# Patient Record
Sex: Male | Born: 1955
Health system: Southern US, Community
[De-identification: ages and names within clinical notes are randomized; demographics above are authoritative.]

## PROBLEM LIST (undated history)

## (undated) DIAGNOSIS — I10 Essential (primary) hypertension: Secondary | ICD-10-CM

## (undated) DIAGNOSIS — E785 Hyperlipidemia, unspecified: Secondary | ICD-10-CM

## (undated) DIAGNOSIS — Z974 Presence of external hearing-aid: Secondary | ICD-10-CM

## (undated) DIAGNOSIS — G4730 Sleep apnea, unspecified: Secondary | ICD-10-CM

## (undated) DIAGNOSIS — T7840XA Allergy, unspecified, initial encounter: Secondary | ICD-10-CM

## (undated) DIAGNOSIS — M1990 Unspecified osteoarthritis, unspecified site: Secondary | ICD-10-CM

## (undated) DIAGNOSIS — G709 Myoneural disorder, unspecified: Secondary | ICD-10-CM

## (undated) HISTORY — DX: Hyperlipidemia, unspecified: E78.5

## (undated) HISTORY — DX: Myoneural disorder, unspecified: G70.9

## (undated) HISTORY — DX: Allergy, unspecified, initial encounter: T78.40XA

## (undated) HISTORY — DX: Unspecified osteoarthritis, unspecified site: M19.90

## (undated) HISTORY — DX: Essential (primary) hypertension: I10

## (undated) HISTORY — PX: CATARACT EXTRACTION: SUR2

## (undated) HISTORY — PX: TYMPANOSTOMY TUBE PLACEMENT: SHX32

## (undated) HISTORY — PX: COLONOSCOPY WITH PROPOFOL: SHX5780

---

## 1995-03-26 ENCOUNTER — Encounter: Payer: Self-pay | Admitting: Family Medicine

## 1995-03-26 LAB — CONVERTED CEMR LAB
Blood Glucose, Fasting: 92 mg/dL
RBC count: 4.7 10*6/uL
WBC, blood: 5.2 10*3/uL

## 1995-04-18 HISTORY — PX: VASECTOMY: SHX75

## 1999-06-10 ENCOUNTER — Encounter: Payer: Self-pay | Admitting: Family Medicine

## 1999-06-10 LAB — CONVERTED CEMR LAB: Blood Glucose, Fasting: 107 mg/dL

## 2001-09-13 ENCOUNTER — Encounter: Payer: Self-pay | Admitting: Family Medicine

## 2001-09-13 LAB — CONVERTED CEMR LAB: TSH: 1.16 microintl units/mL

## 2003-01-02 ENCOUNTER — Encounter: Payer: Self-pay | Admitting: Family Medicine

## 2003-04-18 HISTORY — PX: LASIK: SHX215

## 2004-06-09 ENCOUNTER — Ambulatory Visit: Payer: Self-pay | Admitting: Family Medicine

## 2004-06-09 LAB — CONVERTED CEMR LAB
PSA: 0.42 ng/mL
TSH: 1.58 microintl units/mL

## 2004-06-15 ENCOUNTER — Ambulatory Visit: Payer: Self-pay | Admitting: Family Medicine

## 2004-08-10 ENCOUNTER — Ambulatory Visit: Payer: Self-pay | Admitting: Family Medicine

## 2004-08-10 LAB — FECAL OCCULT BLOOD, GUAIAC: Fecal Occult Blood: NEGATIVE

## 2005-01-30 ENCOUNTER — Ambulatory Visit: Payer: Self-pay | Admitting: Family Medicine

## 2005-06-28 ENCOUNTER — Ambulatory Visit: Payer: Self-pay | Admitting: Family Medicine

## 2005-06-28 LAB — CONVERTED CEMR LAB
PSA: 0.48 ng/mL
TSH: 1.69 microintl units/mL

## 2005-07-03 ENCOUNTER — Ambulatory Visit: Payer: Self-pay | Admitting: Family Medicine

## 2006-07-10 ENCOUNTER — Ambulatory Visit: Payer: Self-pay | Admitting: Family Medicine

## 2006-07-10 LAB — CONVERTED CEMR LAB
Albumin: 3.9 g/dL (ref 3.5–5.2)
Bilirubin, Direct: 0.1 mg/dL (ref 0.0–0.3)
Creatinine,U: 183.8 mg/dL
GFR calc non Af Amer: 83 mL/min
Glucose, Bld: 108 mg/dL — ABNORMAL HIGH (ref 70–99)
HDL: 39.8 mg/dL (ref >39.0)
LDL Cholesterol: 117 mg/dL — ABNORMAL HIGH (ref 0–99)
Microalb, Ur: 0.8 mg/dL (ref 0.0–1.9)
PSA: 0.48 ng/mL
PSA: 0.48 ng/mL (ref 0.10–4.00)
Potassium: 4.3 meq/L (ref 3.5–5.1)
Sodium: 139 meq/L (ref 135–145)
TSH: 1.59 microintl units/mL (ref 0.35–5.50)
Total Bilirubin: 0.7 mg/dL (ref 0.3–1.2)
Triglycerides: 110 mg/dL (ref 0–149)
VLDL: 22 mg/dL (ref 0–40)

## 2006-07-12 ENCOUNTER — Ambulatory Visit: Payer: Self-pay | Admitting: Family Medicine

## 2007-02-19 ENCOUNTER — Encounter: Payer: Self-pay | Admitting: Family Medicine

## 2007-02-19 DIAGNOSIS — E785 Hyperlipidemia, unspecified: Secondary | ICD-10-CM | POA: Insufficient documentation

## 2007-02-19 DIAGNOSIS — I10 Essential (primary) hypertension: Secondary | ICD-10-CM | POA: Insufficient documentation

## 2007-02-19 DIAGNOSIS — J309 Allergic rhinitis, unspecified: Secondary | ICD-10-CM

## 2007-02-19 DIAGNOSIS — R7309 Other abnormal glucose: Secondary | ICD-10-CM | POA: Insufficient documentation

## 2007-09-05 ENCOUNTER — Ambulatory Visit: Payer: Self-pay | Admitting: Family Medicine

## 2007-09-05 LAB — CONVERTED CEMR LAB
ALT: 23 units/L (ref 0–53)
AST: 20 units/L (ref 0–37)
Alkaline Phosphatase: 87 units/L (ref 39–117)
Bilirubin, Direct: 0.1 mg/dL (ref 0.0–0.3)
CO2: 28 meq/L (ref 19–32)
Chloride: 102 meq/L (ref 96–112)
GFR calc non Af Amer: 74 mL/min
LDL Cholesterol: 132 mg/dL — ABNORMAL HIGH (ref 0–99)
PSA: 0.44 ng/mL (ref 0.10–4.00)
Potassium: 4.5 meq/L (ref 3.5–5.1)
Sodium: 138 meq/L (ref 135–145)
TSH: 1.35 microintl units/mL (ref 0.35–5.50)
Total Bilirubin: 0.6 mg/dL (ref 0.3–1.2)
Total CHOL/HDL Ratio: 5.7
Triglycerides: 86 mg/dL (ref 0–149)

## 2007-09-12 ENCOUNTER — Ambulatory Visit: Payer: Self-pay | Admitting: Family Medicine

## 2008-09-09 ENCOUNTER — Ambulatory Visit: Payer: Self-pay | Admitting: Family Medicine

## 2008-09-10 LAB — CONVERTED CEMR LAB
Albumin: 3.9 g/dL (ref 3.5–5.2)
BUN: 15 mg/dL (ref 6–23)
Basophils Absolute: 0 10*3/uL (ref 0.0–0.1)
CO2: 30 meq/L (ref 19–32)
Calcium: 8.9 mg/dL (ref 8.4–10.5)
Cholesterol: 167 mg/dL (ref 0–200)
Creatinine,U: 170.6 mg/dL
Eosinophils Absolute: 0.1 10*3/uL (ref 0.0–0.7)
GFR calc non Af Amer: 93.27 mL/min (ref >60)
Glucose, Bld: 97 mg/dL (ref 70–99)
HCT: 41.3 % (ref 39.0–52.0)
HDL: 40.3 mg/dL (ref >39.00)
Hemoglobin: 14.5 g/dL (ref 13.0–17.0)
Lymphocytes Relative: 30.8 % (ref 12.0–46.0)
Lymphs Abs: 1.8 10*3/uL (ref 0.7–4.0)
MCHC: 35.2 g/dL (ref 30.0–36.0)
Microalb Creat Ratio: 5.3 mg/g (ref 0.0–30.0)
Microalb, Ur: 0.9 mg/dL (ref 0.0–1.9)
Monocytes Relative: 8.9 % (ref 3.0–12.0)
Neutro Abs: 3.5 10*3/uL (ref 1.4–7.7)
Platelets: 219 10*3/uL (ref 150.0–400.0)
RDW: 12.5 % (ref 11.5–14.6)
Sodium: 142 meq/L (ref 135–145)
TSH: 1.93 microintl units/mL (ref 0.35–5.50)
Total Bilirubin: 0.7 mg/dL (ref 0.3–1.2)
VLDL: 22.8 mg/dL (ref 0.0–40.0)

## 2008-09-16 ENCOUNTER — Ambulatory Visit: Payer: Self-pay | Admitting: Family Medicine

## 2009-10-08 ENCOUNTER — Telehealth: Payer: Self-pay | Admitting: Family Medicine

## 2009-11-05 ENCOUNTER — Telehealth (INDEPENDENT_AMBULATORY_CARE_PROVIDER_SITE_OTHER): Payer: Self-pay | Admitting: *Deleted

## 2009-11-08 ENCOUNTER — Ambulatory Visit: Payer: Self-pay | Admitting: Family Medicine

## 2009-11-09 LAB — CONVERTED CEMR LAB
AST: 22 units/L (ref 0–37)
Albumin: 4 g/dL (ref 3.5–5.2)
BUN: 17 mg/dL (ref 6–23)
Calcium: 9.2 mg/dL (ref 8.4–10.5)
Cholesterol: 178 mg/dL (ref 0–200)
GFR calc non Af Amer: 88.32 mL/min (ref >60)
Glucose, Bld: 102 mg/dL — ABNORMAL HIGH (ref 70–99)
VLDL: 30.8 mg/dL (ref 0.0–40.0)

## 2009-11-11 ENCOUNTER — Encounter: Payer: Self-pay | Admitting: Gastroenterology

## 2009-11-11 ENCOUNTER — Ambulatory Visit: Payer: Self-pay | Admitting: Family Medicine

## 2009-11-11 LAB — CONVERTED CEMR LAB

## 2009-12-06 ENCOUNTER — Encounter (INDEPENDENT_AMBULATORY_CARE_PROVIDER_SITE_OTHER): Payer: Self-pay | Admitting: *Deleted

## 2009-12-09 ENCOUNTER — Ambulatory Visit: Payer: Self-pay | Admitting: Gastroenterology

## 2009-12-23 ENCOUNTER — Ambulatory Visit: Payer: Self-pay | Admitting: Gastroenterology

## 2009-12-27 ENCOUNTER — Encounter: Payer: Self-pay | Admitting: Gastroenterology

## 2010-05-17 NOTE — Letter (Signed)
Summary: Patient Notice- Polyp Results  [facility]  [address] [place], [place] [address]   Phone: [phone]  Fax: [phone]        December 27, 2009 MRN: [id number]    PINKNEY VENARD [address] [place], [place]  [address]    Dear Mr. Northway,  I am pleased to inform you that the colon polyp(s) removed during your recent colonoscopy was (were) found to be benign (no cancer detected) upon pathologic examination.  I recommend you have a repeat colonoscopy examination in 5 years to look for recurrent polyps, as having colon polyps increases your risk for having recurrent polyps or even colon cancer in the future.  Should you develop new or worsening symptoms of abdominal pain, bowel habit changes or bleeding from the rectum or bowels, please schedule an evaluation with either your primary care physician or with me.  Continue treatment plan as outlined the day of your exam.  Please call us if you are having persistent problems or have questions about your condition that have not been fully answered at this time.  Sincerely,  Meryl Dare MD [facility]  This letter has been electronically signed by your physician.  Appended Document: Patient Notice- Polyp Results letter mailed.

## 2010-05-17 NOTE — Assessment & Plan Note (Signed)
Summary: cpx and transfer from schaller/alc   Vital Signs:  Patient profile:   55 year old male Height:      67.50 inches Weight:      262.75 pounds BMI:     40.69 Temp:     98.1 degrees F oral Pulse rate:   84 / minute Pulse rhythm:   regular BP sitting:   142 / 90  (left arm) Cuff size:   large  Vitals Entered By: Janee Morn CMA (2009-11-18 11:25 AM) CC: CPx and transfer from Dr. Hetty Ely, Preventive Care   History of Present Illness: CPE= see plan.  Hypertension:      Using medication without problems or lightheadedness: yes Chest pain with exertion:no Edema:no Short of breath:no Average home BPs:not checked Other issues: none  labs reviewed with patient.  d/w patient re: hyperglycemia.   Allergies: 1)  ! Erythromycin  Past History:  Past Medical History: Last updated: 02/19/2007 Allergic rhinitis:(03-04-1995) Hyperlipidemia(05/1999) Hypertension:(08/2001)  Past Surgical History: Last updated: 09/16/2008 VASECTOMY (DR SCHALLER) 04/1995 CARDIOLITE NML EF 64%:(11/15/2001)  Family History: Last updated: 11-18-2009 Father:DECEASED 38 YOA, CAD, STROKE,PROSTATE CANCER  Mother: A 90 Htn Chol   BROTHER A 68  KNEE SURGERY  ELEVATED PSA SISTER A 64  Smoker COPD on chronic steroids SISTER A 51 CV: + CAD FATHER, FATHERS BROTHER AORTIC ANEURYSM HBP: + FATHER DIABETES: +UNCLE, + PGF GOUT/ARTHRITIS: CANCER: + FATHER PROSTATE  Social History: Last updated: 2009/11/18 Marital Status: Married Research officer, 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 no illicits prev golfer  Risk Factors: Alcohol Use: <1 (09/16/2008) Caffeine Use: 4 (09/16/2008) Exercise: no (09/16/2008)  Risk Factors: Smoking Status: never (09/16/2008) Passive Smoke Exposure: no (09/16/2008)  Family  History: Reviewed history from 09/16/2008 and no changes required. Father:DECEASED 39 YOA, CAD, STROKE,PROSTATE CANCER  Mother: A 90 Htn Chol   BROTHER A 68  KNEE SURGERY  ELEVATED PSA SISTER A 64  Smoker COPD on chronic steroids SISTER A 51 CV: + CAD FATHER, FATHERS BROTHER AORTIC ANEURYSM HBP: + FATHER DIABETES: +UNCLE, + PGF GOUT/ARTHRITIS: CANCER: + FATHER PROSTATE  Social History: Reviewed history from 02/19/2007 and no changes required. 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 no illicits prev golfer  Review of Systems       See HPI.  Otherwise negative.    Physical Exam  General:  GEN: nad, alert and oriented HEENT: mucous membranes moist NECK: supple w/o LA CV: rrr.  no murmur PULM: ctab, no inc wob ABD: soft, +bs EXT: no edema SKIN: no acute rash  Rectal:  No external abnormalities noted. Normal sphincter tone. No rectal masses or tenderness.  Stool heme neg by MD Prostate:  Prostate gland firm and smooth, no enlargement, nodularity, tenderness, mass, asymmetry or induration.   Impression & Recommendations:  Problem # 1:  HEALTH MAINTENANCE EXAM (ICD-V70.0) See below.  flu shot encouraged.   Problem # 2:  HYPERTENSION (ICD-401.9) No change in meds.  D/w patient UE:AVWUJW and exercise.  His updated medication list for this problem includes:    Toprol Xl 50 Mg Tb24 (Metoprolol succinate) .Marland Kitchen... 1 by mouth qday  Problem # 3:  HYPERGLYCEMIA (ICD-790.29)  Follow yearly and patient to work on weight via exercise.   Problem # 4:  ALLERGIC RHINITIS (ICD-477.9) No change in meds.  controlled.  His updated medication list for this problem includes:    Fluticasone Propionate 50 Mcg/act Susp (Fluticasone propionate) ..... One puff to each nostril  daily  Complete Medication List: 1)  Fluticasone Propionate 50 Mcg/act Susp (Fluticasone propionate) .... One puff to each nostril daily 2)  Toprol Xl 50 Mg Tb24 (Metoprolol succinate) .Marland Kitchen.. 1 by mouth qday  Other Orders: Gastroenterology Referral (GI)  Colorectal Screening:  Current Recommendations:    Hemoccult: NEG X 1 today    Colonoscopy recommended: scheduled with G.I.  PSA Screening:    PSA: 0.53  (11/08/2009)    Reviewed PSA screening recommendations: PSA ordered  Immunization & Chemoprophylaxis:    Tetanus vaccine: Td  (09/30/2001)  Patient Instructions: 1)  Take care. Glad to see you today.  I would recheck your sugar and other labs before a physical next year.  See Aram Beecham about your referral before your leave today.   Work on your exercise routine.   Prescriptions: TOPROL XL 50 MG  TB24 (METOPROLOL SUCCINATE) 1 by mouth qday  #90 x 3   Entered and Authorized by:   Crawford Givens MD   Signed by:   Crawford Givens MD on 11/11/2009   Method used:   Electronically to        [facility] [address] (retail)       [address] [place], [place]  [address]       Ph: [phone]       Fax: [phone]   RxID:   [id number] FLUTICASONE PROPIONATE 50 MCG/ACT  SUSP (FLUTICASONE PROPIONATE) ONE PUFF TO EACH NOSTRIL DAILY  #3 x 3   Entered and Authorized by:   Crawford Givens MD   Signed by:   Crawford Givens MD on 11/11/2009   Method used:   Electronically to        [facility] [address] (retail)       [address] [place], [place]  [address]       Ph: [phone]       Fax: [phone]   RxID:   [id number]   Current Allergies (reviewed today): ! ERYTHROMYCIN      Prevention & Chronic Care Immunizations   Influenza vaccine: Not documented   Influenza vaccine due: 12/16/2009    Tetanus booster: 09/30/2001: Td    Pneumococcal vaccine: Not documented   Pneumococcal vaccine due:  02/15/2019  Colorectal Screening   Hemoccult: Negative  (08/10/2004)   Hemoccult action/deferral: NEG X 1 today  (11/11/2009)    Colonoscopy: Not documented   Colonoscopy action/deferral: scheduled with G.I.  (11/11/2009)  Other Screening   PSA: 0.53  (11/08/2009)   PSA action/deferral: PSA ordered  (11/11/2009)   Smoking status: never  (09/16/2008)  Lipids   Total Cholesterol: 178  (11/08/2009)   LDL: 109  (11/08/2009)   LDL Direct: Not documented   HDL: 38.70  (11/08/2009)   Triglycerides: 154.0  (11/08/2009)    SGOT (AST): 22  (11/08/2009)   SGPT (ALT): 24  (11/08/2009)   Alkaline phosphatase: 75  (11/08/2009)   Total bilirubin: 0.7  (11/08/2009)    Lipid flowsheet reviewed?: Yes  Hypertension   Last Blood Pressure: 142 /  90  (11/11/2009)   Serum creatinine: 0.9  (11/08/2009)   Serum potassium 4.2  (11/08/2009)    Hypertension flowsheet reviewed?: Yes  Self-Management Support :    Hypertension self-management support: Not documented    Lipid self-management support: Not documented

## 2010-05-17 NOTE — Letter (Signed)
Summary: [facility] Instructions  [facility]  [address] [place], [place] [address]   Phone: [phone]  Fax: [phone]       LINKEN MCGLOTHEN    1955-06-24    MRN: [id number]        Procedure Day Dorna Bloom:  Lenor Coffin  12/23/09     Arrival Time:  9:30AM     Procedure Time:  10:30AM     Location of Procedure:                    _ X_  [facility] (4th Floor)                       PREPARATION FOR COLONOSCOPY WITH MOVIPREP   Starting 5 days prior to your procedure 12/18/09 do not eat nuts, seeds, popcorn, corn, beans, peas,  salads, or any raw vegetables.  Do not take any fiber supplements (e.g. Metamucil, Citrucel, and Benefiber).  THE DAY BEFORE YOUR PROCEDURE         DATE: 12/22/09  DAY: WEDNESDAY  1.  Drink clear liquids the entire day-NO SOLID FOOD  2.  Do not drink anything colored red or purple.  Avoid juices with pulp.  No orange juice.  3.  Drink at least 64 oz. (8 glasses) of fluid/clear liquids during the day to prevent dehydration and help the prep work efficiently.  CLEAR LIQUIDS INCLUDE: Water Jello Ice Popsicles Tea (sugar ok, no milk/cream) Powdered fruit flavored drinks Coffee (sugar ok, no milk/cream) Gatorade Juice: apple, white grape, white cranberry  Lemonade Clear bullion, consomm, broth Carbonated beverages (any kind) Strained chicken noodle soup Hard Candy                             4.  In the morning, mix first dose of MoviPrep solution:    Empty 1 Pouch A and 1 Pouch B into the disposable container    Add lukewarm drinking water to the top line of the container. Mix to dissolve    Refrigerate (mixed solution should be used within 24 hrs)  5.  Begin drinking the prep at 5:00 p.m. The MoviPrep container is divided by 4 marks.   Every 15 minutes drink the solution down to the next mark (approximately 8 oz) until the full liter is complete.   6.  Follow completed prep with 16 oz of clear liquid of your choice (Nothing  red or purple).  Continue to drink clear liquids until bedtime.  7.  Before going to bed, mix second dose of MoviPrep solution:    Empty 1 Pouch A and 1 Pouch B into the disposable container    Add lukewarm drinking water to the top line of the container. Mix to dissolve    Refrigerate  THE DAY OF YOUR PROCEDURE      DATE: 12/23/09  DAY: THURSDAY  Beginning at 5:30AM (5 hours before procedure):         1. Every 15 minutes, drink the solution down to the next mark (approx 8 oz) until the full liter is complete.  2. Follow completed prep with 16 oz. of clear liquid of your choice.    3. You may drink clear liquids until 8:30AM (2 HOURS BEFORE PROCEDURE).   MEDICATION INSTRUCTIONS  Unless otherwise instructed, you should take regular prescription medications with a small sip of water   as early as possible the morning of  your procedure.           OTHER INSTRUCTIONS  You will need a responsible adult at least 57 years of age to accompany you and drive you home.   This person must remain in the waiting room during your procedure.  Wear loose fitting clothing that is easily removed.  Leave jewelry and other valuables at home.  However, you may wish to bring a book to read or  an iPod/MP3 player to listen to music as you wait for your procedure to start.  Remove all body piercing jewelry and leave at home.  Total time from sign-in until discharge is approximately 2-3 hours.  You should go home directly after your procedure and rest.  You can resume normal activities the  day after your procedure.  The day of your procedure you should not:   Drive   Make legal decisions   Operate machinery   Drink alcohol   Return to work  You will receive specific instructions about eating, activities and medications before you leave.    The above instructions have been reviewed and explained to me by   Clide Cliff, RN_____________________    I fully understand and can  verbalize these instructions _____________________________ Date _________

## 2010-05-17 NOTE — Progress Notes (Signed)
Summary: congrats  Phone Note Call from Patient Call back at Home Phone [phone]   Caller: Patient Call For: Shaune Leeks MD Summary of Call: Patient called to set up appt. with Dr. Para March for transfer and cpx. He wanted me to tell you congrats and happy retirement on his behalf.  Initial call taken by: Melody Comas,  October 08, 2009 9:34 AM  Follow-up for Phone Call        Noted. Follow-up by: Shaune Leeks MD,  October 10, 2009 1:44 PM

## 2010-05-17 NOTE — Procedures (Signed)
Summary: Colonoscopy  Patient: Lot Medford Note: All result statuses are Final unless otherwise noted.  Tests: (1) Colonoscopy (COL)   COL Colonoscopy           DONE     [facility]     [address]     [place], [place]  [address]           COLONOSCOPY PROCEDURE REPORT           PATIENT:  Dustin, [name]  MR#:  [id number]     BIRTHDATE:  Aug 16, 1955, 53 yrs. old  GENDER:  male     ENDOSCOPIST:  Judie Petit T. Russella Dar, MD, [facility]     Referred by:  Crawford Givens, M.D.     PROCEDURE DATE:  12/23/2009     PROCEDURE:  Colonoscopy with snare polypectomy     ASA CLASS:  Class II     INDICATIONS:  1) Routine Risk Screening     MEDICATIONS:   Fentanyl 75 mcg IV, Versed 7 mg IV     DESCRIPTION OF PROCEDURE:   After the risks benefits and     alternatives of the procedure were thoroughly explained, informed     consent was obtained.  Digital rectal exam was performed and     revealed no abnormalities.   The LB PCF-[id number] endoscope     was introduced through the anus and advanced to the cecum, which     was identified by both the appendix and ileocecal valve, without     limitations.  The quality of the prep was excellent, using     MoviPrep.  The instrument was then slowly withdrawn as the colon     was fully examined.     <<PROCEDUREIMAGES>>     FINDINGS:  Two polyps were found in the sigmoid colon. They were 4     - 5 mm in size. Polyps were snared without cautery. Retrieval was     successful. Moderate diverticulosis was found in the sigmoid to     descending colon.  A normal appearing cecum, ileocecal valve, and     appendiceal orifice were identified. The ascending, hepatic     flexure, transverse, splenic flexure, and rectum appeared     unremarkable. Retroflexed views in the rectum revealed no     abnormalities.  The time to cecum =  2.75  minutes. The scope was     then withdrawn (time =  11.25  min) from the patient and the     procedure completed.     COMPLICATIONS:   None           ENDOSCOPIC IMPRESSION:     1) 4 - 5 mm Two polyps in the sigmoid colon     2) Moderate diverticulosis in the sigmoid to descending colon           RECOMMENDATIONS:     1) Await pathology results     2) High fiber diet with liberal fluid intake.     3) If the polyps are adenomatous (pre-cancerous), you will need     a repeat colonoscopy in 5 years. Otherwise  continue to follow     colorectal cancer screening for "routine risk" patients with     colonoscopy in 10 years.           Venita Lick. Russella Dar, MD, [facility]           n.     eSIGNED:   Venita Lick. Russella Dar at  12/23/2009 11:42 AM           Nathanial Rancher, [id number]  Note: An exclamation mark (!) indicates a result that was not dispersed into the flowsheet. Document Creation Date: 12/23/2009 11:43 AM _______________________________________________________________________  (1) Order result status: Final Collection or observation date-time: 12/23/2009 11:37 Requested date-time:  Receipt date-time:  Reported date-time:  Referring Physician:   Ordering Physician: Claudette Head [phone]) Specimen Source:  Source: Launa Grill Order Number: [id number] Lab site:   Appended Document: Colonoscopy     Procedures Next Due Date:    Colonoscopy: 12/2014

## 2010-05-17 NOTE — Letter (Signed)
Summary: Previsit letter  [facility]  [address] [place], [place] [address]   Phone: [phone]  Fax: [phone]       11/11/2009 MRN: [id number]  Dustin [name] [address] [place], [place]  [address]  Dear [name],  Welcome to the Gastroenterology Division at [facility].    You are scheduled to see a nurse for your pre-procedure visit on 12-09-09 at 4pm on the 3rd floor at [facility], [address]  We ask that you try to arrive at our office 15 minutes prior to your appointment time to allow for check-in.  Your nurse visit will consist of discussing your medical and surgical history, your immediate family medical history, and your medications.    Please bring a complete list of all your medications or, if you prefer, bring the medication bottles and we will list them.  We will need to be aware of both prescribed and over the counter drugs.  We will need to know exact dosage information as well.  If you are on blood thinners (Coumadin, Plavix, Aggrenox, Ticlid, etc.) please call our office today/prior to your appointment, as we need to consult with your physician about holding your medication.   Please be prepared to read and sign documents such as consent forms, a financial agreement, and acknowledgement forms.  If necessary, and with your consent, a friend or relative is welcome to sit-in on the nurse visit with you.  Please bring your insurance card so that we may make a copy of it.  If your insurance requires a referral to see a specialist, please bring your referral form from your primary care physician.  No co-pay is required for this nurse visit.     If you cannot keep your appointment, please call [phone] to cancel or reschedule prior to your appointment date.  This allows [place] the opportunity to schedule an appointment for another patient in need of care.    Thank you for choosing [facility] for your medical needs.  We  appreciate the opportunity to care for you.  Please visit [place] at our website  to learn more about our practice.                     Sincerely.                                                                                                                   The Gastroenterology Division

## 2010-05-17 NOTE — Miscellaneous (Signed)
Summary: DIRECT COLON SCREEN-AGE/YF  Clinical Lists Changes  Medications: Added new medication of MOVIPREP 100 GM  SOLR (PEG-KCL-NACL-NASULF-NA ASC-C) As directed - Signed Rx of MOVIPREP 100 GM  SOLR (PEG-KCL-NACL-NASULF-NA ASC-C) As directed;  #1 x 0;  Signed;  Entered by: Clide Cliff RN;  Authorized by: Meryl Dare MD [facility];  Method used: Electronically to [facility] [address] [place], [place], [place]  [address], Ph: [phone], Fax: [phone] Observations: Added new observation of ALLERGY REV: Done (12/09/2009 16:00)    Prescriptions: MOVIPREP 100 GM  SOLR (PEG-KCL-NACL-NASULF-NA ASC-C) As directed  #1 x 0   Entered by:   Clide Cliff RN   Authorized by:   Meryl Dare MD [facility]   Signed by:   Clide Cliff RN on 12/09/2009   Method used:   Electronically to        [facility] [address] (retail)       [address] [place], [place]  [address]       Ph: [phone]       Fax: [phone]   RxID:   [id number]

## 2010-05-17 NOTE — Progress Notes (Signed)
----   Converted from flag ---- ---- 11/05/2009 12:15 PM, Crawford Givens MD wrote: psa v76.44 cmet/lipid 401.1  ---- 11/05/2009 11:42 AM, Mills Koller wrote: Patient is scheduled for a cpx w/ you. I need lab orders w/dx, Please. Thanks, Terri ------------------------------

## 2010-09-02 ENCOUNTER — Encounter: Payer: Self-pay | Admitting: Family Medicine

## 2010-09-05 ENCOUNTER — Encounter: Payer: Self-pay | Admitting: Family Medicine

## 2010-09-05 ENCOUNTER — Ambulatory Visit (INDEPENDENT_AMBULATORY_CARE_PROVIDER_SITE_OTHER): Payer: Managed Care, Other (non HMO) | Admitting: Family Medicine

## 2010-09-05 VITALS — BP 130/86 | HR 84 | Temp 98.0°F | Wt 263.1 lb

## 2010-09-05 DIAGNOSIS — R350 Frequency of micturition: Secondary | ICD-10-CM

## 2010-09-05 DIAGNOSIS — R300 Dysuria: Secondary | ICD-10-CM

## 2010-09-05 LAB — POCT URINALYSIS DIPSTICK
Bilirubin, UA: NEGATIVE
Leukocytes, UA: NEGATIVE
Nitrite, UA: NEGATIVE
Urobilinogen, UA: NEGATIVE
pH, UA: 6

## 2010-09-05 NOTE — Assessment & Plan Note (Addendum)
Hold on meds as exam is benign and check ucx.  D/w pt about caffeine.  Will recheck u/a if ucx negative, esp if sx continue.

## 2010-09-05 NOTE — Progress Notes (Signed)
Frequent urination.  Going on intermittently.  Some frequency and urgency.  Occ mild burning.  Occ nocturia.  It was more noticeable about 2 weeks ago.  He cut down on coffee with some relief of sx in the meantime.    Meds, vitals, and allergies reviewed.   ROS: See HPI.  Otherwise, noncontributory.  nad ncat Mmm rrr ctab abd soft, not ttp, no rebound Prostate gland firm and smooth, no sig enlargement, no nodularity, tenderness, mass, asymmetry or induration.  U/a reviewed.

## 2010-09-05 NOTE — Patient Instructions (Signed)
We'll contact you with your lab report. Take care.  Let us know if you have other concerns.

## 2010-09-13 ENCOUNTER — Ambulatory Visit: Payer: Managed Care, Other (non HMO) | Admitting: Family Medicine

## 2010-09-22 ENCOUNTER — Ambulatory Visit (INDEPENDENT_AMBULATORY_CARE_PROVIDER_SITE_OTHER): Payer: Managed Care, Other (non HMO) | Admitting: Family Medicine

## 2010-09-22 DIAGNOSIS — R300 Dysuria: Secondary | ICD-10-CM

## 2010-09-22 DIAGNOSIS — R358 Other polyuria: Secondary | ICD-10-CM

## 2010-09-22 LAB — POCT URINALYSIS DIPSTICK
Bilirubin, UA: NEGATIVE
Ketones, UA: NEGATIVE
Spec Grav, UA: 1.02
pH, UA: 6

## 2010-09-22 NOTE — Progress Notes (Signed)
  Subjective:    Patient ID: Dustin [name], male    DOB: 1956-01-16, 55 y.o.   MRN: [id number]  HPI    Review of Systems     Objective:   Physical Exam        Assessment & Plan:  Will await send out micro.

## 2010-09-22 NOTE — Progress Notes (Signed)
UA done and urine sent out for micro as directed.

## 2010-09-22 NOTE — Progress Notes (Signed)
Addended by: Baldomero Lamy on: 09/22/2010 10:11 AM   Modules accepted: Orders

## 2010-09-23 LAB — URINALYSIS, MICROSCOPIC ONLY
Bacteria, UA: NONE SEEN
Casts: NONE SEEN

## 2010-10-15 ENCOUNTER — Other Ambulatory Visit: Payer: Self-pay | Admitting: *Deleted

## 2010-10-15 MED ORDER — METOPROLOL SUCCINATE ER 50 MG PO TB24: 50.0000 mg | ORAL_TABLET | Freq: Every day | ORAL | Status: DC

## 2010-11-09 ENCOUNTER — Other Ambulatory Visit: Payer: Self-pay | Admitting: Family Medicine

## 2010-11-09 DIAGNOSIS — Z125 Encounter for screening for malignant neoplasm of prostate: Secondary | ICD-10-CM

## 2010-11-09 DIAGNOSIS — I10 Essential (primary) hypertension: Secondary | ICD-10-CM

## 2010-11-11 ENCOUNTER — Other Ambulatory Visit: Payer: Self-pay

## 2010-11-11 ENCOUNTER — Encounter: Payer: Managed Care, Other (non HMO) | Admitting: Family Medicine

## 2010-11-15 ENCOUNTER — Encounter: Payer: Self-pay | Admitting: Family Medicine

## 2010-11-15 ENCOUNTER — Other Ambulatory Visit (INDEPENDENT_AMBULATORY_CARE_PROVIDER_SITE_OTHER): Payer: Managed Care, Other (non HMO) | Admitting: Family Medicine

## 2010-11-15 DIAGNOSIS — Z125 Encounter for screening for malignant neoplasm of prostate: Secondary | ICD-10-CM

## 2010-11-15 DIAGNOSIS — I10 Essential (primary) hypertension: Secondary | ICD-10-CM

## 2010-11-15 LAB — COMPREHENSIVE METABOLIC PANEL
Albumin: 4 g/dL (ref 3.5–5.2)
Alkaline Phosphatase: 85 U/L (ref 39–117)
CO2: 27 mEq/L (ref 19–32)
Chloride: 101 mEq/L (ref 96–112)
Glucose, Bld: 98 mg/dL (ref 70–99)
Potassium: 4.3 mEq/L (ref 3.5–5.1)
Sodium: 137 mEq/L (ref 135–145)
Total Protein: 6.8 g/dL (ref 6.0–8.3)

## 2010-11-15 LAB — PSA: PSA: 0.51 ng/mL (ref 0.10–4.00)

## 2010-11-15 LAB — LIPID PANEL
Cholesterol: 157 mg/dL (ref 0–200)
LDL Cholesterol: 99 mg/dL (ref 0–99)
Triglycerides: 82 mg/dL (ref 0.0–149.0)

## 2010-11-18 ENCOUNTER — Ambulatory Visit (INDEPENDENT_AMBULATORY_CARE_PROVIDER_SITE_OTHER): Payer: Managed Care, Other (non HMO) | Admitting: Family Medicine

## 2010-11-18 ENCOUNTER — Encounter: Payer: Self-pay | Admitting: Family Medicine

## 2010-11-18 DIAGNOSIS — Z0000 Encounter for general adult medical examination without abnormal findings: Secondary | ICD-10-CM

## 2010-11-18 DIAGNOSIS — Z8042 Family history of malignant neoplasm of prostate: Secondary | ICD-10-CM

## 2010-11-18 DIAGNOSIS — E785 Hyperlipidemia, unspecified: Secondary | ICD-10-CM

## 2010-11-18 DIAGNOSIS — I10 Essential (primary) hypertension: Secondary | ICD-10-CM

## 2010-11-18 MED ORDER — METOPROLOL SUCCINATE ER 50 MG PO TB24: 50.0000 mg | ORAL_TABLET | Freq: Every day | ORAL | Status: DC

## 2010-11-18 MED ORDER — FLUTICASONE PROPIONATE 50 MCG/ACT NA SUSP: NASAL | Status: DC

## 2010-11-18 NOTE — Progress Notes (Signed)
CPE- See plan.  Routine anticipatory guidance given to patient.  See health maintenance.  He's going to work on exercise this fall.   Hypertension:    Using medication without problems or lightheadedness: yes Chest pain with exertion:no Edema:no Short of breath:no  Prev dysuria resolved.  Feeling well.    PMH and SH reviewed  Meds, vitals, and allergies reviewed.   ROS: See HPI.  Otherwise negative.    GEN: nad, alert and oriented HEENT: mucous membranes moist NECK: supple w/o LA CV: rrr. PULM: ctab, no inc wob ABD: soft, +bs EXT: no edema SKIN: no acute rash Prostate gland firm and smooth, no enlargement, nodularity, tenderness, mass, asymmetry or induration.

## 2010-11-18 NOTE — Patient Instructions (Addendum)
Check with your insurance to see if they will cover the shingles shot. I would get a flu shot each fall.   Take care and try to walk more this fall.  Recheck labs at physical next year.  Glad to see you today.

## 2010-11-19 ENCOUNTER — Encounter: Payer: Self-pay | Admitting: Family Medicine

## 2010-11-19 NOTE — Assessment & Plan Note (Signed)
At goal.  

## 2010-11-19 NOTE — Assessment & Plan Note (Signed)
Colonoscopy up to date.  D/w pt about diet/exercise/flu shot.  Tetanus up to date.  Healthy habits encouraged.  Labs d/w pt.

## 2010-11-19 NOTE — Assessment & Plan Note (Signed)
PSA and exam wnl.  Recheck yearly.

## 2010-11-19 NOTE — Assessment & Plan Note (Signed)
Controlled , no change in meds 

## 2011-03-13 ENCOUNTER — Other Ambulatory Visit: Payer: Self-pay | Admitting: Family Medicine

## 2011-03-13 NOTE — Telephone Encounter (Signed)
E-Scribing Error.  Med phoned to pharmacy.

## 2011-03-13 NOTE — Telephone Encounter (Signed)
E-Scribing Error.  Med phoned to pharmacy. 

## 2011-03-29 ENCOUNTER — Other Ambulatory Visit: Payer: Self-pay | Admitting: *Deleted

## 2011-03-29 MED ORDER — METOPROLOL SUCCINATE ER 50 MG PO TB24: 50.0000 mg | ORAL_TABLET | Freq: Every day | ORAL | Status: DC

## 2011-12-05 ENCOUNTER — Other Ambulatory Visit: Payer: Self-pay | Admitting: Family Medicine

## 2011-12-05 DIAGNOSIS — E7800 Pure hypercholesterolemia, unspecified: Secondary | ICD-10-CM

## 2011-12-05 DIAGNOSIS — Z8042 Family history of malignant neoplasm of prostate: Secondary | ICD-10-CM

## 2011-12-06 ENCOUNTER — Telehealth: Payer: Self-pay

## 2011-12-06 NOTE — Telephone Encounter (Signed)
I need to see him and talk about the fatigue before we draw a testosterone level.  We can discuss at OV.

## 2011-12-06 NOTE — Telephone Encounter (Signed)
Pt left v/m; pt scheduled CPX with Dr Para March on 12/14/11; pt feeling tired and would like testosterone level done also pt wants diet when sees Dr Para March on 08/29. Pt wants Dr Para March to discuss best direction for pt to go when seen 08/29. Pt does not need call back unless Dr Para March feels necessary; pt wanted Dr Para March to think about prior to his appt.

## 2011-12-11 ENCOUNTER — Other Ambulatory Visit (INDEPENDENT_AMBULATORY_CARE_PROVIDER_SITE_OTHER): Payer: Managed Care, Other (non HMO)

## 2011-12-11 DIAGNOSIS — Z8042 Family history of malignant neoplasm of prostate: Secondary | ICD-10-CM

## 2011-12-11 DIAGNOSIS — E7800 Pure hypercholesterolemia, unspecified: Secondary | ICD-10-CM

## 2011-12-11 LAB — LIPID PANEL
HDL: 41.4 mg/dL (ref >39.00)
LDL Cholesterol: 79 mg/dL (ref 0–99)
Total CHOL/HDL Ratio: 4
Triglycerides: 142 mg/dL (ref 0.0–149.0)

## 2011-12-11 LAB — COMPREHENSIVE METABOLIC PANEL
ALT: 29 U/L (ref 0–53)
AST: 24 U/L (ref 0–37)
Alkaline Phosphatase: 73 U/L (ref 39–117)
Creatinine, Ser: 1.1 mg/dL (ref 0.4–1.5)
GFR: 73.9 mL/min (ref >60.00)
Total Bilirubin: 0.6 mg/dL (ref 0.3–1.2)

## 2011-12-14 ENCOUNTER — Ambulatory Visit (INDEPENDENT_AMBULATORY_CARE_PROVIDER_SITE_OTHER): Payer: Managed Care, Other (non HMO) | Admitting: Family Medicine

## 2011-12-14 ENCOUNTER — Encounter: Payer: Self-pay | Admitting: Family Medicine

## 2011-12-14 VITALS — BP 136/86 | HR 74 | Temp 98.2°F | Ht 69.0 in | Wt 267.0 lb

## 2011-12-14 DIAGNOSIS — Z23 Encounter for immunization: Secondary | ICD-10-CM

## 2011-12-14 DIAGNOSIS — R7309 Other abnormal glucose: Secondary | ICD-10-CM

## 2011-12-14 DIAGNOSIS — I10 Essential (primary) hypertension: Secondary | ICD-10-CM

## 2011-12-14 DIAGNOSIS — Z0000 Encounter for general adult medical examination without abnormal findings: Secondary | ICD-10-CM

## 2011-12-14 MED ORDER — METOPROLOL SUCCINATE ER 50 MG PO TB24: 50.0000 mg | ORAL_TABLET | Freq: Every day | ORAL | Status: DC

## 2011-12-14 NOTE — Progress Notes (Signed)
CPE- See plan.  Routine anticipatory guidance given to patient.  See health maintenance. Tetanus 2013 Flu shot encouraged Advance directive.  D/w pt.  He'll talk to his family.   Colonoscopy 2011 PSA wnl.    Hypertension:    Using medication without problems or lightheadedness: yes Chest pain with exertion:no Edema:no Short of breath:no  Hyperglycemia.  Diet discussed, described by patient as "terrible".  He's working on getting more exercise.  Handout given.    PMH and SH reviewed  Meds, vitals, and allergies reviewed.   ROS: See HPI.  Otherwise negative.    GEN: nad, alert and oriented HEENT: mucous membranes moist NECK: supple w/o LA CV: rrr. PULM: ctab, no inc wob ABD: soft, +bs EXT: no edema SKIN: no acute rash

## 2011-12-14 NOTE — Patient Instructions (Addendum)
I would get a flu shot each fall.   Take care and good luck with your weight loss.  Avoid carbs/sweets.  Recheck in 1 year.

## 2011-12-15 NOTE — Assessment & Plan Note (Signed)
D/w pt about diet, weight, exercise.  Handout given.  He understood. Labs discussed.

## 2011-12-15 NOTE — Assessment & Plan Note (Signed)
D/w pt about diet, weight, exercise.  Continue current meds.

## 2011-12-15 NOTE — Assessment & Plan Note (Signed)
Routine anticipatory guidance given to patient.  See health maintenance. Tetanus 2013 Flu shot encouraged Advance directive.  D/w pt.  He'll talk to his family.   Colonoscopy 2011 PSA wnl.

## 2012-01-09 ENCOUNTER — Other Ambulatory Visit: Payer: Self-pay | Admitting: Family Medicine

## 2012-01-10 ENCOUNTER — Other Ambulatory Visit: Payer: Self-pay | Admitting: Family Medicine

## 2012-03-12 ENCOUNTER — Other Ambulatory Visit: Payer: Self-pay | Admitting: Family Medicine

## 2012-11-12 ENCOUNTER — Other Ambulatory Visit: Payer: Self-pay | Admitting: Family Medicine

## 2012-12-08 ENCOUNTER — Other Ambulatory Visit: Payer: Self-pay | Admitting: Family Medicine

## 2012-12-08 DIAGNOSIS — Z125 Encounter for screening for malignant neoplasm of prostate: Secondary | ICD-10-CM

## 2012-12-08 DIAGNOSIS — I10 Essential (primary) hypertension: Secondary | ICD-10-CM

## 2012-12-11 ENCOUNTER — Other Ambulatory Visit (INDEPENDENT_AMBULATORY_CARE_PROVIDER_SITE_OTHER): Payer: Managed Care, Other (non HMO)

## 2012-12-11 ENCOUNTER — Other Ambulatory Visit: Payer: Self-pay | Admitting: Family Medicine

## 2012-12-11 DIAGNOSIS — I10 Essential (primary) hypertension: Secondary | ICD-10-CM

## 2012-12-11 DIAGNOSIS — Z125 Encounter for screening for malignant neoplasm of prostate: Secondary | ICD-10-CM

## 2012-12-12 LAB — LIPID PANEL
Cholesterol: 153 mg/dL (ref 0–200)
HDL: 36.6 mg/dL — ABNORMAL LOW (ref >39.00)
VLDL: 25 mg/dL (ref 0.0–40.0)

## 2012-12-12 LAB — COMPREHENSIVE METABOLIC PANEL
CO2: 30 mEq/L (ref 19–32)
Calcium: 9.1 mg/dL (ref 8.4–10.5)
Chloride: 102 mEq/L (ref 96–112)
Creatinine, Ser: 1 mg/dL (ref 0.4–1.5)
GFR: 85.26 mL/min (ref >60.00)
Glucose, Bld: 96 mg/dL (ref 70–99)
Total Bilirubin: 0.5 mg/dL (ref 0.3–1.2)

## 2012-12-12 LAB — PSA: PSA: 0.4 ng/mL (ref 0.10–4.00)

## 2012-12-17 ENCOUNTER — Encounter: Payer: Self-pay | Admitting: Family Medicine

## 2012-12-17 ENCOUNTER — Ambulatory Visit (INDEPENDENT_AMBULATORY_CARE_PROVIDER_SITE_OTHER): Payer: Managed Care, Other (non HMO) | Admitting: Family Medicine

## 2012-12-17 VITALS — BP 138/80 | HR 80 | Temp 98.0°F | Ht 68.75 in | Wt 267.0 lb

## 2012-12-17 DIAGNOSIS — I10 Essential (primary) hypertension: Secondary | ICD-10-CM

## 2012-12-17 DIAGNOSIS — Z8042 Family history of malignant neoplasm of prostate: Secondary | ICD-10-CM

## 2012-12-17 DIAGNOSIS — Z0000 Encounter for general adult medical examination without abnormal findings: Secondary | ICD-10-CM

## 2012-12-17 MED ORDER — METOPROLOL SUCCINATE ER 50 MG PO TB24: 50.0000 mg | ORAL_TABLET | Freq: Every day | ORAL | Status: DC

## 2012-12-17 NOTE — Assessment & Plan Note (Signed)
PSA wnl. Feeling well. No LUTS.  PSA d/w pt.

## 2012-12-17 NOTE — Patient Instructions (Addendum)
I would get a flu shot each fall.   Take care.  Recheck in 1 year.  Glad to see you.

## 2012-12-17 NOTE — Assessment & Plan Note (Signed)
Controlled.  Diet discussed. He had lost some weight but gained it back. He needs more exercise, discussed. Wife is working on her diet concurrently.  Continue current meds.

## 2012-12-17 NOTE — Progress Notes (Signed)
CPE- See plan. Routine anticipatory guidance given to patient. See health maintenance.  Tetanus 2013  Flu shot encouraged, he'll likely do that at work.   Advance directive. D/w pt. He'll talk to his family. Would have his wife designated if he were incapacitated.   Colonoscopy 2011 PSA wnl.   Hypertension:  Using medication without problems or lightheadedness: yes  Chest pain with exertion:no  Edema:no  Short of breath:no   H/o hyperglycemia. Diet discussed.  He had lost some weight but gained it back.  He needs more exercise, discussed.  Wife is working on her diet.  PMH and SH reviewed   Meds, vitals, and allergies reviewed.   ROS: See HPI. Otherwise negative.   GEN: nad, alert and oriented  HEENT: mucous membranes moist  NECK: supple w/o LA  CV: rrr.  PULM: ctab, no inc wob  ABD: soft, +bs  EXT: no edema  SKIN: no acute rash

## 2012-12-17 NOTE — Assessment & Plan Note (Signed)
Routine anticipatory guidance given to patient. See health maintenance.  Tetanus 2013  Flu shot encouraged, he'll likely do that at work.  Advance directive. D/w pt. He'll talk to his family. Would have his wife designated if he were incapacitated.  Colonoscopy 2011  PSA wnl.  H/o hyperglycemia. Diet discussed. He had lost some weight but gained it back. He needs more exercise, discussed. Wife is working on her diet.

## 2013-02-18 ENCOUNTER — Encounter: Payer: Self-pay | Admitting: Family Medicine

## 2013-03-10 ENCOUNTER — Other Ambulatory Visit: Payer: Self-pay | Admitting: Family Medicine

## 2013-04-05 ENCOUNTER — Other Ambulatory Visit: Payer: Self-pay | Admitting: Family Medicine

## 2013-04-08 ENCOUNTER — Other Ambulatory Visit: Payer: Self-pay | Admitting: *Deleted

## 2013-05-05 ENCOUNTER — Other Ambulatory Visit: Payer: Self-pay | Admitting: Family Medicine

## 2013-06-10 ENCOUNTER — Other Ambulatory Visit: Payer: Self-pay | Admitting: Family Medicine

## 2013-08-09 ENCOUNTER — Other Ambulatory Visit: Payer: Self-pay | Admitting: Family Medicine

## 2013-09-10 ENCOUNTER — Other Ambulatory Visit: Payer: Self-pay | Admitting: Family Medicine

## 2013-09-13 ENCOUNTER — Other Ambulatory Visit: Payer: Self-pay | Admitting: Family Medicine

## 2013-10-13 ENCOUNTER — Encounter: Payer: Self-pay | Admitting: Family Medicine

## 2013-10-13 ENCOUNTER — Ambulatory Visit (INDEPENDENT_AMBULATORY_CARE_PROVIDER_SITE_OTHER): Payer: Managed Care, Other (non HMO) | Admitting: Family Medicine

## 2013-10-13 VITALS — BP 142/80 | HR 72 | Temp 97.5°F | Wt 266.0 lb

## 2013-10-13 DIAGNOSIS — I10 Essential (primary) hypertension: Secondary | ICD-10-CM

## 2013-10-13 MED ORDER — LISINOPRIL 10 MG PO TABS: 5.0000 mg | ORAL_TABLET | Freq: Every day | ORAL | Status: DC

## 2013-10-13 NOTE — Patient Instructions (Signed)
Add on 5mg  of lisinopril a day.  After about 1 week, if your BP is still >140/>90, then increase to 10mg  a day.  If lightheaded, then cut back your dose.  Recheck labs in about 2 weeks. Update me as needed.  Take care.

## 2013-10-13 NOTE — Progress Notes (Signed)
Pre visit review using our clinic review tool, if applicable. No additional management support is needed unless otherwise documented below in the visit note.  Hypertension:    Using medication without problems or lightheadedness: yes Chest pain with exertion:no Edema:occ BLE edema, stable per patient Short of breath:no Average home QIO:[id number] at home, on checks.  See scanned forms.   Pulse not elevated usually.  Sig social upheaval noted.    Meds, vitals, and allergies reviewed.   PMH and SH reviewed  ROS: See HPI.  Otherwise negative.    GEN: nad, alert and oriented HEENT: mucous membranes moist NECK: supple w/o LA CV: rrr. PULM: ctab, no inc wob ABD: soft, +bs EXT: trace BLE edema

## 2013-10-13 NOTE — Assessment & Plan Note (Signed)
Add on ACE for now.  D/w pt.  He'll uptitrate his dose if BP not controlled up to 10mg  a day, can update me as needed.  Recheck BMET in about 2 weeks.  Social upheaval and weight likely contribute.  He'll work on weight in the long run but this may help control BP in the meantime.  He agrees.  Condolences offered re: deaths of family members.  He's happy about his daughter starting at [employer] in the fall.

## 2013-10-14 ENCOUNTER — Telehealth: Payer: Self-pay | Admitting: Family Medicine

## 2013-10-14 NOTE — Telephone Encounter (Signed)
Relevant patient education assigned to patient using Emmi. ° °

## 2013-10-21 ENCOUNTER — Encounter: Payer: Self-pay | Admitting: Family Medicine

## 2013-10-30 ENCOUNTER — Ambulatory Visit: Payer: Managed Care, Other (non HMO) | Admitting: Family Medicine

## 2013-10-30 ENCOUNTER — Other Ambulatory Visit (INDEPENDENT_AMBULATORY_CARE_PROVIDER_SITE_OTHER): Payer: Managed Care, Other (non HMO)

## 2013-10-30 DIAGNOSIS — I10 Essential (primary) hypertension: Secondary | ICD-10-CM

## 2013-10-30 LAB — BASIC METABOLIC PANEL
BUN: 19 mg/dL (ref 6–23)
CALCIUM: 9.6 mg/dL (ref 8.4–10.5)
CO2: 27 mEq/L (ref 19–32)
Chloride: 105 mEq/L (ref 96–112)
Creatinine, Ser: 1 mg/dL (ref 0.4–1.5)
GFR: 85 mL/min (ref >60.00)
Glucose, Bld: 109 mg/dL — ABNORMAL HIGH (ref 70–99)
POTASSIUM: 4.1 meq/L (ref 3.5–5.1)
SODIUM: 139 meq/L (ref 135–145)

## 2013-10-31 ENCOUNTER — Encounter: Payer: Self-pay | Admitting: *Deleted

## 2013-12-09 ENCOUNTER — Other Ambulatory Visit: Payer: Self-pay | Admitting: Family Medicine

## 2014-01-25 ENCOUNTER — Other Ambulatory Visit: Payer: Self-pay | Admitting: Family Medicine

## 2014-01-25 DIAGNOSIS — Z125 Encounter for screening for malignant neoplasm of prostate: Secondary | ICD-10-CM

## 2014-01-25 DIAGNOSIS — I10 Essential (primary) hypertension: Secondary | ICD-10-CM

## 2014-01-30 ENCOUNTER — Other Ambulatory Visit (INDEPENDENT_AMBULATORY_CARE_PROVIDER_SITE_OTHER): Payer: Managed Care, Other (non HMO)

## 2014-01-30 DIAGNOSIS — Z0000 Encounter for general adult medical examination without abnormal findings: Secondary | ICD-10-CM

## 2014-01-30 DIAGNOSIS — I10 Essential (primary) hypertension: Secondary | ICD-10-CM

## 2014-01-30 DIAGNOSIS — Z125 Encounter for screening for malignant neoplasm of prostate: Secondary | ICD-10-CM

## 2014-01-30 LAB — LIPID PANEL
CHOLESTEROL: 161 mg/dL (ref 0–200)
HDL: 34.9 mg/dL — ABNORMAL LOW (ref >39.00)
LDL CALC: 109 mg/dL — AB (ref 0–99)
NonHDL: 126.1
Total CHOL/HDL Ratio: 5
Triglycerides: 87 mg/dL (ref 0.0–149.0)
VLDL: 17.4 mg/dL (ref 0.0–40.0)

## 2014-01-30 LAB — COMPREHENSIVE METABOLIC PANEL
ALT: 25 U/L (ref 0–53)
AST: 23 U/L (ref 0–37)
Albumin: 3.5 g/dL (ref 3.5–5.2)
Alkaline Phosphatase: 77 U/L (ref 39–117)
BILIRUBIN TOTAL: 0.8 mg/dL (ref 0.2–1.2)
BUN: 16 mg/dL (ref 6–23)
CO2: 29 mEq/L (ref 19–32)
CREATININE: 1 mg/dL (ref 0.4–1.5)
Calcium: 9.3 mg/dL (ref 8.4–10.5)
Chloride: 103 mEq/L (ref 96–112)
GFR: 82.93 mL/min (ref >60.00)
Glucose, Bld: 108 mg/dL — ABNORMAL HIGH (ref 70–99)
Potassium: 4.2 mEq/L (ref 3.5–5.1)
SODIUM: 138 meq/L (ref 135–145)
Total Protein: 7 g/dL (ref 6.0–8.3)

## 2014-01-30 LAB — PSA: PSA: 0.41 ng/mL (ref 0.10–4.00)

## 2014-02-06 ENCOUNTER — Encounter: Payer: Self-pay | Admitting: Family Medicine

## 2014-02-06 ENCOUNTER — Ambulatory Visit (INDEPENDENT_AMBULATORY_CARE_PROVIDER_SITE_OTHER): Payer: Managed Care, Other (non HMO) | Admitting: Family Medicine

## 2014-02-06 VITALS — BP 144/92 | HR 64 | Temp 98.0°F | Ht 68.75 in | Wt 266.0 lb

## 2014-02-06 DIAGNOSIS — Z0000 Encounter for general adult medical examination without abnormal findings: Secondary | ICD-10-CM

## 2014-02-06 DIAGNOSIS — Z7189 Other specified counseling: Secondary | ICD-10-CM

## 2014-02-06 DIAGNOSIS — I10 Essential (primary) hypertension: Secondary | ICD-10-CM

## 2014-02-06 MED ORDER — METOPROLOL SUCCINATE ER 50 MG PO TB24: 50.0000 mg | ORAL_TABLET | Freq: Every day | ORAL | Status: DC

## 2014-02-06 NOTE — Patient Instructions (Signed)
I would put the shingles vaccine off for now.  Check with your insurance to see if they will cover the shingles shot in the meantime.  Keep working on your diet and try to get some more exercise.  Take care.  Glad to see you.

## 2014-02-06 NOTE — Progress Notes (Signed)
Pre visit review using our clinic review tool, if applicable. No additional management support is needed unless otherwise documented below in the visit note.  CPE- See plan.  Routine anticipatory guidance given to patient.  See health maintenance. Tetanus 2013 Flu 2015 PNA due at 63.  Shingles shot- due at 81.  He can get it later on, new grandbaby recently born and he can delay it for now.   Colonoscopy 2011 PSA wnl, FH noted.   Diet and exercise d/w pt.  "not enough exercise."  Encouraged more.   Living will d/w pt.  Wife designated if patient were incapacitated.    Hypertension:    Using medication without problems or lightheadedness: yes Chest pain with exertion:no Edema:no Short of breath:no Average home BPs: usually 140/90. Off lisinopril.   He is getting laid off next month.  He is through the tough work situation with work and his BP improved.  He is either going to work elsewhere and/or go back to school.    PMH and SH reviewed  Meds, vitals, and allergies reviewed.   ROS: See HPI.  Otherwise negative.    GEN: nad, alert and oriented HEENT: mucous membranes moist NECK: supple w/o LA CV: rrr. PULM: ctab, no inc wob ABD: soft, +bs EXT: no edema SKIN: no acute rash

## 2014-02-08 DIAGNOSIS — Z7189 Other specified counseling: Secondary | ICD-10-CM | POA: Insufficient documentation

## 2014-02-08 NOTE — Assessment & Plan Note (Signed)
Routine anticipatory guidance given to patient.  See health maintenance. Tetanus 2013 Flu 2015 PNA due at 63.  Shingles shot- due at 78.  He can get it later on, new grandbaby recently born and he can delay it for now.   Colonoscopy 2011 PSA wnl, FH noted.   Diet and exercise d/w pt.  "not enough exercise."  Encouraged more.   Living will d/w pt.  Wife designated if patient were incapacitated.

## 2014-02-08 NOTE — Assessment & Plan Note (Signed)
Reasonable control on home checks. Continue meds as is, he'll work on diet/exercise/weight.  Recheck yearly here.  He can notify us if sig elevations in the meantime.

## 2014-10-01 ENCOUNTER — Ambulatory Visit (INDEPENDENT_AMBULATORY_CARE_PROVIDER_SITE_OTHER): Payer: BC Managed Care – PPO | Admitting: Family Medicine

## 2014-10-01 ENCOUNTER — Encounter: Payer: Self-pay | Admitting: Family Medicine

## 2014-10-01 VITALS — BP 154/88 | HR 62 | Temp 97.9°F | Wt 272.2 lb

## 2014-10-01 DIAGNOSIS — H6983 Other specified disorders of Eustachian tube, bilateral: Secondary | ICD-10-CM

## 2014-10-01 DIAGNOSIS — H6980 Other specified disorders of Eustachian tube, unspecified ear: Secondary | ICD-10-CM | POA: Insufficient documentation

## 2014-10-01 MED ORDER — FLUTICASONE PROPIONATE 50 MCG/ACT NA SUSP: 1.0000 | Freq: Every day | NASAL | Status: DC

## 2014-10-01 NOTE — Progress Notes (Signed)
Pre visit review using our clinic review tool, if applicable. No additional management support is needed unless otherwise documented below in the visit note.  B ears feel stopped up.  Hadn't been on flonase recently.  Is taking claritin w/o relief.  No FCNAVD.  He'll get better when at the beach but have sx return when he comes back. He's waiting for his ears to "pop".  Meds, vitals, and allergies reviewed.   ROS: See HPI.  Otherwise, noncontributory.  nad ncat Nasal exam slightly stuffy Sinuses not ttp OP wnl TM wnl B, no erythema.

## 2014-10-01 NOTE — Assessment & Plan Note (Signed)
Likely d/w pt.  Restart flonase and gently valsalva.  Fu prn.  He agrees.

## 2014-10-01 NOTE — Patient Instructions (Signed)
Gently try to pop your ears and use flonase 2 sprays per nostril.  Eustachian tube dysfunction.

## 2014-10-16 ENCOUNTER — Encounter: Payer: Self-pay | Admitting: Gastroenterology

## 2014-10-20 ENCOUNTER — Telehealth: Payer: Self-pay | Admitting: Family Medicine

## 2014-10-20 DIAGNOSIS — H6980 Other specified disorders of Eustachian tube, unspecified ear: Secondary | ICD-10-CM

## 2014-10-20 NOTE — Telephone Encounter (Signed)
I asked Rosaria Ferries to try get it as soon as she could.

## 2014-10-20 NOTE — Telephone Encounter (Signed)
Pt called to let you know that his hearing is not any better on right ear. He wanted to know what the next step for him is. ent referral or a different med.  He has been taking flonase as you directed  If ent referral needed he would like to go [place] he can go any day or time

## 2014-10-20 NOTE — Telephone Encounter (Signed)
Referral put in for ENT. Thanks.

## 2014-10-20 NOTE — Telephone Encounter (Signed)
Dustin [name] says this is driving him crazy and would like an appt as soon as possible.

## 2014-11-20 ENCOUNTER — Encounter: Payer: Self-pay | Admitting: Gastroenterology

## 2015-02-02 ENCOUNTER — Encounter: Payer: Self-pay | Admitting: Family Medicine

## 2015-02-02 NOTE — Telephone Encounter (Signed)
Please order labs needed for CPE as pt has a CPE scheduled for next week and will schedule lab only visit this week

## 2015-02-03 ENCOUNTER — Other Ambulatory Visit: Payer: Self-pay | Admitting: Family Medicine

## 2015-02-03 DIAGNOSIS — I10 Essential (primary) hypertension: Secondary | ICD-10-CM

## 2015-02-03 DIAGNOSIS — Z125 Encounter for screening for malignant neoplasm of prostate: Secondary | ICD-10-CM

## 2015-02-05 ENCOUNTER — Other Ambulatory Visit (INDEPENDENT_AMBULATORY_CARE_PROVIDER_SITE_OTHER): Payer: BC Managed Care – PPO

## 2015-02-05 DIAGNOSIS — Z125 Encounter for screening for malignant neoplasm of prostate: Secondary | ICD-10-CM

## 2015-02-05 DIAGNOSIS — I10 Essential (primary) hypertension: Secondary | ICD-10-CM | POA: Diagnosis not present

## 2015-02-05 LAB — LIPID PANEL
CHOL/HDL RATIO: 4
CHOLESTEROL: 162 mg/dL (ref 0–200)
HDL: 39.8 mg/dL (ref >39.00)
LDL Cholesterol: 98 mg/dL (ref 0–99)
NonHDL: 121.77
TRIGLYCERIDES: 120 mg/dL (ref 0.0–149.0)
VLDL: 24 mg/dL (ref 0.0–40.0)

## 2015-02-05 LAB — COMPREHENSIVE METABOLIC PANEL
ALBUMIN: 4.2 g/dL (ref 3.5–5.2)
ALT: 21 U/L (ref 0–53)
AST: 17 U/L (ref 0–37)
Alkaline Phosphatase: 87 U/L (ref 39–117)
BILIRUBIN TOTAL: 0.6 mg/dL (ref 0.2–1.2)
BUN: 26 mg/dL — ABNORMAL HIGH (ref 6–23)
CALCIUM: 9.4 mg/dL (ref 8.4–10.5)
CHLORIDE: 104 meq/L (ref 96–112)
CO2: 29 meq/L (ref 19–32)
Creatinine, Ser: 1.02 mg/dL (ref 0.40–1.50)
GFR: 78.92 mL/min (ref >60.00)
Glucose, Bld: 105 mg/dL — ABNORMAL HIGH (ref 70–99)
Potassium: 4.3 mEq/L (ref 3.5–5.1)
Sodium: 140 mEq/L (ref 135–145)
Total Protein: 6.8 g/dL (ref 6.0–8.3)

## 2015-02-05 LAB — PSA: PSA: 0.37 ng/mL (ref 0.10–4.00)

## 2015-02-09 ENCOUNTER — Ambulatory Visit (INDEPENDENT_AMBULATORY_CARE_PROVIDER_SITE_OTHER): Payer: BC Managed Care – PPO | Admitting: Family Medicine

## 2015-02-09 ENCOUNTER — Encounter: Payer: Self-pay | Admitting: Family Medicine

## 2015-02-09 VITALS — BP 138/88 | HR 60 | Temp 98.7°F | Ht 68.5 in | Wt 271.2 lb

## 2015-02-09 DIAGNOSIS — Z119 Encounter for screening for infectious and parasitic diseases, unspecified: Secondary | ICD-10-CM

## 2015-02-09 DIAGNOSIS — I10 Essential (primary) hypertension: Secondary | ICD-10-CM

## 2015-02-09 DIAGNOSIS — Z23 Encounter for immunization: Secondary | ICD-10-CM

## 2015-02-09 DIAGNOSIS — Z0000 Encounter for general adult medical examination without abnormal findings: Secondary | ICD-10-CM

## 2015-02-09 DIAGNOSIS — H6981 Other specified disorders of Eustachian tube, right ear: Secondary | ICD-10-CM

## 2015-02-09 MED ORDER — METOPROLOL SUCCINATE ER 50 MG PO TB24: 50.0000 mg | ORAL_TABLET | Freq: Every day | ORAL | Status: DC

## 2015-02-09 NOTE — Patient Instructions (Addendum)
Check with your insurance to see if they will cover the shingles shot. Exercise- walking would be great.   Take care.  Glad to see you.  Recheck labs in about 1 year before a physical.   Call the ENT clinic about follow up.

## 2015-02-09 NOTE — Progress Notes (Signed)
Pre visit review using our clinic review tool, if applicable. No additional management support is needed unless otherwise documented below in the visit note.  CPE- See plan.  Routine anticipatory guidance given to patient.  See health maintenance. Tetanus 2013 Flu 2016 PNA not due yet.  Shingles d/w pt.   Pt opts in for HCV screening.  D/w pt re: routine screening.   HIV screening d/w pt.   He opts in.  See future orders.  PSA wnl.  Dw pt.  FH noted.   Colonoscopy due in 2017.   Living will d/w pt.  Wife designated if patient were incapacitated.  Diet and exercise d/w pt.  Encouraged both.  Doing better with diet than exercise.    He was laid off last year.  He is back in school, on line.  That is affecting his lifestyle.  D/w pt.    Hypertension:  Using medication without problems or lightheadedness: yes Chest pain with exertion:no Edema:no Short of breath:no  He had R ear tube.  His hearing improved immediately.  Now with return of hearing loss on the R side.  He cannot pop his ears with valsalva now.  No ear drainage now, not for the last few weeks.   PMH and SH reviewed  Meds, vitals, and allergies reviewed.   ROS: See HPI.  Otherwise negative.    GEN: nad, alert and oriented HEENT: mucous membranes moist, no TM erythema, PE tube noted in R TM.  Weber louder on the R ear.   NECK: supple w/o LA CV: rrr. PULM: ctab, no inc wob ABD: soft, +bs EXT: no edema

## 2015-02-10 NOTE — Assessment & Plan Note (Signed)
Tetanus 2013  Flu 2016  PNA not due yet.  Shingles d/w pt.  Pt opts in for HCV screening. D/w pt re: routine screening.  HIV screening d/w pt. He opts in. See future orders.  PSA wnl. Dw pt. FH noted.  Colonoscopy due in 2017.  Living will d/w pt. Wife designated if patient were incapacitated.  Diet and exercise d/w pt. Encouraged both. Doing better with diet than exercise.

## 2015-02-10 NOTE — Assessment & Plan Note (Signed)
He has f/u with ENT pending.  Still with hearing intact on Weber testing, which is reassuring.  I'll await ENT input.  I don't know if the tube is obstructed or if he still ETD concurrently.  Okay for outpatient f/u.

## 2015-02-10 NOTE — Assessment & Plan Note (Signed)
Controlled, continue med, needs weight loss with diet and exercise.  He agrees.  Labs d/w pt.

## 2015-05-25 ENCOUNTER — Emergency Department
Admission: EM | Admit: 2015-05-25 | Discharge: 2015-05-25 | Disposition: A | Discharge status: Home / Self Care | Payer: BC Managed Care – PPO | Attending: Emergency Medicine | Admitting: Emergency Medicine

## 2015-05-25 ENCOUNTER — Encounter: Payer: Self-pay | Admitting: *Deleted

## 2015-05-25 ENCOUNTER — Emergency Department: Payer: BC Managed Care – PPO

## 2015-05-25 DIAGNOSIS — Z79899 Other long term (current) drug therapy: Secondary | ICD-10-CM | POA: Insufficient documentation

## 2015-05-25 DIAGNOSIS — H8110 Benign paroxysmal vertigo, unspecified ear: Secondary | ICD-10-CM | POA: Insufficient documentation

## 2015-05-25 DIAGNOSIS — Z7982 Long term (current) use of aspirin: Secondary | ICD-10-CM | POA: Insufficient documentation

## 2015-05-25 DIAGNOSIS — R42 Dizziness and giddiness: Secondary | ICD-10-CM | POA: Diagnosis present

## 2015-05-25 DIAGNOSIS — Z87891 Personal history of nicotine dependence: Secondary | ICD-10-CM | POA: Insufficient documentation

## 2015-05-25 DIAGNOSIS — I10 Essential (primary) hypertension: Secondary | ICD-10-CM | POA: Insufficient documentation

## 2015-05-25 LAB — CBC
HEMATOCRIT: 42.2 % (ref 40.0–52.0)
HEMOGLOBIN: 14.4 g/dL (ref 13.0–18.0)
MCH: 30.4 pg (ref 26.0–34.0)
MCHC: 34 g/dL (ref 32.0–36.0)
MCV: 89.4 fL (ref 80.0–100.0)
Platelets: 230 10*3/uL (ref 150–440)
RBC: 4.72 MIL/uL (ref 4.40–5.90)
RDW: 13.6 % (ref 11.5–14.5)
WBC: 9.7 10*3/uL (ref 3.8–10.6)

## 2015-05-25 LAB — BASIC METABOLIC PANEL
ANION GAP: 7 (ref 5–15)
BUN: 18 mg/dL (ref 6–20)
CHLORIDE: 105 mmol/L (ref 101–111)
CO2: 27 mmol/L (ref 22–32)
Calcium: 9.2 mg/dL (ref 8.9–10.3)
Creatinine, Ser: 0.98 mg/dL (ref 0.61–1.24)
GFR calc Af Amer: >60 mL/min (ref >60)
Glucose, Bld: 178 mg/dL — ABNORMAL HIGH (ref 65–99)
POTASSIUM: 3.7 mmol/L (ref 3.5–5.1)
SODIUM: 139 mmol/L (ref 135–145)

## 2015-05-25 MED ORDER — DIAZEPAM 5 MG/ML IJ SOLN
5.0000 mg | Freq: Once | INTRAMUSCULAR | Status: AC
Start: 2015-05-25 — End: 2015-05-25
  Administered 2015-05-25: 5 mg via INTRAVENOUS

## 2015-05-25 MED ORDER — DIAZEPAM 5 MG PO TABS: 5.0000 mg | ORAL_TABLET | Freq: Three times a day (TID) | ORAL | Status: DC | PRN

## 2015-05-25 MED ORDER — DIAZEPAM 5 MG/ML IJ SOLN
5.0000 mg | Freq: Once | INTRAMUSCULAR | Status: AC
  Administered 2015-05-25: 5 mg via INTRAVENOUS

## 2015-05-25 MED ORDER — DIAZEPAM 5 MG/ML IJ SOLN
10.0000 mg | Freq: Once | INTRAMUSCULAR | Status: DC
Start: 2015-05-25 — End: 2015-05-25
  Filled 2015-05-25: qty 2

## 2015-05-25 NOTE — ED Notes (Signed)
Reports waking up this am with dizziness and nausea

## 2015-05-25 NOTE — ED Notes (Signed)
Pt continues to vomit with any movement; MD made aware

## 2015-05-25 NOTE — Discharge Instructions (Signed)
Benign Positional Vertigo Vertigo is the feeling that you or your surroundings are moving when they are not. Benign positional vertigo is the most common form of vertigo. The cause of this condition is not serious (is benign). This condition is triggered by certain movements and positions (is positional). This condition can be dangerous if it occurs while you are doing something that could endanger you or others, such as driving.  CAUSES In many cases, the cause of this condition is not known. It may be caused by a disturbance in an area of the inner ear that helps your brain to sense movement and balance. This disturbance can be caused by a viral infection (labyrinthitis), head injury, or repetitive motion. RISK FACTORS This condition is more likely to develop in: 1. Women. 2. People who are 50 years of age or older. SYMPTOMS Symptoms of this condition usually happen when you move your head or your eyes in different directions. Symptoms may start suddenly, and they usually last for less than a minute. Symptoms may include:  Loss of balance and falling.  Feeling like you are spinning or moving.  Feeling like your surroundings are spinning or moving.  Nausea and vomiting.  Blurred vision.  Dizziness.  Involuntary eye movement (nystagmus). Symptoms can be mild and cause only slight annoyance, or they can be severe and interfere with daily life. Episodes of benign positional vertigo may return (recur) over time, and they may be triggered by certain movements. Symptoms may improve over time. DIAGNOSIS This condition is usually diagnosed by medical history and a physical exam of the head, neck, and ears. You may be referred to a health care provider who specializes in ear, nose, and throat (ENT) problems (otolaryngologist) or a provider who specializes in disorders of the nervous system (neurologist). You may have additional testing, including:  MRI.  A CT scan.  Eye movement tests. Your  health care provider may ask you to change positions quickly while he or she watches you for symptoms of benign positional vertigo, such as nystagmus. Eye movement may be tested with an electronystagmogram (ENG), caloric stimulation, the Dix-Hallpike test, or the roll test.  An electroencephalogram (EEG). This records electrical activity in your brain.  Hearing tests. TREATMENT Usually, your health care provider will treat this by moving your head in specific positions to adjust your inner ear back to normal. Surgery may be needed in severe cases, but this is rare. In some cases, benign positional vertigo may resolve on its own in 2-4 weeks. HOME CARE INSTRUCTIONS Safety  Move slowly.Avoid sudden body or head movements.  Avoid driving.  Avoid operating heavy machinery.  Avoid doing any tasks that would be dangerous to you or others if a vertigo episode would occur.  If you have trouble walking or keeping your balance, try using a cane for stability. If you feel dizzy or unstable, sit down right away.  Return to your normal activities as told by your health care provider. Ask your health care provider what activities are safe for you. General Instructions  Take over-the-counter and prescription medicines only as told by your health care provider.  Avoid certain positions or movements as told by your health care provider.  Drink enough fluid to keep your urine clear or pale yellow.  Keep all follow-up visits as told by your health care provider. This is important. SEEK MEDICAL CARE IF:  You have a fever.  Your condition gets worse or you develop new symptoms.  Your family or friends   notice any behavioral changes.  Your nausea or vomiting gets worse.  You have numbness or a "pins and needles" sensation. SEEK IMMEDIATE MEDICAL CARE IF:  You have difficulty speaking or moving.  You are always dizzy.  You faint.  You develop severe headaches.  You have weakness in your  legs or arms.  You have changes in your hearing or vision.  You develop a stiff neck.  You develop sensitivity to light.   This information is not intended to replace advice given to you by your health care provider. Make sure you discuss any questions you have with your health care provider.   Document Released: 01/09/2006 Document Revised: 12/23/2014 Document Reviewed: 07/27/2014 Elsevier Interactive Patient Education 2016 [employer] Epley Maneuver Self-Care WHAT IS THE EPLEY MANEUVER? The Epley maneuver is an exercise you can do to relieve symptoms of benign paroxysmal positional vertigo (BPPV). This condition is often just referred to as vertigo. BPPV is caused by the movement of tiny crystals (canaliths) inside your inner ear. The accumulation and movement of canaliths in your inner ear causes a sudden spinning sensation (vertigo) when you move your head to certain positions. Vertigo usually lasts about 30 seconds. BPPV usually occurs in just one ear. If you get vertigo when you lie on your left side, you probably have BPPV in your left ear. Your health care provider can tell you which ear is involved.  BPPV may be caused by a head injury. Many people older than 50 get BPPV for unknown reasons. If you have been diagnosed with BPPV, your health care provider may teach you how to do this maneuver. BPPV is not life threatening (benign) and usually goes away in time.  WHEN SHOULD I PERFORM THE EPLEY MANEUVER? You can do this maneuver at home whenever you have symptoms of vertigo. You may do the Epley maneuver up to 3 times a day until your symptoms of vertigo go away. HOW SHOULD I DO THE EPLEY MANEUVER? 3. Sit on the edge of a bed or table with your back straight. Your legs should be extended or hanging over the edge of the bed or table.  4. Turn your head halfway toward the affected ear.  5. Lie backward quickly with your head turned until you are lying flat on your back. You may want to  position a pillow under your shoulders.  6. Hold this position for 30 seconds. You may experience an attack of vertigo. This is normal. Hold this position until the vertigo stops. 7. Then turn your head to the opposite direction until your unaffected ear is facing the floor.  8. Hold this position for 30 seconds. You may experience an attack of vertigo. This is normal. Hold this position until the vertigo stops. 9. Now turn your whole body to the same side as your head. Hold for another 30 seconds.  10. You can then sit back up. ARE THERE RISKS TO THIS MANEUVER? In some cases, you may have other symptoms (such as changes in your vision, weakness, or numbness). If you have these symptoms, stop doing the maneuver and call your health care provider. Even if doing these maneuvers relieves your vertigo, you may still have dizziness. Dizziness is the sensation of light-headedness but without the sensation of movement. Even though the Epley maneuver may relieve your vertigo, it is possible that your symptoms will return within 5 years. WHAT SHOULD I DO AFTER THIS MANEUVER? After doing the Epley maneuver, you can return to your normal   activities. Ask your doctor if there is anything you should do at home to prevent vertigo. This may include:  Sleeping with two or more pillows to keep your head elevated.  Not sleeping on the side of your affected ear.  Getting up slowly from bed.  Avoiding sudden movements during the day.  Avoiding extreme head movement, like looking up or bending over.  Wearing a cervical collar to prevent sudden head movements. WHAT SHOULD I DO IF MY SYMPTOMS GET WORSE? Call your health care provider if your vertigo gets worse. Call your provider right way if you have other symptoms, including:   Nausea.  Vomiting.  Headache.  Weakness.  Numbness.  Vision changes.   This information is not intended to replace advice given to you by your health care provider. Make  sure you discuss any questions you have with your health care provider.   Document Released: 04/08/2013 Document Reviewed: 04/08/2013 Elsevier Interactive Patient Education 2016 [employer]  

## 2015-05-25 NOTE — ED Notes (Signed)
Pt complains of dizziness with nausea/vomiting starting this morning, pt denies pain or any other symptoms, face symmetrical, grips equal

## 2015-05-25 NOTE — ED Provider Notes (Signed)
[facility] Emergency Department Provider Note  Time seen: 8:22 AM  I have reviewed the triage vital signs and the nursing notes.   HISTORY  Chief Complaint Dizziness    HPI Dustin [name] is a 60 y.o. male with a past medical history of hypertension, hyperlipidemia presents the emergency department with dizziness nausea and vomiting. According to the patient he awoke this morning with significant dizziness as if the room was spinning around him, became very nauseated and has been vomiting. Patient states the dizziness has been constant, feels like the room is spinning around him very nauseating. States mild headache, denies focal weakness or numbness.Describes his dizziness as severe, nausea is severe.     Past Medical History  Diagnosis Date  . Allergy   . Hyperlipidemia   . Hypertension     Patient Active Problem List   Diagnosis Date Noted  . ETD (eustachian tube dysfunction) 10/01/2014  . Advance care planning 02/08/2014  . Family history of prostate cancer 11/18/2010  . Routine general medical examination at a health care facility 11/18/2010  . Essential hypertension 02/19/2007  . ALLERGIC RHINITIS 02/19/2007    Past Surgical History  Procedure Laterality Date  . Vasectomy  04/1995  . Tympanostomy tube placement      Current Outpatient Rx  Name  Route  Sig  Dispense  Refill  . aspirin 81 MG tablet   Oral   Take 81 mg by mouth daily.           . fluticasone (FLONASE) 50 MCG/ACT nasal spray   Each Nare   Place 1-2 sprays into both nostrils daily.           Rx has expired - unused refills remain   . metoprolol succinate (TOPROL-XL) 50 MG 24 hr tablet   Oral   Take 1 tablet (50 mg total) by mouth daily. Take with or immediately following a meal.   90 tablet   3   . Multiple Vitamin (MULTIVITAMIN) tablet   Oral   Take 1 tablet by mouth daily.             Allergies Erythromycin  Family History  Problem Relation Age  of Onset  . Hyperlipidemia Mother   . Hypertension Mother   . Coronary artery disease Father   . Stroke Father   . Cancer Father     prostate  . Heart disease Father   . Prostate cancer Father   . COPD Sister   . Diabetes Paternal Uncle   . Diabetes Paternal Grandfather   . Colon cancer Neg Hx     Social History Social History  Substance Use Topics  . Smoking status: Former Research scientist (life sciences)  . Smokeless tobacco: Never Used  . Alcohol Use: 0.0 oz/week    0 Standard drinks or equivalent per week     Comment: occ glass of wine/week    Review of Systems Constitutional: Negative for fever. Eyes: Dizzy, difficult to keep his eyes open due to dizziness Cardiovascular: Negative for chest pain. Respiratory: Negative for shortness of breath. Gastrointestinal: Negative for abdominal pain. Positive for nausea and vomiting. Negative for diarrhea. Neurological: Negative for headache 10-point ROS otherwise negative.  ____________________________________________   PHYSICAL EXAM:  VITAL SIGNS: ED Triage Vitals  Enc Vitals Group     BP 05/25/15 0757 175/106 mmHg     Pulse Rate 05/25/15 0757 64     Resp --      Temp 05/25/15 0757 97.4 F (36.3 C)  Temp Source 05/25/15 0757 Oral     SpO2 05/25/15 0757 98 %     Weight 05/25/15 0757 265 lb (120.203 kg)     Height 05/25/15 0757 5\' 9"  (1.753 m)     Head Cir --      Peak Flow --      Pain Score --      Pain Loc --      Pain Edu? --      Excl. in [place]? --     Constitutional: Alert and oriented. Well appearing and in no distress. Eyes: Significant nystagmus on exam. Eyes beating to the right. ENT   Head: Normocephalic and atraumatic. Normal tympanic membranes.   Mouth/Throat: Mucous membranes are moist. Cardiovascular: Normal rate, regular rhythm. No murmur Respiratory: Normal respiratory effort without tachypnea nor retractions. Breath sounds are clear  Gastrointestinal: Soft and nontender. No distention.   Musculoskeletal:  Nontender with normal range of motion in all extremities.  Neurologic:  Normal speech and language. No gross focal neurologic deficits Skin:  Skin is warm, dry and intact.    ____________________________________________    EKG  EKG reviewed and interpreted by myself shows normal sinus rhythm at 52 bpm, narrow QRS, normal axis, largely normal intervals, RSR pattern consistent with right bundle branch block. Nonspecific ST changes no ST elevations.  ____________________________________________    RADIOLOGY  CT head negative  ____________________________________________   INITIAL IMPRESSION / ASSESSMENT AND PLAN / ED COURSE  Pertinent labs & imaging results that were available during my care of the patient were reviewed by me and considered in my medical decision making (see chart for details).  Patient presents the emergency department dizziness nausea and vomiting. Exam most consistent with BPPV however the patient has no history of BPPV in the past. Patient has beating nystagmus on exam. Patient nauseated and vomits several times throughout examination. We will treat with IV Valium, attempt Epley maneuver, and monitor closely in the emergency department.  Labs are within normal limits. CT head is negative. Attempted Epley maneuver, with satisfactory results. Patient states the dizziness has decreased but is still present. Patient at least able to sit up without feeling nauseated, patient has also received IV Valium. We will continue to monitor in the emergency department, again highly suspect BPPV as the underlying cause of the patient's nausea and dizziness.  Patient continues to feel well. We'll discharge on Valium when necessary. Patient sees Dr. Tami Ribas for sinus issues, he will follow-up with Dr. Tami Ribas as soon as possible. I discussed return precautions to which the patient is agreeable.  ____________________________________________   FINAL CLINICAL IMPRESSION(S) / ED  DIAGNOSES  BPPV   Harvest Dark, MD 05/25/15 1037

## 2015-05-28 ENCOUNTER — Ambulatory Visit (INDEPENDENT_AMBULATORY_CARE_PROVIDER_SITE_OTHER): Payer: BC Managed Care – PPO | Admitting: Family Medicine

## 2015-05-28 ENCOUNTER — Encounter: Payer: Self-pay | Admitting: Family Medicine

## 2015-05-28 VITALS — BP 134/84 | HR 65 | Temp 97.9°F | Wt 275.8 lb

## 2015-05-28 DIAGNOSIS — H8110 Benign paroxysmal vertigo, unspecified ear: Secondary | ICD-10-CM | POA: Diagnosis not present

## 2015-05-28 MED ORDER — AMOXICILLIN-POT CLAVULANATE 875-125 MG PO TABS: 1.0000 | ORAL_TABLET | Freq: Two times a day (BID) | ORAL | Status: DC

## 2015-05-28 NOTE — Progress Notes (Signed)
Pre visit review using our clinic review tool, if applicable. No additional management support is needed unless otherwise documented below in the visit note.  ER f/u.  Woke up dizzy.  The room was spinning, always L to R on the front.  Had trouble before getting out of bed.  Had to go to [facility] after vomiting.  Given valium at ER.  He had epley maneuver done at ER.  D/w pt about path/phys.  No sx now.  He took valium after the ER once but not since.  No FCNAVD.  He feels basically back to baseline except for sx suggestive of sinus infection.  Voice is altered.  No fevers.  HA and stuffy.  Occ R ear pain.  Those sx started about 5 days ago.    Meds, vitals, and allergies reviewed.   ROS: See HPI.  Otherwise, noncontributory.  GEN: nad, alert and oriented HEENT: mucous membranes moist, tm w/o erythema, nasal exam w/o erythema, clear discharge noted,  OP with cobblestoning NECK: supple w/o LA CV: rrr.   PULM: ctab, no inc wob EXT: no edema SKIN: no acute rash Sinuses not ttp.  CN 2-12 wnl B, S/S/DTR wnl x4

## 2015-05-28 NOTE — Patient Instructions (Signed)
Use the bedside exercise as needed for BPV.  If you have more facial pain or a fever then start the antibiotics.   Take care.  Glad to see you.

## 2015-05-30 DIAGNOSIS — H8110 Benign paroxysmal vertigo, unspecified ear: Secondary | ICD-10-CM | POA: Insufficient documentation

## 2015-05-30 NOTE — Assessment & Plan Note (Signed)
Path/phys d/w pt.  He is clearly better. D/w pt about bedside exercise and f/u prn. Okay for outpatient f/u The URI sx are likely incidental.  D/w pt, will hold abx for now and start if sx progress.  He agrees.  Nontoxic.

## 2015-06-03 DIAGNOSIS — R42 Dizziness and giddiness: Secondary | ICD-10-CM

## 2015-06-03 HISTORY — DX: Dizziness and giddiness: R42

## 2015-10-22 ENCOUNTER — Other Ambulatory Visit: Payer: Self-pay | Admitting: *Deleted

## 2015-10-22 ENCOUNTER — Other Ambulatory Visit: Payer: Self-pay | Admitting: Family Medicine

## 2015-10-22 MED ORDER — FLUTICASONE PROPIONATE 50 MCG/ACT NA SUSP: 1.0000 | Freq: Every day | NASAL | Status: DC

## 2015-10-22 NOTE — Telephone Encounter (Signed)
Rx done. 

## 2015-10-22 NOTE — Telephone Encounter (Deleted)
PT IS WONDERING IF YOU WILL WRITE RX FOR FLONASE THANKS!  [facility]

## 2015-12-26 ENCOUNTER — Encounter: Payer: Self-pay | Admitting: Family Medicine

## 2016-01-23 ENCOUNTER — Other Ambulatory Visit: Payer: Self-pay | Admitting: Family Medicine

## 2016-01-23 DIAGNOSIS — Z119 Encounter for screening for infectious and parasitic diseases, unspecified: Secondary | ICD-10-CM

## 2016-01-23 DIAGNOSIS — I10 Essential (primary) hypertension: Secondary | ICD-10-CM

## 2016-01-23 DIAGNOSIS — Z125 Encounter for screening for malignant neoplasm of prostate: Secondary | ICD-10-CM

## 2016-01-29 ENCOUNTER — Encounter: Payer: Self-pay | Admitting: Family Medicine

## 2016-01-31 ENCOUNTER — Encounter: Payer: Self-pay | Admitting: Family Medicine

## 2016-01-31 ENCOUNTER — Other Ambulatory Visit (INDEPENDENT_AMBULATORY_CARE_PROVIDER_SITE_OTHER): Payer: BC Managed Care – PPO

## 2016-01-31 DIAGNOSIS — Z119 Encounter for screening for infectious and parasitic diseases, unspecified: Secondary | ICD-10-CM

## 2016-01-31 DIAGNOSIS — Z125 Encounter for screening for malignant neoplasm of prostate: Secondary | ICD-10-CM

## 2016-01-31 DIAGNOSIS — I10 Essential (primary) hypertension: Secondary | ICD-10-CM | POA: Diagnosis not present

## 2016-01-31 LAB — LIPID PANEL
Cholesterol: 153 mg/dL (ref 0–200)
HDL: 44.5 mg/dL
LDL Cholesterol: 92 mg/dL (ref 0–99)
NonHDL: 108.16
Total CHOL/HDL Ratio: 3
Triglycerides: 81 mg/dL (ref 0.0–149.0)
VLDL: 16.2 mg/dL (ref 0.0–40.0)

## 2016-01-31 LAB — COMPREHENSIVE METABOLIC PANEL
ALBUMIN: 4.2 g/dL (ref 3.5–5.2)
ALK PHOS: 79 U/L (ref 39–117)
ALT: 14 U/L (ref 0–53)
AST: 14 U/L (ref 0–37)
BUN: 16 mg/dL (ref 6–23)
CHLORIDE: 104 meq/L (ref 96–112)
CO2: 30 mEq/L (ref 19–32)
CREATININE: 0.93 mg/dL (ref 0.40–1.50)
Calcium: 9.1 mg/dL (ref 8.4–10.5)
GFR: 87.52 mL/min (ref >60.00)
Glucose, Bld: 111 mg/dL — ABNORMAL HIGH (ref 70–99)
Potassium: 4.3 mEq/L (ref 3.5–5.1)
SODIUM: 140 meq/L (ref 135–145)
TOTAL PROTEIN: 6.7 g/dL (ref 6.0–8.3)
Total Bilirubin: 0.4 mg/dL (ref 0.2–1.2)

## 2016-01-31 LAB — PSA: PSA: 0.42 ng/mL (ref 0.10–4.00)

## 2016-02-01 LAB — HIV ANTIBODY (ROUTINE TESTING W REFLEX): HIV 1&2 Ab, 4th Generation: NONREACTIVE

## 2016-02-01 LAB — HEPATITIS C ANTIBODY: HCV Ab: NEGATIVE

## 2016-02-02 ENCOUNTER — Ambulatory Visit (INDEPENDENT_AMBULATORY_CARE_PROVIDER_SITE_OTHER): Payer: BC Managed Care – PPO | Admitting: Family Medicine

## 2016-02-02 ENCOUNTER — Encounter: Payer: Self-pay | Admitting: Family Medicine

## 2016-02-02 VITALS — BP 158/90 | HR 64 | Temp 97.5°F | Ht 68.0 in | Wt 260.8 lb

## 2016-02-02 DIAGNOSIS — Z0000 Encounter for general adult medical examination without abnormal findings: Secondary | ICD-10-CM

## 2016-02-02 DIAGNOSIS — R5383 Other fatigue: Secondary | ICD-10-CM

## 2016-02-02 DIAGNOSIS — Z23 Encounter for immunization: Secondary | ICD-10-CM | POA: Diagnosis not present

## 2016-02-02 DIAGNOSIS — I10 Essential (primary) hypertension: Secondary | ICD-10-CM

## 2016-02-02 MED ORDER — METOPROLOL SUCCINATE ER 50 MG PO TB24: ORAL_TABLET | ORAL | 3 refills | Status: DC

## 2016-02-02 MED ORDER — SCOPOLAMINE 1 MG/3DAYS TD PT72: 1.0000 | MEDICATED_PATCH | TRANSDERMAL | 0 refills | Status: DC

## 2016-02-02 NOTE — Patient Instructions (Addendum)
Check with your insurance to see if they will cover the shingles shot. Call the GI clinic about follow up for a repeat colonoscopy.   Go to the lab on the way out.  We'll contact you with your lab report. Check your BP a few times out of clinic and update me if consistently >140/>90. Take care.  Glad to see you.

## 2016-02-02 NOTE — Progress Notes (Signed)
Pre visit review using our clinic review tool, if applicable. No additional management support is needed unless otherwise documented below in the visit note. 

## 2016-02-02 NOTE — Progress Notes (Signed)
CPE- See plan.  Routine anticipatory guidance given to patient.  See health maintenance.  Tetanus 2013 Flu 2017 PNA not due yet.  Shingles d/w pt.   HIV and HCV neg.   PSA wnl.  Dw pt.  FH noted.   Colonoscopy due in 2017.  D/w pt.   Living will d/w pt.  Wife designated if patient were incapacitated.  Diet and exercise d/w pt.  Encouraged both.  Doing better with diet than exercise. His commute isn't helping his exercise routine.   Hypertension:    Using medication without problems or lightheadedness: yes Chest pain with exertion:no Edema:no Short of breath:no  If he doesn't take his niacin or B12 then he'll be exhausted for that day.  Unclear if this is causative.  D/w pt.  FH B12 def noted- mother.    He is going on a cruise.  D/w pt about scopolamine patch use prn.    PMH and SH reviewed  Meds, vitals, and allergies reviewed.   ROS: Per HPI.  Unless specifically indicated otherwise in HPI, the patient denies:  General: fever. Eyes: acute vision changes ENT: sore throat Cardiovascular: chest pain Respiratory: SOB GI: vomiting GU: dysuria Musculoskeletal: acute back pain Derm: acute rash Neuro: acute motor dysfunction Psych: worsening mood Endocrine: polydipsia Heme: bleeding Allergy: hayfever  GEN: nad, alert and oriented HEENT: mucous membranes moist NECK: supple w/o LA CV: rrr. PULM: ctab, no inc wob ABD: soft, +bs EXT: no edema SKIN: no acute rash

## 2016-02-03 DIAGNOSIS — R5383 Other fatigue: Secondary | ICD-10-CM | POA: Insufficient documentation

## 2016-02-03 LAB — CBC WITH DIFFERENTIAL/PLATELET
BASOS PCT: 0.4 % (ref 0.0–3.0)
Basophils Absolute: 0 10*3/uL (ref 0.0–0.1)
EOS ABS: 0.1 10*3/uL (ref 0.0–0.7)
Eosinophils Relative: 0.9 % (ref 0.0–5.0)
HEMATOCRIT: 44.8 % (ref 39.0–52.0)
Hemoglobin: 15.1 g/dL (ref 13.0–17.0)
LYMPHS PCT: 30.7 % (ref 12.0–46.0)
Lymphs Abs: 2.4 10*3/uL (ref 0.7–4.0)
MCHC: 33.7 g/dL (ref 30.0–36.0)
MCV: 91.4 fl (ref 78.0–100.0)
MONO ABS: 0.7 10*3/uL (ref 0.1–1.0)
Monocytes Relative: 8.5 % (ref 3.0–12.0)
NEUTROS ABS: 4.7 10*3/uL (ref 1.4–7.7)
Neutrophils Relative %: 59.5 % (ref 43.0–77.0)
Platelets: 246 10*3/uL (ref 150.0–400.0)
RBC: 4.9 Mil/uL (ref 4.22–5.81)
RDW: 13.2 % (ref 11.5–15.5)
WBC: 7.9 10*3/uL (ref 4.0–10.5)

## 2016-02-03 LAB — TSH: TSH: 1.48 u[IU]/mL (ref 0.35–4.50)

## 2016-02-03 LAB — VITAMIN B12: VITAMIN B 12: 805 pg/mL (ref 211–911)

## 2016-02-03 NOTE — Assessment & Plan Note (Addendum)
Tetanus 2013 Flu 2017 PNA not due yet.  Shingles d/w pt.   HIV and HCV neg.   PSA wnl.  Dw pt.  FH noted.   Colonoscopy due in 2017.  D/w pt.   Living will d/w pt.  Wife designated if patient were incapacitated.  Diet and exercise d/w pt.  Encouraged both.  Doing better with diet than exercise. His commute isn't helping his exercise routine. Prescription for scopolamine patch done at [facility], routine cautions given to patient.

## 2016-02-03 NOTE — Assessment & Plan Note (Signed)
Recheck his blood pressure out of clinic and update me if persistently elevated. Discussed with patient. Continue diet. Needs more exercise. Labs discussed with patient. At this point, no change in medicines. He agrees.

## 2016-02-03 NOTE — Assessment & Plan Note (Signed)
Check routine labs, B12 TSH and CBC. See notes on labs. He doesn't have any other clear source for the fatigue noted on exam.

## 2016-05-05 ENCOUNTER — Telehealth: Payer: Self-pay | Admitting: Family Medicine

## 2016-05-05 NOTE — Telephone Encounter (Signed)
[place]  Patient Name: Dustin [name]  DOB: 08-01-1955    Initial Comment Caller thinks he may have the flu, and has been coughing, has aches and congestion.    Nurse Assessment  Nurse: Venetia Maxon, RN, Manuela Schwartz Date/Time (Eastern Time): 05/05/2016 5:26:26 PM  Confirm and document reason for call. If symptomatic, describe symptoms. ---Caller thinks he may have the flu, and has been coughing, has aches and congestion. Symptoms began today: temp is 98.6 orally, stuffy nose. uses neti pot minor body aches . dry cough. body aches.  Does the patient have any new or worsening symptoms? ---Yes  Will a triage be completed? ---Yes  Related visit to physician within the last 2 weeks? ---No  Does the PT have any chronic conditions? (i.e. diabetes, asthma, etc.) ---Yes  List chronic conditions. ---HTN  Is this a behavioral health or substance abuse call? ---No     Guidelines    Guideline Title Affirmed Question Affirmed Notes  Influenza - Seasonal [1] Patient is NOT HIGH RISK AND [2] strongly requests antiviral medicine AND [3] flu symptoms present < 48 hours    Final Disposition User   Call PCP within 24 Hours Whitaker, RN, [facility]    Referrals  [facility]  [facility]   Disagree/Comply: Comply

## 2016-05-08 NOTE — Telephone Encounter (Signed)
I spoke with pt; pt said he alternated tylenol and ibuprofen and is feeling OK today; pt will cb if needed. FYI to Dr Damita Dunnings.

## 2016-07-14 IMAGING — CT CT HEAD W/O CM
1 of 2 series · 13 of 30 positions shown, 17 images · non-contrast
Comparison: None.

CLINICAL DATA: Dizziness and nausea since this morning. No known
injury.

EXAM:
CT HEAD WITHOUT CONTRAST
TECHNIQUE: Contiguous axial images were obtained from the base of the skull
through the vertex without intravenous contrast.

[Series 2: head wo · axial · 0.40mm/px · z∈[-122,+13]mm · 13 of 36 slices shown, 17 images]
[im 3/36  brain]
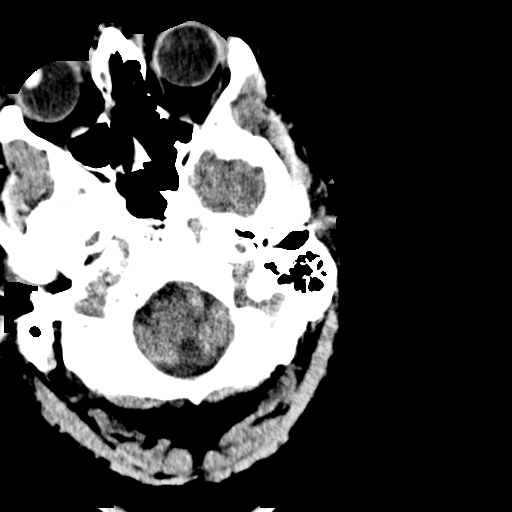
[im 3/36  bone]
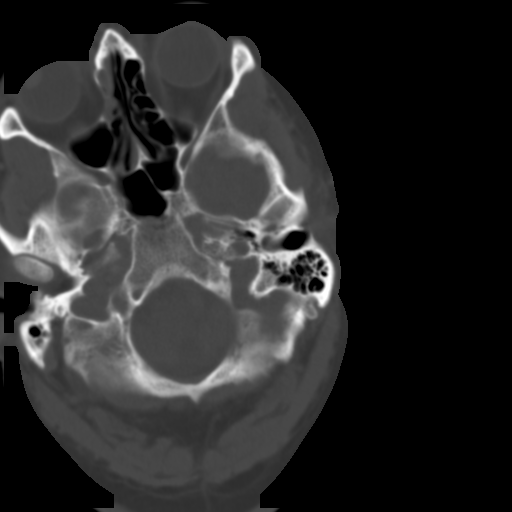
[im 6/36  brain]
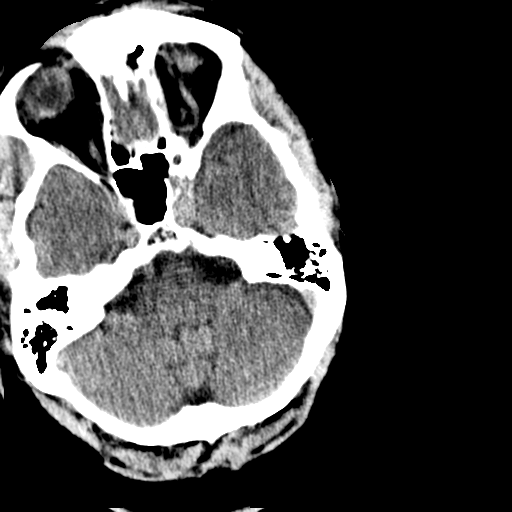
[im 8/36  brain]
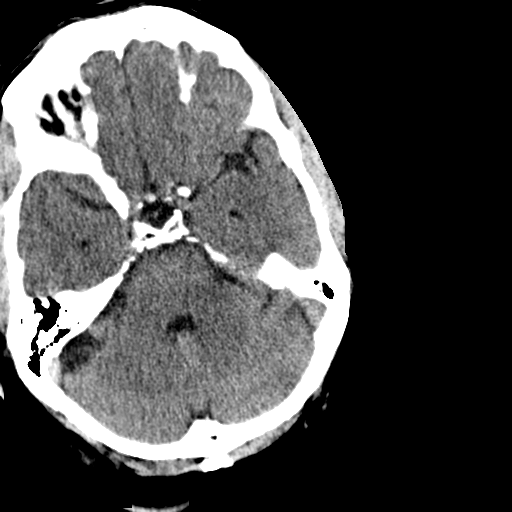
[im 11/36  brain]
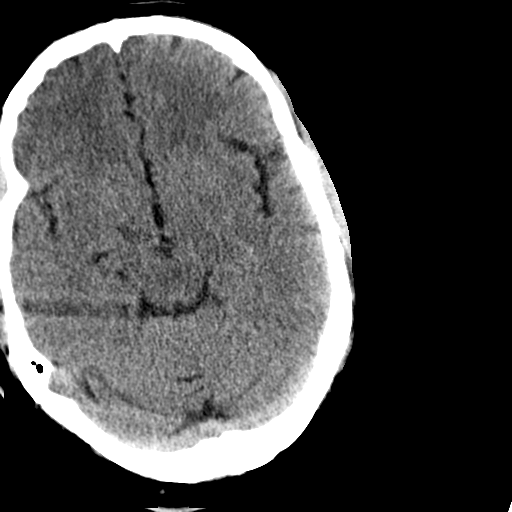
[im 13/36  brain]
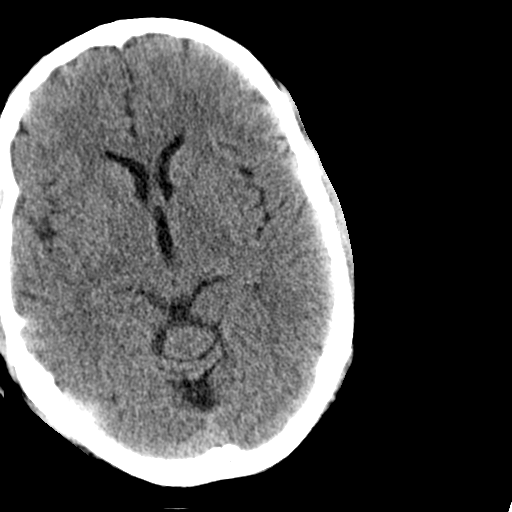
[im 13/36  bone]
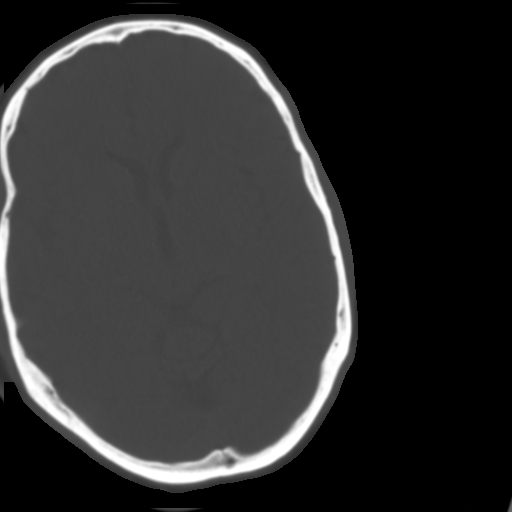
[im 16/36  brain]
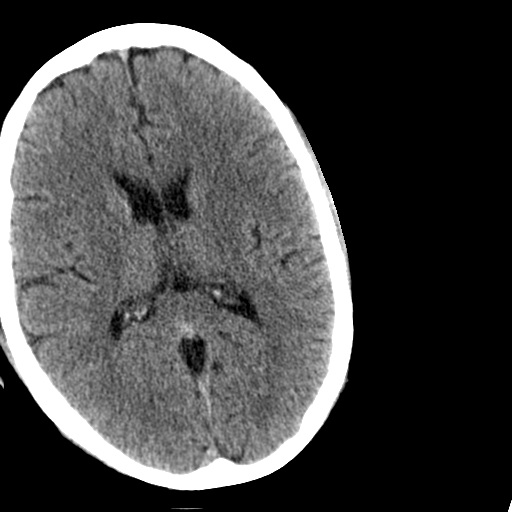
[im 18/36  brain]
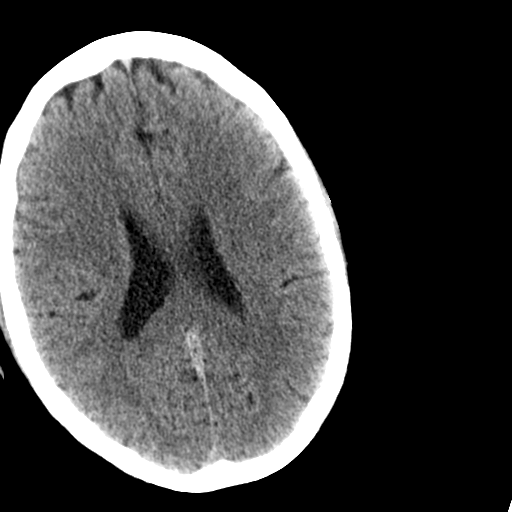
[im 21/36  brain]
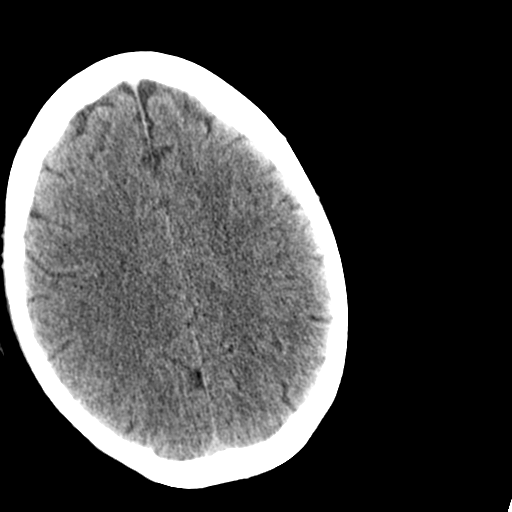
[im 23/36  brain]
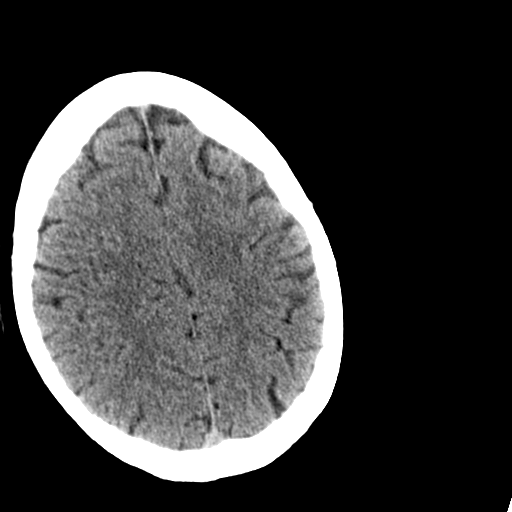
[im 23/36  bone]
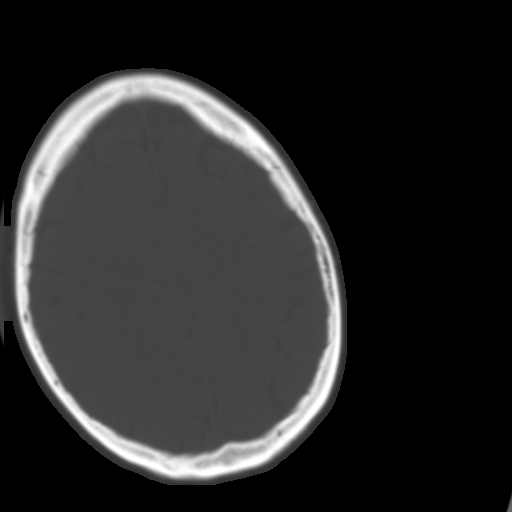
[im 26/36  brain]
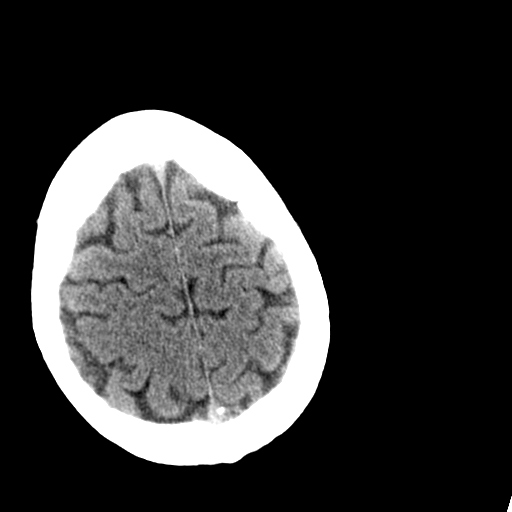
[im 28/36  brain]
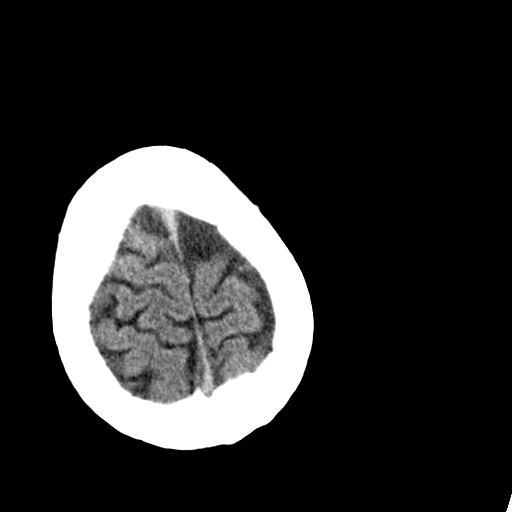
[im 31/36  brain]
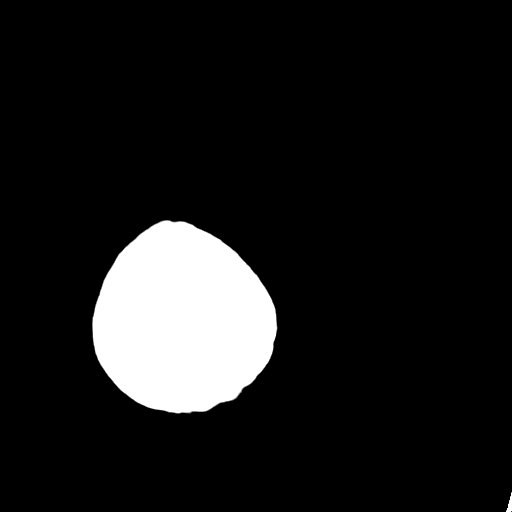
[im 33/36  brain]
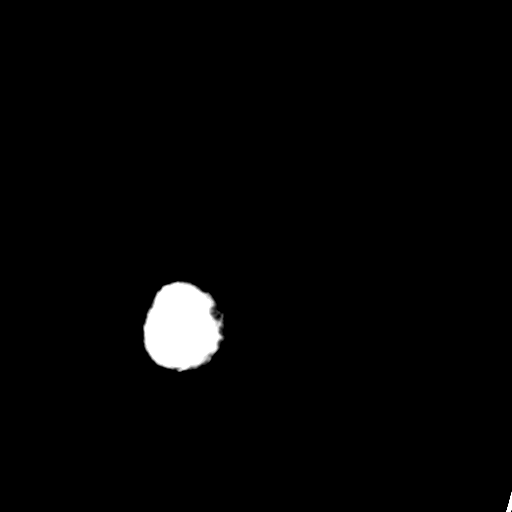
[im 33/36  bone]
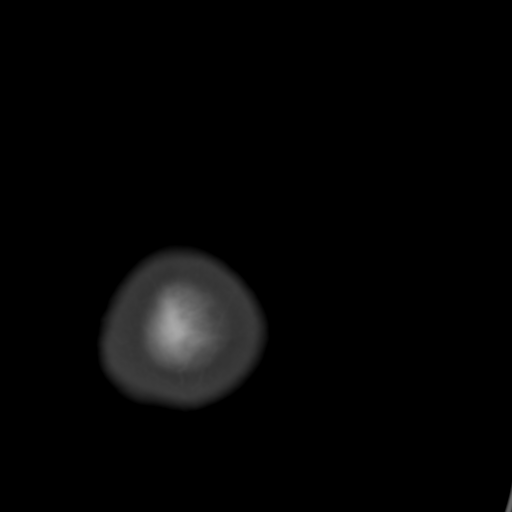

[13 of 30 positions shown; findings below may reference images not displayed]

FINDINGS: The ventricles are normal in size and configuration. There are no
parenchymal masses or mass effect, no evidence of an infarct, no
extra-axial masses or abnormal fluid collections and no intracranial
hemorrhage.

Mucosal thickening lines the anterior right ethmoid air cells.
Remaining visualized sinuses are clear. Frontal sinuses are
developmentally aplastic. Clear mastoid air cells.
IMPRESSION: 1. No acute intracranial abnormality.
2. Right ethmoid air cell mucosal thickening.

## 2016-07-29 ENCOUNTER — Other Ambulatory Visit: Payer: Self-pay | Admitting: Family Medicine

## 2016-11-08 ENCOUNTER — Encounter: Payer: Self-pay | Admitting: Family Medicine

## 2016-12-19 ENCOUNTER — Encounter: Payer: Self-pay | Admitting: Family Medicine

## 2016-12-21 ENCOUNTER — Other Ambulatory Visit: Payer: Self-pay | Admitting: Family Medicine

## 2016-12-21 MED ORDER — METOPROLOL SUCCINATE ER 50 MG PO TB24: ORAL_TABLET | ORAL | 0 refills | Status: DC

## 2017-01-05 ENCOUNTER — Encounter: Payer: Self-pay | Admitting: Family Medicine

## 2017-01-05 ENCOUNTER — Ambulatory Visit (INDEPENDENT_AMBULATORY_CARE_PROVIDER_SITE_OTHER): Payer: BC Managed Care – PPO | Admitting: Family Medicine

## 2017-01-05 VITALS — BP 160/91 | HR 64 | Temp 97.6°F | Wt 269.5 lb

## 2017-01-05 DIAGNOSIS — Z23 Encounter for immunization: Secondary | ICD-10-CM

## 2017-01-05 DIAGNOSIS — I10 Essential (primary) hypertension: Secondary | ICD-10-CM | POA: Diagnosis not present

## 2017-01-05 LAB — BASIC METABOLIC PANEL
BUN: 18 mg/dL (ref 6–23)
CHLORIDE: 104 meq/L (ref 96–112)
CO2: 30 meq/L (ref 19–32)
CREATININE: 0.96 mg/dL (ref 0.40–1.50)
Calcium: 10 mg/dL (ref 8.4–10.5)
GFR: 84.11 mL/min (ref >60.00)
GLUCOSE: 98 mg/dL (ref 70–99)
Potassium: 4.9 mEq/L (ref 3.5–5.1)
Sodium: 141 mEq/L (ref 135–145)

## 2017-01-05 MED ORDER — HYDROCHLOROTHIAZIDE 25 MG PO TABS: 12.5000 mg | ORAL_TABLET | Freq: Every day | ORAL | 3 refills | Status: DC

## 2017-01-05 MED ORDER — METOPROLOL SUCCINATE ER 50 MG PO TB24: ORAL_TABLET | ORAL | Status: DC

## 2017-01-05 NOTE — Patient Instructions (Signed)
Go to the lab on the way out.  We'll contact you with your lab report. Metoprolol 50mg  a day.  Add on HCTZ 12.5mg  a day (1/2 tab) for about 1 week.  If still >140/>90, then increase to 25mg  a day.   Update me as needed.  Take care.  Glad to see you.

## 2017-01-05 NOTE — Progress Notes (Signed)
His BP was a little higher on his cuff today compared to ours.  Has been up to SBP 180 at home.   His job changed.  He looked for a different job.  He is getting ready to change into a new role.  He is going to work in [place].  His is going to be an improvement.    No CP.  Not SOB.  Some BLE edema noted in the last few months, more at the end of the day.  Not an acute change.    He is on 75mg  of metoprolol but had less energy on that.    Diet is fair but exercise has been limited.  He has plans to work on exercise.  D/w pt.    Meds, vitals, and allergies reviewed.   ROS: Per HPI unless specifically indicated in ROS section   GEN: nad, alert and oriented NECK: supple w/o LA CV: rrr.   PULM: ctab, no inc wob ABD: soft, +bs EXT: no edema

## 2017-01-05 NOTE — Assessment & Plan Note (Signed)
New plan: Check labs.  Metoprolol 50mg  a day.  Add on HCTZ 12.5mg  a day (1/2 tab) for about 1 week.  If still >140/>90, then increase to 25mg  a day.   Update me as needed.  Work on diet and exercise.   Flu shot today.   All d/w pt.  He agrees.

## 2017-01-05 NOTE — Addendum Note (Signed)
Addended by: Josetta Huddle on: 01/05/2017 08:45 AM   Modules accepted: Orders

## 2017-01-12 ENCOUNTER — Encounter: Payer: Self-pay | Admitting: Family Medicine

## 2017-02-03 ENCOUNTER — Other Ambulatory Visit: Payer: Self-pay | Admitting: Family Medicine

## 2017-02-07 ENCOUNTER — Encounter: Payer: Self-pay | Admitting: Family Medicine

## 2017-02-07 ENCOUNTER — Ambulatory Visit (INDEPENDENT_AMBULATORY_CARE_PROVIDER_SITE_OTHER): Payer: BC Managed Care – PPO | Admitting: Family Medicine

## 2017-02-07 VITALS — BP 118/80 | HR 60 | Temp 98.6°F | Ht 68.0 in | Wt 269.0 lb

## 2017-02-07 DIAGNOSIS — Z0000 Encounter for general adult medical examination without abnormal findings: Secondary | ICD-10-CM | POA: Diagnosis not present

## 2017-02-07 DIAGNOSIS — I10 Essential (primary) hypertension: Secondary | ICD-10-CM | POA: Diagnosis not present

## 2017-02-07 DIAGNOSIS — Z125 Encounter for screening for malignant neoplasm of prostate: Secondary | ICD-10-CM

## 2017-02-07 DIAGNOSIS — Z7189 Other specified counseling: Secondary | ICD-10-CM

## 2017-02-07 MED ORDER — FLUTICASONE PROPIONATE 50 MCG/ACT NA SUSP: NASAL | 12 refills | Status: DC

## 2017-02-07 MED ORDER — METOPROLOL SUCCINATE ER 25 MG PO TB24: 25.0000 mg | ORAL_TABLET | Freq: Every day | ORAL | 3 refills | Status: DC

## 2017-02-07 MED ORDER — HYDROCHLOROTHIAZIDE 12.5 MG PO TABS: 12.5000 mg | ORAL_TABLET | Freq: Every day | ORAL | 3 refills | Status: DC

## 2017-02-07 NOTE — Progress Notes (Signed)
CPE- See plan.  Routine anticipatory guidance given to patient.  See health maintenance.  The possibility exists that previously documented standard health maintenance information may have been brought forward from a previous encounter into this note.  If needed, that same information has been updated to reflect the current situation based on today's encounter.    Tetanus 2013 Flu 2018 PNA not due yet.  Shingles d/w pt.  HIV and HCV neg.   PSA pending. Dw pt. FH noted.  Colonoscopy due. D/w pt.   Living will d/w pt. Wife designated if patient were incapacitated.  Diet and exercise d/w pt. Encouraged both, discussed.    Hypertension:    Using medication without problems or lightheadedness:  He was lethargic with higher dose of BB and he cut back- see below. He feels better as is.   Chest pain with exertion: no Edema:no Short of breath:no He is taking 12.5mg  of HCTZ and 25mg  metoprolol.   Labs pending.    He is in his new job and that is going well.  He is happy with the change.    PMH and SH reviewed  Meds, vitals, and allergies reviewed.   ROS: Per HPI.  Unless specifically indicated otherwise in HPI, the patient denies:  General: fever. Eyes: acute vision changes ENT: sore throat Cardiovascular: chest pain Respiratory: SOB GI: vomiting GU: dysuria Musculoskeletal: acute back pain Derm: acute rash Neuro: acute motor dysfunction Psych: worsening mood Endocrine: polydipsia Heme: bleeding Allergy: hayfever  GEN: nad, alert and oriented HEENT: mucous membranes moist NECK: supple w/o LA CV: rrr. PULM: ctab, no inc wob ABD: soft, +bs EXT: no edema SKIN: no acute rash except form minimal dry skin on the B lower legs  See AVS.

## 2017-02-07 NOTE — Patient Instructions (Addendum)
Check with your insurance to see if they will cover the shingrix shot. Call about a colonoscopy.  Let us know if you need help getting it set up.  Go to the lab on the way out.  We'll contact you with your lab report. Take care.  Glad to see you.

## 2017-02-08 LAB — HEPATIC FUNCTION PANEL
ALBUMIN: 4.3 g/dL (ref 3.5–5.2)
ALT: 20 U/L (ref 0–53)
AST: 18 U/L (ref 0–37)
Alkaline Phosphatase: 78 U/L (ref 39–117)
BILIRUBIN DIRECT: 0.1 mg/dL (ref 0.0–0.3)
TOTAL PROTEIN: 7 g/dL (ref 6.0–8.3)
Total Bilirubin: 0.4 mg/dL (ref 0.2–1.2)

## 2017-02-08 LAB — LIPID PANEL
CHOL/HDL RATIO: 3
Cholesterol: 170 mg/dL (ref 0–200)
HDL: 49 mg/dL (ref >39.00)
LDL CALC: 103 mg/dL — AB (ref 0–99)
NonHDL: 121.24
TRIGLYCERIDES: 93 mg/dL (ref 0.0–149.0)
VLDL: 18.6 mg/dL (ref 0.0–40.0)

## 2017-02-08 LAB — PSA: PSA: 0.39 ng/mL (ref 0.10–4.00)

## 2017-02-08 NOTE — Assessment & Plan Note (Signed)
Tetanus 2013 Flu 2018 PNA not due yet.  Shingles d/w pt.  HIV and HCV neg.   PSA pending. Dw pt. FH noted.  Colonoscopy due. D/w pt.   Living will d/w pt. Wife designated if patient were incapacitated.  Diet and exercise d/w pt. Encouraged both, discussed.

## 2017-02-08 NOTE — Assessment & Plan Note (Signed)
Improved with hydrochlorothiazide and metoprolol. Continue as is. Prescriptions updated. See notes on labs. Continue work on diet and exercise.

## 2017-02-08 NOTE — Assessment & Plan Note (Signed)
Living will d/w pt.  Wife designated if patient were incapacitated.   ?

## 2017-03-26 ENCOUNTER — Telehealth: Payer: BC Managed Care – PPO | Admitting: Family

## 2017-03-26 DIAGNOSIS — B9689 Other specified bacterial agents as the cause of diseases classified elsewhere: Secondary | ICD-10-CM

## 2017-03-26 DIAGNOSIS — J028 Acute pharyngitis due to other specified organisms: Secondary | ICD-10-CM

## 2017-03-26 MED ORDER — DOXYCYCLINE HYCLATE 100 MG PO TABS: 100.0000 mg | ORAL_TABLET | Freq: Two times a day (BID) | ORAL | 0 refills | Status: DC

## 2017-03-26 MED ORDER — BENZONATATE 100 MG PO CAPS: 100.0000 mg | ORAL_CAPSULE | Freq: Three times a day (TID) | ORAL | 0 refills | Status: DC | PRN

## 2017-03-26 MED ORDER — PREDNISONE 5 MG PO TABS: 5.0000 mg | ORAL_TABLET | ORAL | 0 refills | Status: DC

## 2017-03-26 NOTE — Progress Notes (Signed)
Thank you for the details you included in the comment boxes. Those details are very helpful in determining the best course of treatment for you and help [place] to provide the best care. The template will say cough, but please know I'm treating cough AND sinus infection. This is a better template we use when the lungs are involved.  We are sorry that you are not feeling well.  Here is how we plan to help!  Based on your presentation I believe you most likely have A cough due to bacteria.  When patients have a fever and a productive cough with a change in color or increased sputum production, we are concerned about bacterial bronchitis.  If left untreated it can progress to pneumonia.  If your symptoms do not improve with your treatment plan it is important that you contact your provider.   I have prescribed Doxycycline 100 mg twice a day for 7 days     In addition you may use A non-prescription cough medication called Mucinex DM: take 2 tablets every 12 hours. and A prescription cough medication called Tessalon Perles 100mg . You may take 1-2 capsules every 8 hours as needed for your cough.  Sterapred 5 mg dosepak  From your responses in the eVisit questionnaire you describe inflammation in the upper respiratory tract which is causing a significant cough.  This is commonly called Bronchitis and has four common causes:    Allergies  Viral Infections  Acid Reflux  Bacterial Infection Allergies, viruses and acid reflux are treated by controlling symptoms or eliminating the cause. An example might be a cough caused by taking certain blood pressure medications. You stop the cough by changing the medication. Another example might be a cough caused by acid reflux. Controlling the reflux helps control the cough.  USE OF BRONCHODILATOR ("RESCUE") INHALERS: There is a risk from using your bronchodilator too frequently.  The risk is that over-reliance on a medication which only relaxes the muscles surrounding  the breathing tubes can reduce the effectiveness of medications prescribed to reduce swelling and congestion of the tubes themselves.  Although you feel brief relief from the bronchodilator inhaler, your asthma may actually be worsening with the tubes becoming more swollen and filled with mucus.  This can delay other crucial treatments, such as oral steroid medications. If you need to use a bronchodilator inhaler daily, several times per day, you should discuss this with your provider.  There are probably better treatments that could be used to keep your asthma under control.     HOME CARE . Only take medications as instructed by your medical team. . Complete the entire course of an antibiotic. . Drink plenty of fluids and get plenty of rest. . Avoid close contacts especially the very young and the elderly . Cover your mouth if you cough or cough into your sleeve. . Always remember to wash your hands . A steam or ultrasonic humidifier can help congestion.   GET HELP RIGHT AWAY IF: . You develop worsening fever. . You become short of breath . You cough up blood. . Your symptoms persist after you have completed your treatment plan MAKE SURE YOU   Understand these instructions.  Will watch your condition.  Will get help right away if you are not doing well or get worse.  Your e-visit answers were reviewed by a board certified advanced clinical practitioner to complete your personal care plan.  Depending on the condition, your plan could have included both over the counter  or prescription medications. If there is a problem please reply  once you have received a response from your provider. Your safety is important to [place].  If you have drug allergies check your prescription carefully.    You can use MyChart to ask questions about today's visit, request a non-urgent call back, or ask for a work or school excuse for 24 hours related to this e-Visit. If it has been greater than 24 hours you will  need to follow up with your provider, or enter a new e-Visit to address those concerns. You will get an e-mail in the next two days asking about your experience.  I hope that your e-visit has been valuable and will speed your recovery. Thank you for using e-visits.

## 2017-04-27 ENCOUNTER — Encounter: Payer: Self-pay | Admitting: Family Medicine

## 2017-04-27 ENCOUNTER — Ambulatory Visit: Payer: BC Managed Care – PPO | Admitting: Family Medicine

## 2017-04-27 VITALS — BP 140/82 | HR 59 | Temp 98.3°F | Wt 277.2 lb

## 2017-04-27 DIAGNOSIS — J22 Unspecified acute lower respiratory infection: Secondary | ICD-10-CM

## 2017-04-27 MED ORDER — AMOXICILLIN 875 MG PO TABS: 875.0000 mg | ORAL_TABLET | Freq: Two times a day (BID) | ORAL | 0 refills | Status: DC

## 2017-04-27 MED ORDER — HYDROCODONE-HOMATROPINE 5-1.5 MG/5ML PO SYRP: 5.0000 mL | ORAL_SOLUTION | Freq: Three times a day (TID) | ORAL | 0 refills | Status: DC | PRN

## 2017-04-27 NOTE — Patient Instructions (Signed)
For nasal congestion you can use saline nasal spray (generic is fine for all). For daytime cough you can try Delsym. Drink enough fluids to make your urine light yellow. For fever/chill/muscle aches you can take over the counter acetaminophen or ibuprofen.  Please come back in if you are not better in 5-7 days or if you develop wheezing, shortness of breath or persistent vomiting.

## 2017-04-27 NOTE — Progress Notes (Signed)
Subjective:    Patient ID: Dustin [name], male    DOB: 1955/04/26, 62 y.o.   MRN: [id number]  HPI This is a 62 yo male who presents today with nasal congestion, cough, headache x 7 days. Cough is hacking, dry. Stuffy nose, fatigued, no ear pain, temporal headache.  No SOB, no wheeze. No fever. Symptoms getting worse over the week. No generalized aches. Has taken some ibuprofen/acetaminophen with relief of headache.   Past Medical History:  Diagnosis Date  . Allergy   . Hyperlipidemia   . Hypertension    Past Surgical History:  Procedure Laterality Date  . TYMPANOSTOMY TUBE PLACEMENT    . VASECTOMY  04/1995   Family History  Problem Relation Age of Onset  . Hyperlipidemia Mother   . Hypertension Mother   . Coronary artery disease Father   . Stroke Father   . Cancer Father        prostate  . Heart disease Father   . Prostate cancer Father   . COPD Sister   . Diabetes Paternal Uncle   . Diabetes Paternal Grandfather   . Colon cancer Neg Hx    Social History   Tobacco Use  . Smoking status: Former Research scientist (life sciences)  . Smokeless tobacco: Never Used  Substance Use Topics  . Alcohol use: Yes    Alcohol/week: 0.0 oz    Comment: occ glass of wine/week  . Drug use: No      Review of Systems Per HPI    Objective:   Physical Exam  Constitutional: He is oriented to person, place, and time. He appears well-developed.  HENT:  Head: Normocephalic and atraumatic.  Right Ear: Tympanic membrane, external ear and ear canal normal.  Left Ear: Tympanic membrane, external ear and ear canal normal.  Nose: Mucosal edema and rhinorrhea present.  Mouth/Throat: Uvula is midline, oropharynx is clear and moist and mucous membranes are normal.  Eyes: Conjunctivae are normal.  Neck: Normal range of motion. Neck supple.  Pulmonary/Chest: Effort normal and breath sounds normal. No respiratory distress. He has no wheezes. He has no rales.  Frequent, dry cough.   Musculoskeletal: He exhibits no  edema.  Lymphadenopathy:    He has no cervical adenopathy.  Neurological: He is alert and oriented to person, place, and time.  Skin: Skin is warm and dry.  Psychiatric: He has a normal mood and affect. His behavior is normal. Judgment and thought content normal.  Vitals reviewed.     BP 140/82   Pulse (!) 59   Temp 98.3 F (36.8 C) (Oral)   Wt 277 lb 4 oz (125.8 kg)   SpO2 94%   BMI 42.16 kg/m  Wt Readings from Last 3 Encounters:  04/27/17 277 lb 4 oz (125.8 kg)  02/07/17 269 lb (122 kg)  01/05/17 269 lb 8 oz (122.2 kg)       Assessment & Plan:  1. Lower respiratory infection - Provided written and verbal information regarding diagnosis and treatment. - RTC precautions reviewed -  Patient Instructions  For nasal congestion you can use saline nasal spray (generic is fine for all). For daytime cough you can try Delsym. Drink enough fluids to make your urine light yellow. For fever/chill/muscle aches you can take over the counter acetaminophen or ibuprofen.  Please come back in if you are not better in 5-7 days or if you develop wheezing, shortness of breath or persistent vomiting.    - HYDROcodone-homatropine (HYCODAN) 5-1.5 MG/5ML syrup; Take 5 mLs by  mouth every 8 (eight) hours as needed for cough.  Dispense: 120 mL; Refill: 0 - amoxicillin (AMOXIL) 875 MG tablet; Take 1 tablet (875 mg total) by mouth 2 (two) times daily.  Dispense: 14 tablet; Refill: 0   Clarene Reamer, FNP-BC  [facility]  04/27/2017 9:07 PM

## 2017-04-28 ENCOUNTER — Telehealth: Payer: Self-pay | Admitting: Family Medicine

## 2017-04-28 MED ORDER — BENZONATATE 200 MG PO CAPS: 200.0000 mg | ORAL_CAPSULE | Freq: Two times a day (BID) | ORAL | 0 refills | Status: DC | PRN

## 2017-04-28 NOTE — Telephone Encounter (Signed)
Received call from RN line. Patient seen yesterday for URI and started on hycodan. Stated that he had a lot of bad dreams on this medication and would like tessalon instead. This was sent into patient's pharmacy.  Algis Greenhouse. Jerline Pain, MD 04/28/2017 11:44 AM

## 2017-04-29 NOTE — Telephone Encounter (Signed)
Allergy list updated.

## 2017-04-30 NOTE — Telephone Encounter (Signed)
PLEASE NOTE: All timestamps contained within this report are represented as [place] Standard Time. CONFIDENTIALTY NOTICE: This fax transmission is intended only for the addressee. It contains information that is legally privileged, confidential or otherwise protected from use or disclosure. If you are not the intended recipient, you are strictly prohibited from reviewing, disclosing, copying using or disseminating any of this information or taking any action in reliance on or regarding this information. If you have received this fax in error, please notify us immediately by telephone so that we can arrange for its return to [place]. Phone: [phone], Toll-Free: [phone], Fax: [phone] Page: 1 of 2 Call Id: [id number] [place] Patient Name: Dustin [name] Gender: Male DOB: [birth year] Age: 62 Y 2 M 12 D Return Phone Number: [phone] (Primary) Address: City/State/Zip: [place] [address] Client [facility] Night - Client Client Site [place] - Night Contact Type Call Who Is Calling Patient / Member / Family / Caregiver Call Type Triage / Clinical Relationship To Patient Self Return Phone Number [phone] (Primary) Chief Complaint Medication reaction Reason for Call Medication Question / Request Initial Comment Pt seen yesterday. Got cough med and antibiotic. Pt having side affects from meds, needs substitute Translation No Nurse Assessment Nurse: Laurena Bering, RN, Helene Kelp Date/Time Eilene Ghazi Time): 04/28/2017 11:34:01 AM Confirm and document reason for call. If symptomatic, describe symptoms. ---Caller states that he was seen 04/27/17. Was given Hycodan cough syrup and having side effects like nightmares, irritability and itching. Requesting Tessalon Perles Does the patient have any new or worsening symptoms? ---Yes Will a triage be completed?  ---Yes Related visit to physician within the last 2 weeks? ---Yes Does the PT have any chronic conditions? (i.e. diabetes, asthma, etc.) ---Yes List chronic conditions. ---hypertension Is this a behavioral health or substance abuse call? ---No Guidelines Guideline Title Affirmed Question Affirmed Notes Nurse Date/Time Eilene Ghazi Time) Medication Question Call Caller has URGENT medication question about med that PCP prescribed and triager unable to answer question Milton Ferguson 04/28/2017 11:38:29 AM Disp. Time Eilene Ghazi Time) Disposition Final User 04/28/2017 11:43:53 AM Called On-Call Provider Laurena Bering, RN, Helene Kelp 04/28/2017 11:41:17 AM Call PCP Now Yes Laurena Bering, RN, [place] Artist Disagree/Comply Comply PLEASE NOTE: All timestamps contained within this report are represented as [place] Standard Time. CONFIDENTIALTY NOTICE: This fax transmission is intended only for the addressee. It contains information that is legally privileged, confidential or otherwise protected from use or disclosure. If you are not the intended recipient, you are strictly prohibited from reviewing, disclosing, copying using or disseminating any of this information or taking any action in reliance on or regarding this information. If you have received this fax in error, please notify us immediately by telephone so that we can arrange for its return to [place]. Phone: [phone], Toll-Free: [phone], Fax: [phone] Page: 2 of 2 Call Id: [id number] [place] Understands Yes PreDisposition Call Doctor Care Advice Given Per Guideline CALL PCP NOW: You need to discuss this with your doctor. I'll page him now. If you haven't heard from the on-call doctor within 30 minutes, call again. CARE ADVICE given per Medication Question Call (Adult) guideline. Comments User: Dorothyann Peng, RN Date/Time Eilene Ghazi Time): 04/28/2017 11:45:08 AM Informed caller that MD is calling medication Referrals REFERRED TO PCP  OFFICE Paging DoctorName Phone DateTime Result/Outcome Message Type Notes Dimas Chyle- MD [phone] 04/28/2017 11:43:53 AM Called On Call Provider - Reached Doctor Paged Dimas Chyle- MD 04/28/2017  11:44:19 AM Spoke with On Call - General Message Result Gave report to MD MD will send order for [facility]

## 2017-05-01 ENCOUNTER — Ambulatory Visit: Payer: Self-pay

## 2017-05-01 MED ORDER — PREDNISONE 20 MG PO TABS: 20.0000 mg | ORAL_TABLET | Freq: Every day | ORAL | 0 refills | Status: DC

## 2017-05-01 NOTE — Telephone Encounter (Signed)
Pt. Called to report he "feels a little better, but this cough is killing me." Reports hydrocodone cough syrup made him "itchy", Delsym makes him feel sick. He has Tessalon perles he can take. Asking for something for the cough and does he need to be on a steroid.

## 2017-05-01 NOTE — Telephone Encounter (Signed)
Tessalon is reasonable to use.  If any wheezing, then reasonable to start a steroid.  rx sent.  Take with food.  If not better or if worse then needs recheck.  Thanks.

## 2017-05-01 NOTE — Telephone Encounter (Signed)
Left detailed message on voicemail.  

## 2017-05-08 ENCOUNTER — Other Ambulatory Visit: Payer: Self-pay | Admitting: Family Medicine

## 2017-05-08 NOTE — Telephone Encounter (Signed)
Electronic refill request. Benzonatate Last office visit:   04/27/17 Last Filled:    20 capsule 0 04/28/2017  Please advise.

## 2017-05-09 NOTE — Telephone Encounter (Signed)
Sent. Thanks.   

## 2017-05-23 ENCOUNTER — Ambulatory Visit: Payer: Self-pay

## 2017-05-23 NOTE — Telephone Encounter (Signed)
Pt called x2. Call not returned.

## 2017-05-23 NOTE — Telephone Encounter (Signed)
Left message for pt. To call back to discuss symptoms. Instructed to use good handwashing and gently wipe eyes with warm, moist washcloth. Clean hard surfaces as needed.

## 2017-05-24 ENCOUNTER — Encounter: Payer: Self-pay | Admitting: Family Medicine

## 2017-05-24 ENCOUNTER — Ambulatory Visit: Payer: BC Managed Care – PPO | Admitting: Family Medicine

## 2017-05-24 DIAGNOSIS — J069 Acute upper respiratory infection, unspecified: Secondary | ICD-10-CM | POA: Diagnosis not present

## 2017-05-24 MED ORDER — BENZONATATE 200 MG PO CAPS: 200.0000 mg | ORAL_CAPSULE | Freq: Three times a day (TID) | ORAL | 1 refills | Status: DC | PRN

## 2017-05-24 MED ORDER — POLYMYXIN B-TRIMETHOPRIM 10000-0.1 UNIT/ML-% OP SOLN: 1.0000 [drp] | Freq: Four times a day (QID) | OPHTHALMIC | 0 refills | Status: DC

## 2017-05-24 NOTE — Progress Notes (Signed)
Sx started a few days ago.  Cough and mild congestion, cough is better except for a few few fits.  No fevers.  Some minimal eye discharge.  Some nasal discharge.  Still using neti pot.  No vomiting.  Some diarrhea yesterday, self limited.  He was able to work from home today.  He feels better today, esp compared to yesterday.    Wife with conjunctivitis recently.    BP is reasonable.    Meds, vitals, and allergies reviewed.   ROS: Per HPI unless specifically indicated in ROS section   GEN: nad, alert and oriented HEENT: mucous membranes moist, tm w/o erythema, nasal exam w/o erythema, clear discharge noted,  OP with cobblestoning NECK: supple w/o LA CV: rrr.   PULM: ctab, no inc wob EXT: no edema

## 2017-05-24 NOTE — Patient Instructions (Addendum)
Use the neti pot.  If you have significant eye discharge then start the drops.  Update me as needed.  Use tessalon for the cough as needed.  Take care.  Glad to see you.

## 2017-05-27 DIAGNOSIS — J069 Acute upper respiratory infection, unspecified: Secondary | ICD-10-CM | POA: Insufficient documentation

## 2017-05-27 NOTE — Assessment & Plan Note (Addendum)
He feels some better already today. Use a neti pot as needed.   If significant eye discharge then start the Polytrim drops. Update me as needed.  Use tessalon for the cough as needed.  He agrees. Okay to hold off on antibiotics at this point.  Rationale discussed with patient.

## 2017-06-16 ENCOUNTER — Telehealth: Payer: BC Managed Care – PPO | Admitting: Nurse Practitioner

## 2017-06-16 DIAGNOSIS — R05 Cough: Secondary | ICD-10-CM

## 2017-06-16 DIAGNOSIS — R059 Cough, unspecified: Secondary | ICD-10-CM

## 2017-06-16 MED ORDER — LEVOFLOXACIN 500 MG PO TABS: 500.0000 mg | ORAL_TABLET | Freq: Every day | ORAL | 0 refills | Status: DC

## 2017-06-16 MED ORDER — PREDNISONE 10 MG (21) PO TBPK: ORAL_TABLET | ORAL | 0 refills | Status: DC

## 2017-06-16 NOTE — Progress Notes (Signed)
We are sorry that you are not feeling well.  Here is how we plan to help!  Based on your presentation I believe you most likely have A cough due to bacteria.  When patients have a fever and a productive cough with a change in color or increased sputum production, we are concerned about bacterial bronchitis.  If left untreated it can progress to pneumonia.  If your symptoms do not improve with your treatment plan it is important that you contact your provider.   I have prescribed Levofloxacin 500 mg daily for 7 days    In addition you may use A non-prescription cough medication called Mucinex DM: take 2 tablets every 12 hours.  Sterapred 10 mg dosepak  From your responses in the eVisit questionnaire you describe inflammation in the upper respiratory tract which is causing a significant cough.  This is commonly called Bronchitis and has four common causes:    Allergies  Viral Infections  Acid Reflux  Bacterial Infection Allergies, viruses and acid reflux are treated by controlling symptoms or eliminating the cause. An example might be a cough caused by taking certain blood pressure medications. You stop the cough by changing the medication. Another example might be a cough caused by acid reflux. Controlling the reflux helps control the cough.  USE OF BRONCHODILATOR ("RESCUE") INHALERS: There is a risk from using your bronchodilator too frequently.  The risk is that over-reliance on a medication which only relaxes the muscles surrounding the breathing tubes can reduce the effectiveness of medications prescribed to reduce swelling and congestion of the tubes themselves.  Although you feel brief relief from the bronchodilator inhaler, your asthma may actually be worsening with the tubes becoming more swollen and filled with mucus.  This can delay other crucial treatments, such as oral steroid medications. If you need to use a bronchodilator inhaler daily, several times per day, you should discuss this  with your provider.  There are probably better treatments that could be used to keep your asthma under control.     HOME CARE . Only take medications as instructed by your medical team. . Complete the entire course of an antibiotic. . Drink plenty of fluids and get plenty of rest. . Avoid close contacts especially the very young and the elderly . Cover your mouth if you cough or cough into your sleeve. . Always remember to wash your hands . A steam or ultrasonic humidifier can help congestion.   GET HELP RIGHT AWAY IF: . You develop worsening fever. . You become short of breath . You cough up blood. . Your symptoms persist after you have completed your treatment plan MAKE SURE YOU   Understand these instructions.  Will watch your condition.  Will get help right away if you are not doing well or get worse.  Your e-visit answers were reviewed by a board certified advanced clinical practitioner to complete your personal care plan.  Depending on the condition, your plan could have included both over the counter or prescription medications. If there is a problem please reply  once you have received a response from your provider. Your safety is important to [place].  If you have drug allergies check your prescription carefully.    You can use MyChart to ask questions about today's visit, request a non-urgent call back, or ask for a work or school excuse for 24 hours related to this e-Visit. If it has been greater than 24 hours you will need to follow up with your  provider, or enter a new e-Visit to address those concerns. You will get an e-mail in the next two days asking about your experience.  I hope that your e-visit has been valuable and will speed your recovery. Thank you for using e-visits.

## 2017-06-19 ENCOUNTER — Encounter: Payer: Self-pay | Admitting: Family Medicine

## 2017-07-30 ENCOUNTER — Encounter: Payer: Self-pay | Admitting: Pulmonary Disease

## 2017-07-30 ENCOUNTER — Ambulatory Visit: Payer: BC Managed Care – PPO | Admitting: Pulmonary Disease

## 2017-07-30 VITALS — BP 140/80 | HR 73 | Ht 69.0 in | Wt 271.0 lb

## 2017-07-30 DIAGNOSIS — R059 Cough, unspecified: Secondary | ICD-10-CM

## 2017-07-30 DIAGNOSIS — R05 Cough: Secondary | ICD-10-CM | POA: Diagnosis not present

## 2017-07-30 LAB — NITRIC OXIDE: NITRIC OXIDE: 21

## 2017-07-30 NOTE — Patient Instructions (Signed)
Glad that your breathing is improving I recommend that you get a sleep study done for evaluation of sleep apnea This can be done at [facility] Please call us when you are ready to get this done and will place order and a follow-up appointment.

## 2017-07-30 NOTE — Progress Notes (Signed)
Dustin [name]    [id number]    1955/05/16  Primary Care Physician:Duncan, Elveria Rising, MD  Referring Physician: Tonia Ghent, MD [address] [place], [place] [address]  Chief complaint: Consult for cough   HPI: 62 year old with past medical history of hypertension, allergies developed a respiratory tract infection in December 2018 and developed a lingering nonproductive cough that lasted until January.  Denies any dyspnea, mucus production, wheezing, fevers, chills His symptoms are spontaneously improved as the weather got warmer.  Currently he has very mild symptoms of occasional cough. Reports excessive snoring.  Pets: Has a dog.  Outside cats.  No birds, farm animals Occupation: Works as a Education administrator Exposures: No known exposures, no dampness, mold, hot tubs Smoking history: 2 pack year smoking history.  Quit in [place] Travel History: Lived in [place], [place], [place], [place]  Outpatient Encounter Medications as of 07/30/2017  Medication Sig  . aspirin 81 MG tablet Take 81 mg by mouth daily.    . benzonatate (TESSALON) 200 MG capsule Take 1 capsule (200 mg total) by mouth 3 (three) times daily as needed.  . Calcium-Magnesium-Zinc 167-83-8 MG TABS Take by mouth daily.  . Cyanocobalamin (VITAMIN B 12 PO) Take by mouth daily.  . fluticasone (FLONASE) 50 MCG/ACT nasal spray 1-2 SPRAYS INTO BOTH NOSTRILS DAILY  . hydrochlorothiazide (HYDRODIURIL) 12.5 MG tablet Take 1 tablet (12.5 mg total) by mouth daily.  . metoprolol succinate (TOPROL-XL) 25 MG 24 hr tablet Take 1 tablet (25 mg total) by mouth daily.  . Multiple Vitamin (MULTIVITAMIN) tablet Take 1 tablet by mouth daily.    . niacin 500 MG tablet Take 500 mg by mouth at bedtime.  Marland Kitchen trimethoprim-polymyxin b (POLYTRIM) ophthalmic solution Place 1 drop into both eyes every 6 (six) hours.  . [DISCONTINUED] levofloxacin (LEVAQUIN) 500 MG tablet Take 1 tablet (500 mg total) by mouth daily.  .  [DISCONTINUED] predniSONE (STERAPRED UNI-PAK 21 TAB) 10 MG (21) TBPK tablet As directed x 6 days   No facility-administered encounter medications on file as of 07/30/2017.     Allergies as of 07/30/2017 - Review Complete 07/30/2017  Allergen Reaction Noted  . Erythromycin  02/19/2007  . Hydrocodone-homatropine Other (See Comments) 04/29/2017  . Metoprolol Other (See Comments) 01/05/2017    Past Medical History:  Diagnosis Date  . Allergy   . Hyperlipidemia   . Hypertension     Past Surgical History:  Procedure Laterality Date  . TYMPANOSTOMY TUBE PLACEMENT    . VASECTOMY  04/1995    Family History  Problem Relation Age of Onset  . Hyperlipidemia Mother   . Hypertension Mother   . Coronary artery disease Father   . Stroke Father   . Cancer Father        prostate  . Heart disease Father   . Prostate cancer Father   . COPD Sister   . Diabetes Paternal Uncle   . Diabetes Paternal Grandfather   . Colon cancer Neg Hx     Social History   Socioeconomic History  . Marital status: Married    Spouse name: Not on file  . Number of children: 4  . Years of education: Not on file  . Highest education level: Not on file  Occupational History  . Occupation: Secretary/administrator with companies  Social Needs  . Financial resource strain: Not on file  . Food insecurity:    Worry: Not on file    Inability: Not  on file  . Transportation needs:    Medical: Not on file    Non-medical: Not on file  Tobacco Use  . Smoking status: Former Smoker    Packs/day: 0.50    Years: 4.00    Pack years: 2.00    Types: Cigarettes    Last attempt to quit: 1975    Years since quitting: 44.3  . Smokeless tobacco: Never Used  Substance and Sexual Activity  . Alcohol use: Yes    Alcohol/week: 0.0 oz    Comment: occ glass of wine/week  . Drug use: No  . Sexual activity: Not on file  Lifestyle  . Physical activity:    Days per week: Not on file    Minutes per session: Not on file  .  Stress: Not on file  Relationships  . Social connections:    Talks on phone: Not on file    Gets together: Not on file    Attends religious service: Not on file    Active member of club or organization: Not on file    Attends meetings of clubs or organizations: Not on file    Relationship status: Not on file  . Intimate partner violence:    Fear of current or ex partner: Not on file    Emotionally abused: Not on file    Physically abused: Not on file    Forced sexual activity: Not on file  Other Topics Concern  . Not on file  Social History Narrative   Lives with wife, married since [place], prev widowed   From [place], [place]   4 kids combined with his 2nd wife   Working for [employer] as of 2018    Review of systems: Review of Systems  Constitutional: Negative for fever and chills.  HENT: Negative.   Eyes: Negative for blurred vision.  Respiratory: as per HPI  Cardiovascular: Negative for chest pain and palpitations.  Gastrointestinal: Negative for vomiting, diarrhea, blood per rectum. Genitourinary: Negative for dysuria, urgency, frequency and hematuria.  Musculoskeletal: Negative for myalgias, back pain and joint pain.  Skin: Negative for itching and rash.  Neurological: Negative for dizziness, tremors, focal weakness, seizures and loss of consciousness.  Endo/Heme/Allergies: Negative for environmental allergies.  Psychiatric/Behavioral: Negative for depression, suicidal ideas and hallucinations.  All other systems reviewed and are negative.  Physical Exam: Blood pressure 140/80, pulse 73, height 5\' 9"  (1.753 m), weight 271 lb (122.9 kg), SpO2 93 %. Gen:      No acute distress HEENT:  EOMI, sclera anicteric Neck:     No masses; no thyromegaly Lungs:    Clear to auscultation bilaterally; normal respiratory effort CV:         Regular rate and rhythm; no murmurs Abd:      + bowel sounds; soft, non-tender; no palpable masses, no distension Ext:    No edema; adequate peripheral  perfusion Skin:      Warm and dry; no rash Neuro: alert and oriented x 3 Psych: normal mood and affect  Data Reviewed: FENO 07/30/88-21  Assessment:  Chronic cough Likely secondary to allergies, postnasal drip. Symptoms are improving.  No intervention needed at present  Recommended that he get evaluated for sleep apnea given his body habitus and symptoms of snoring  He will call us when he is ready to get the test done at [facility]   Plan/Recommendations: - Sleep study.  To be scheduled when patient is ready for the test.  Marshell Garfinkel MD [facility]  07/30/2017, 2:54 PM  CC: Tonia Ghent, MD

## 2017-10-06 ENCOUNTER — Other Ambulatory Visit: Payer: Self-pay | Admitting: Family Medicine

## 2017-10-08 NOTE — Telephone Encounter (Signed)
Electronic refill request Last office visit 05/24/17/acute Last refill 02/04/17 #90/3 See allergy/contraindication

## 2017-12-29 ENCOUNTER — Other Ambulatory Visit: Payer: Self-pay | Admitting: Family Medicine

## 2017-12-29 ENCOUNTER — Other Ambulatory Visit: Payer: Self-pay | Admitting: Internal Medicine

## 2017-12-31 NOTE — Telephone Encounter (Signed)
Last filled 10/08/17 by Garnette Gunner, last OV with you was acute 05/2017, please advise if okay to refill

## 2018-01-01 NOTE — Telephone Encounter (Signed)
Please schedule appointment as instructed. 

## 2018-01-01 NOTE — Telephone Encounter (Signed)
PEC scheduled patient-Dustin [name] Dustin [name], RMA

## 2018-01-01 NOTE — Telephone Encounter (Signed)
Left message for patient to call back to schedule-Anastasiya V Hopkins, RMA

## 2018-01-01 NOTE — Telephone Encounter (Signed)
Sent.  Due for CPE this fall.  Thanks.

## 2018-02-11 ENCOUNTER — Other Ambulatory Visit: Payer: BC Managed Care – PPO

## 2018-02-11 ENCOUNTER — Other Ambulatory Visit: Payer: Self-pay | Admitting: Family Medicine

## 2018-02-11 DIAGNOSIS — Z125 Encounter for screening for malignant neoplasm of prostate: Secondary | ICD-10-CM

## 2018-02-11 DIAGNOSIS — I10 Essential (primary) hypertension: Secondary | ICD-10-CM

## 2018-02-14 ENCOUNTER — Other Ambulatory Visit (INDEPENDENT_AMBULATORY_CARE_PROVIDER_SITE_OTHER): Payer: BC Managed Care – PPO

## 2018-02-14 DIAGNOSIS — I10 Essential (primary) hypertension: Secondary | ICD-10-CM

## 2018-02-14 DIAGNOSIS — Z125 Encounter for screening for malignant neoplasm of prostate: Secondary | ICD-10-CM

## 2018-02-14 LAB — COMPREHENSIVE METABOLIC PANEL
ALBUMIN: 4.2 g/dL (ref 3.5–5.2)
ALT: 18 U/L (ref 0–53)
AST: 13 U/L (ref 0–37)
Alkaline Phosphatase: 66 U/L (ref 39–117)
BUN: 18 mg/dL (ref 6–23)
CHLORIDE: 101 meq/L (ref 96–112)
CO2: 32 meq/L (ref 19–32)
Calcium: 9.6 mg/dL (ref 8.4–10.5)
Creatinine, Ser: 0.95 mg/dL (ref 0.40–1.50)
GFR: 84.83 mL/min (ref >60.00)
GLUCOSE: 105 mg/dL — AB (ref 70–99)
Potassium: 4.3 mEq/L (ref 3.5–5.1)
Sodium: 138 mEq/L (ref 135–145)
TOTAL PROTEIN: 6.7 g/dL (ref 6.0–8.3)
Total Bilirubin: 0.5 mg/dL (ref 0.2–1.2)

## 2018-02-14 LAB — LIPID PANEL
CHOLESTEROL: 157 mg/dL (ref 0–200)
HDL: 42.8 mg/dL (ref >39.00)
LDL Cholesterol: 92 mg/dL (ref 0–99)
NONHDL: 114.39
TRIGLYCERIDES: 111 mg/dL (ref 0.0–149.0)
Total CHOL/HDL Ratio: 4
VLDL: 22.2 mg/dL (ref 0.0–40.0)

## 2018-02-14 LAB — PSA: PSA: 0.39 ng/mL (ref 0.10–4.00)

## 2018-02-18 ENCOUNTER — Encounter: Payer: Self-pay | Admitting: Family Medicine

## 2018-02-18 ENCOUNTER — Telehealth: Payer: Self-pay

## 2018-02-18 ENCOUNTER — Ambulatory Visit (INDEPENDENT_AMBULATORY_CARE_PROVIDER_SITE_OTHER): Payer: BC Managed Care – PPO | Admitting: Family Medicine

## 2018-02-18 VITALS — BP 142/84 | HR 65 | Temp 98.4°F | Ht 69.0 in | Wt 277.5 lb

## 2018-02-18 DIAGNOSIS — Z23 Encounter for immunization: Secondary | ICD-10-CM | POA: Diagnosis not present

## 2018-02-18 DIAGNOSIS — G4733 Obstructive sleep apnea (adult) (pediatric): Secondary | ICD-10-CM

## 2018-02-18 DIAGNOSIS — Z789 Other specified health status: Secondary | ICD-10-CM

## 2018-02-18 DIAGNOSIS — Z0000 Encounter for general adult medical examination without abnormal findings: Secondary | ICD-10-CM

## 2018-02-18 DIAGNOSIS — Z7189 Other specified counseling: Secondary | ICD-10-CM

## 2018-02-18 DIAGNOSIS — I10 Essential (primary) hypertension: Secondary | ICD-10-CM

## 2018-02-18 DIAGNOSIS — R0683 Snoring: Secondary | ICD-10-CM

## 2018-02-18 MED ORDER — HYDROCHLOROTHIAZIDE 12.5 MG PO TABS: 12.5000 mg | ORAL_TABLET | Freq: Every day | ORAL | 3 refills | Status: DC

## 2018-02-18 MED ORDER — METOPROLOL SUCCINATE ER 25 MG PO TB24: 25.0000 mg | ORAL_TABLET | Freq: Every day | ORAL | 3 refills | Status: DC

## 2018-02-18 NOTE — Telephone Encounter (Signed)
-----   Message from Marshell Garfinkel, MD sent at 02/18/2018  4:10 PM EST ----- Sure.  Margie- can you order split night sleep study. I believe he wants to get it done at [facility]. Thanks  ----- Message ----- From: Tonia Ghent, MD Sent: 02/18/2018   3:51 PM EST To: Marshell Garfinkel, MD  Saw patient today.  He wants to go through with OSA testing.  Can your clinic contact him?   Thanks.   Brigitte Pulse

## 2018-02-18 NOTE — Patient Instructions (Signed)
Pulmonary should contact you about sleep apnea testing.  Try to get back in the gym.  Call about a colonoscopy.  Update me as needed.  Take care.  Glad to see you.

## 2018-02-18 NOTE — Telephone Encounter (Signed)
Split night has been ordered.  Left detailed message to make pt aware.  Nothing further is needed.

## 2018-02-18 NOTE — Progress Notes (Signed)
CPE- See plan.  Routine anticipatory guidance given to patient.  See health maintenance.  The possibility exists that previously documented standard health maintenance information may have been brought forward from a previous encounter into this note.  If needed, that same information has been updated to reflect the current situation based on today's encounter.    Tetanus 2013 Flu 2019 PNA not due yet.  Shingles d/w pt. Out of stock here.  Had 1st dose at pharmacy.   HIV and HCV neg prev.   PSA wnl. Dw pt. FH noted.  Colonoscopy due.  He'll call about that in early 2020, d/w pt.  Living will d/w pt. Wife designated if patient were incapacitated.  Diet and exercise d/w pt. Encouraged both, discussed.    He has nocturia that is exacerbated by caffeine, d/w pt about taper of caffeine.    Hypertension:    Using medication without problems or lightheadedness: yes Chest pain with exertion: no Edema:no Short of breath: not with stairs.  See below.   We talked about MMR status and options.  We can get a titer with future labs.    He had trouble walking up a hill recently after a basketball game.  He has fatigue.  No CP.  Snoring noted.  No known apneas.  D/w pt about deconditioning and OSA eval.    PMH and SH reviewed  Meds, vitals, and allergies reviewed.   ROS: Per HPI.  Unless specifically indicated otherwise in HPI, the patient denies:  General: fever. Eyes: acute vision changes ENT: sore throat Cardiovascular: chest pain Respiratory: SOB GI: vomiting GU: dysuria Musculoskeletal: acute back pain Derm: acute rash Neuro: acute motor dysfunction Psych: worsening mood Endocrine: polydipsia Heme: bleeding Allergy: hayfever  GEN: nad, alert and oriented HEENT: mucous membranes moist NECK: supple w/o LA CV: rrr. PULM: ctab, no inc wob ABD: soft, +bs EXT: no edema SKIN: no acute rash

## 2018-02-20 DIAGNOSIS — R0683 Snoring: Secondary | ICD-10-CM | POA: Insufficient documentation

## 2018-02-20 NOTE — Assessment & Plan Note (Signed)
No change in meds at this point.  Discussed diet and exercise and sleep apnea testing.  He will follow-up with pulmonary about sleep apnea testing.

## 2018-02-20 NOTE — Assessment & Plan Note (Signed)
Wife designated if patient were incapacitated.  

## 2018-02-20 NOTE — Assessment & Plan Note (Signed)
Tetanus 2013 Flu 2019 PNA not due yet.  Shingles d/w pt. Out of stock here.  Had 1st dose at pharmacy.   HIV and HCV neg prev.   PSA wnl. Dw pt. FH noted.  Colonoscopy due.  He'll call about that in early 2020, d/w pt.  Living will d/w pt. Wife designated if patient were incapacitated.  Diet and exercise d/w pt. Encouraged both, discussed.

## 2018-02-20 NOTE — Assessment & Plan Note (Signed)
He may have significant sleep apnea that could be affecting his sleep, fatigue, blood pressure, etc.  I checked with pulmonary and patient should be contacted about sleep study testing.  See after visit summary.  I appreciate the help of all involved.

## 2018-03-08 ENCOUNTER — Encounter: Payer: Self-pay | Admitting: Family Medicine

## 2018-03-08 ENCOUNTER — Ambulatory Visit: Payer: BC Managed Care – PPO | Admitting: Family Medicine

## 2018-03-08 DIAGNOSIS — J069 Acute upper respiratory infection, unspecified: Secondary | ICD-10-CM

## 2018-03-08 MED ORDER — AMOXICILLIN-POT CLAVULANATE 875-125 MG PO TABS: 1.0000 | ORAL_TABLET | Freq: Two times a day (BID) | ORAL | 0 refills | Status: DC

## 2018-03-08 NOTE — Progress Notes (Signed)
duration of symptoms: about 6 days Rhinorrhea: yes, discolored, using a neti pot in the meantime.   congestion: yes ear pain:no sore throat:yes Cough: yes, but usually dry Myalgias:no No fevers known.   No vomiting, no diarrhea.   General malaise.   Compared to yesterday, he feels a little better.    He is leaving town in about 1 week.   Per HPI unless specifically indicated in ROS section   Meds, vitals, and allergies reviewed.   GEN: nad, alert and oriented HEENT: mucous membranes moist, TM w/o erythema, nasal epithelium injected, OP with cobblestoning NECK: supple w/o LA CV: rrr. PULM: ctab, no inc wob ABD: soft, +bs EXT: no edema Sinuses not tender to palpation.

## 2018-03-08 NOTE — Patient Instructions (Signed)
Rest and fluids.   Use flonase in the meantime.  Hold the augmentin for now.  Start if worse.  Take care.  Glad to see you.

## 2018-03-10 NOTE — Assessment & Plan Note (Signed)
Rest and fluids.   Use flonase in the meantime.  Hold the augmentin for now.  Start if worse.  He agrees.  It could be that he has a viral issue that is going to self resolve in the near future.  Update me as needed.  Nontoxic.

## 2018-04-04 ENCOUNTER — Ambulatory Visit (HOSPITAL_BASED_OUTPATIENT_CLINIC_OR_DEPARTMENT_OTHER): Payer: BC Managed Care – PPO | Attending: Pulmonary Disease | Admitting: Pulmonary Disease

## 2018-04-04 DIAGNOSIS — Z79899 Other long term (current) drug therapy: Secondary | ICD-10-CM | POA: Diagnosis not present

## 2018-04-04 DIAGNOSIS — G4733 Obstructive sleep apnea (adult) (pediatric): Secondary | ICD-10-CM

## 2018-04-05 DIAGNOSIS — G4733 Obstructive sleep apnea (adult) (pediatric): Secondary | ICD-10-CM

## 2018-04-05 NOTE — Procedures (Signed)
    Patient Name: Dustin [name], Dustin [name] Date: 04/04/2018 Gender: Male D.O.B: 12-30-1955 Age (years): 21 Referring Provider: Marshell Garfinkel Height (inches): 68 Interpreting Physician: Chesley Mires MD, ABSM Weight (lbs): 270 RPSGT: Earney [place] BMI: 41 MRN: [id number] Neck Size: 19.00 <br> <br>  CLINICAL INFORMATION  Sleep Study Type: NPSG  Indication for sleep study: OSA, Snoring  Epworth Sleepiness Score: 3  SLEEP STUDY TECHNIQUE  As per the AASM Manual for the Scoring of Sleep and Associated Events v2.3 (April 2016) with a hypopnea requiring 4% desaturations. The channels recorded and monitored were frontal, central and occipital EEG, electrooculogram (EOG), submentalis EMG (chin), nasal and oral airflow, thoracic and abdominal wall motion, anterior tibialis EMG, snore microphone, electrocardiogram, and pulse oximetry. MEDICATIONS  Medications self-administered by patient taken the night of the study : SK-HYDROCHLOROTHIAZIDE, METOPROLOL SUCCINATE SLEEP ARCHITECTURE  The study was initiated at 9:42:20 PM and ended at 4:18:52 AM. Sleep onset time was 15.4 minutes and the sleep efficiency was 30.1%%. The total sleep time was 119.5 minutes. Stage REM latency was N/A minutes. The patient spent 2.9%% of the night in stage N1 sleep, 97.1%% in stage N2 sleep, 0.0%% in stage N3 and 0% in REM. Alpha intrusion was absent. Supine sleep was 32.46%. He had difficulty maintaining sleep related to respiratory events. RESPIRATORY PARAMETERS  The overall apnea/hypopnea index (AHI) was 6.5 per hour. There were 8 total apneas, including 8 obstructive, 0 central and 0 mixed apneas. There were 5 hypopneas and 12 RERAs. The AHI during Stage REM sleep was N/A per hour. AHI while supine was 3.1 per hour. The mean oxygen saturation was 92.2%. The minimum SpO2 during sleep was 80.0%. loud snoring was noted during this study. CARDIAC DATA  The 2 lead EKG demonstrated sinus rhythm. The mean  heart rate was 62.6 beats per minute. Other EKG findings include: PVCs. LEG MOVEMENT DATA  The total PLMS were 0 with a resulting PLMS index of 0.0. Associated arousal with leg movement index was 0.0 . IMPRESSIONS  - Mild obstructive sleep apnea with an AHI of 6.5 and SpO2 low of 80%. DIAGNOSIS  - Obstructive Sleep Apnea (327.23 [G47.33 ICD-10]) RECOMMENDATIONS  - Additional therapies include weight loss, CPAP, oral appliance, or surgical assessment.  [Electronically signed] 04/05/2018 05:55 PM  Chesley Mires MD, [place], American Board of Sleep Medicine  NPI: [id number]

## 2018-04-06 ENCOUNTER — Other Ambulatory Visit: Payer: Self-pay | Admitting: Family Medicine

## 2018-04-07 ENCOUNTER — Encounter: Payer: Self-pay | Admitting: Family Medicine

## 2018-04-21 ENCOUNTER — Encounter: Payer: Self-pay | Admitting: Family Medicine

## 2018-04-22 ENCOUNTER — Telehealth: Payer: Self-pay

## 2018-04-22 NOTE — Telephone Encounter (Signed)
-----   Message from Marshell Garfinkel, MD sent at 04/22/2018  1:38 PM EST ----- Please let patient know that the sleep study shows mild sleep apnea and low O2 levels at night.  We can attempt a trial of CPAP to see if it helps him sleep better. Order autoset CPAP 5-15 cm and follow up with download. ----- Message ----- From: Chesley Mires, MD Sent: 04/05/2018   5:57 PM EST To: Tonia Ghent, MD, Marshell Garfinkel, MD

## 2018-04-22 NOTE — Telephone Encounter (Signed)
Pt is aware of below results/recommendations.  Pt stated that he would like to think about cpap therapy. Pt has been scheduled for OV on 05/10/18 to discuss sleep study results further. Nothing further is needed.

## 2018-05-10 ENCOUNTER — Encounter: Payer: Self-pay | Admitting: Pulmonary Disease

## 2018-05-10 ENCOUNTER — Ambulatory Visit: Payer: BC Managed Care – PPO | Admitting: Pulmonary Disease

## 2018-05-10 VITALS — BP 138/80 | HR 65 | Ht 68.0 in | Wt 276.2 lb

## 2018-05-10 DIAGNOSIS — R05 Cough: Secondary | ICD-10-CM

## 2018-05-10 DIAGNOSIS — G4733 Obstructive sleep apnea (adult) (pediatric): Secondary | ICD-10-CM | POA: Diagnosis not present

## 2018-05-10 DIAGNOSIS — R059 Cough, unspecified: Secondary | ICD-10-CM

## 2018-05-10 NOTE — Patient Instructions (Signed)
I have reviewed your sleep study which shows mild sleep apnea with low oxygen levels at night We will start you on CPAP therapy AutoSet 5-15 Please work on weight loss with diet and exercise Follow-up in 3 months.

## 2018-05-10 NOTE — Progress Notes (Signed)
Dustin [name]    [id number]    [birth year]  Primary Care Physician:Duncan, Elveria Rising, MD  Referring Physician: Tonia Ghent, MD [address] [place], [place] [address]  Chief complaint: Follow up for cough, OSA  HPI: 63 year old with past medical history of hypertension, allergies developed a respiratory tract infection in December 2018 and developed a lingering nonproductive cough that lasted until January.  Denies any dyspnea, mucus production, wheezing, fevers, chills His symptoms are spontaneously improved as the weather got warmer.  Currently he has very mild symptoms of occasional cough. Reports excessive snoring.  Pets: Has a dog.  Outside cats.  No birds, farm animals Occupation: Works as a Education administrator Exposures: No known exposures, no dampness, mold, hot tubs Smoking history: 2 pack year smoking history.  Quit in 1985 Travel History: Lived in [place], [place], [place], [place]  Interim history: Underwent a sleep study in December.  Reports that he got 2 hours of sleep and had a poor experience in the sleep lab. He is here for review of the sleep study results.  States that his cough is at baseline.  Coughs for a bit in the morning and is clear for the rest of the day Continues to snore with daytime sleepiness.  Outpatient Encounter Medications as of 05/10/2018  Medication Sig  . aspirin 81 MG tablet Take 81 mg by mouth daily.    . Calcium-Magnesium-Zinc 167-83-8 MG TABS Take by mouth daily.  . Cyanocobalamin (VITAMIN B 12 PO) Take by mouth daily.  . fluticasone (FLONASE) 50 MCG/ACT nasal spray USE 1-2 SPRAYS IN BOTH NOSTRILS EVERY DAY  . hydrochlorothiazide (HYDRODIURIL) 12.5 MG tablet Take 1 tablet (12.5 mg total) by mouth daily.  . metoprolol succinate (TOPROL-XL) 25 MG 24 hr tablet Take 1 tablet (25 mg total) by mouth daily.  . Multiple Vitamin (MULTIVITAMIN) tablet Take 1 tablet by mouth daily.    . niacin 500 MG tablet Take 500 mg by  mouth at bedtime.  . [DISCONTINUED] amoxicillin-clavulanate (AUGMENTIN) 875-125 MG tablet Take 1 tablet by mouth 2 (two) times daily.   No facility-administered encounter medications on file as of 05/10/2018.    Physical Exam: Blood pressure 138/80, pulse 65, height 5\' 8"  (1.727 m), weight 276 lb 3.2 oz (125.3 kg), SpO2 95 %. Gen:      No acute distress HEENT:  EOMI, sclera anicteric Neck:     No masses; no thyromegaly Lungs:    Clear to auscultation bilaterally; normal respiratory effort CV:         Regular rate and rhythm; no murmurs Abd:      + bowel sounds; soft, non-tender; no palpable masses, no distension Ext:    No edema; adequate peripheral perfusion Skin:      Warm and dry; no rash Neuro: alert and oriented x 3 Psych: normal mood and affect  Data Reviewed: FENO 07/30/88-21  Sleep Study 04/04/18 Mild obstructive sleep apnea with an AHI of 6.5 and SpO2 low of 80%.  Assessment:  Chronic cough Likely secondary to allergies, postnasal drip. Continue Flonase and saline nasal spray use  Sleep apnea Sleep study reviewed with mild apnea and desats We will start CPAP therapy AutoSet Follow-up in 3 months to review compliance. He will need an overnight oximetry once he is set up with CPAP to make sure the desats are improved.   Plan/Recommendations: - Autoset CPAP 5-15 cm  Marshell Garfinkel MD [facility] 05/10/2018,  9:31 AM  CC: Tonia Ghent, MD

## 2018-05-13 ENCOUNTER — Ambulatory Visit: Payer: BC Managed Care – PPO | Admitting: Family Medicine

## 2018-05-13 ENCOUNTER — Encounter: Payer: Self-pay | Admitting: Family Medicine

## 2018-05-13 DIAGNOSIS — J069 Acute upper respiratory infection, unspecified: Secondary | ICD-10-CM

## 2018-05-13 MED ORDER — AMOXICILLIN-POT CLAVULANATE 875-125 MG PO TABS: 1.0000 | ORAL_TABLET | Freq: Two times a day (BID) | ORAL | 0 refills | Status: DC

## 2018-05-13 NOTE — Progress Notes (Signed)
Sx started about 2-3 weeks ago. Still stuffy.  Green rhinorrhea.  Using nasal saline rinse with some relief.  No fevers.  No vomiting.  No ear pain.  Some HA/facial pain.  A little cough, not much.  No sputum.    Meds, vitals, and allergies reviewed.   ROS: Per HPI unless specifically indicated in ROS section   GEN: nad, alert and oriented HEENT: mucous membranes moist, tm w/o erythema, nasal exam w/o erythema, clear discharge noted,  OP with cobblestoning NECK: supple w/o LA CV: rrr.   PULM: ctab, no inc wob EXT: no edema SKIN: Well-perfused

## 2018-05-13 NOTE — Patient Instructions (Signed)
Start augmentin, use nasal rinse, continue flonase and update me as needed.  Take care.  Glad to see you.

## 2018-05-15 NOTE — Assessment & Plan Note (Signed)
Likely sinusitis Start augmentin, use nasal rinse, continue flonase and update me as needed.  Plan discussed with patient.  He agrees.  Nontoxic and okay for outpatient follow-up.

## 2018-05-21 NOTE — Telephone Encounter (Signed)
I called pt to discuss email. There was no answer so I left a message to call back. I have printed sleep study again and will mail to pt.

## 2018-05-21 NOTE — Telephone Encounter (Signed)
Patient returned phone call.  Phone number is [phone].

## 2018-05-21 NOTE — Telephone Encounter (Signed)
Patient returned call, CB is [phone].  States will be in meetings until 4 pm today and asks we call back after 4 pm.

## 2018-05-22 ENCOUNTER — Telehealth: Payer: Self-pay | Admitting: Pulmonary Disease

## 2018-05-22 NOTE — Telephone Encounter (Signed)
Spoke with pt over the phone and advised him that I sent Rx and sleep study results to his address. He asked about the switch to [facility] in [place]. I advised him that [facility] will deliver and come to his house if he is unable to attend the class in [place]. He agreed to stay with [facility]. I contacted Barnetta Chapel, our rep, and she stated she would call pt and set up a time to come to his house for CPAP set up. Nothing further is needed.

## 2018-05-22 NOTE — Telephone Encounter (Signed)
Forwarding to Dr. Herma Ard as Juluis Rainier.

## 2018-05-22 NOTE — Telephone Encounter (Signed)
I spoke to pt on another encounter through mychart. Please see message from 05/22/2018.

## 2018-05-22 NOTE — Telephone Encounter (Signed)
Noted  

## 2018-06-06 ENCOUNTER — Telehealth: Payer: BC Managed Care – PPO | Admitting: Physician Assistant

## 2018-06-06 ENCOUNTER — Encounter: Payer: Self-pay | Admitting: Family Medicine

## 2018-06-06 DIAGNOSIS — R05 Cough: Secondary | ICD-10-CM

## 2018-06-06 DIAGNOSIS — R058 Other specified cough: Secondary | ICD-10-CM

## 2018-06-06 DIAGNOSIS — J329 Chronic sinusitis, unspecified: Secondary | ICD-10-CM

## 2018-06-06 NOTE — Progress Notes (Signed)
Based on what you shared with me it looks like you have a serious condition that should be evaluated in a face to face office visit. Giving you were recently seen and treated for this with recurring symptoms, you will need to be seen in person for physical examination and assessment to ensure proper treatment is given.   NOTE: If you entered your credit card information for this eVisit, you will not be charged. You may see a "hold" on your card for the $30 but that hold will drop off and you will not have a charge processed.  If you are having a true medical emergency please call 911.  If you need an urgent face to face visit, [facility] has four urgent care centers for your convenience.  If you need care fast and have a high deductible or no insurance consider:   [email or web address] to reserve your spot online an avoid wait times  [facility] [address] [place], [place] [address] 8 am to 8 pm Monday-Friday 10 am to 4 pm Saturday-Sunday *Across the street from [employer]  [place], [address] 8 am to 5 pm Monday-Friday * In the [facility] on the [facility]   The following sites will take your  insurance:  . [facility] [place] a Provider at this Location  [address] [place], [place] [address] . 10 am to 8 pm Monday-Friday . 12 pm to 8 pm Saturday-Sunday   . [facility] [place] a Provider at this Location  [facility], [place], [place] [address] . 8 am to 8 pm Monday-Friday . 9 am to 6 pm Saturday . 11 am to 6 pm Sunday   . [facility] at [place] Get Driving Directions  [address], [place] [address] . 8 am to 8 pm Monday-Friday . 8 am to 4 pm Saturday-Sunday   Your e-visit  answers were reviewed by a board certified advanced clinical practitioner to complete your personal care plan.  Thank you for using e-Visits.

## 2018-06-07 ENCOUNTER — Encounter: Payer: Self-pay | Admitting: Family Medicine

## 2018-06-07 ENCOUNTER — Ambulatory Visit: Payer: BC Managed Care – PPO | Admitting: Family Medicine

## 2018-06-07 VITALS — BP 140/80 | HR 71 | Temp 98.2°F | Ht 68.0 in | Wt 273.6 lb

## 2018-06-07 DIAGNOSIS — J329 Chronic sinusitis, unspecified: Secondary | ICD-10-CM | POA: Diagnosis not present

## 2018-06-07 DIAGNOSIS — J069 Acute upper respiratory infection, unspecified: Secondary | ICD-10-CM | POA: Diagnosis not present

## 2018-06-07 MED ORDER — HYDROCODONE-HOMATROPINE 5-1.5 MG/5ML PO SYRP: 5.0000 mL | ORAL_SOLUTION | Freq: Three times a day (TID) | ORAL | 0 refills | Status: DC | PRN

## 2018-06-07 MED ORDER — AMOXICILLIN-POT CLAVULANATE 875-125 MG PO TABS: 1.0000 | ORAL_TABLET | Freq: Two times a day (BID) | ORAL | 0 refills | Status: DC

## 2018-06-07 MED ORDER — BENZONATATE 200 MG PO CAPS: 200.0000 mg | ORAL_CAPSULE | Freq: Three times a day (TID) | ORAL | 1 refills | Status: DC | PRN

## 2018-06-07 NOTE — Progress Notes (Signed)
He was clearly better and back to normal after prev augmentin course in 04/2017. He joined the gym, started using CPAP.   Then he got sick again.    duration of symptoms:~1 week, had been out of work this week.   Rhinorrhea: improved with guaifenesin  Congestion: yes, ears are stuffy.   ear pain: no sore throat: yes, from post nasal gtt.  Cough: yes, more productive recently.    Myalgias: no No fevers.    Checking flu test at this point wouldn't change mgmt now, pt agreed.    He tried using guaifenesin and hydrocodone this week.  It helped a little and he didn't recall clear ADE other than some irritability on guaifenesin.  Allergy list updated.    He has had recurrent issues in the last few winters.  D/w pt about ENT eval.    Per HPI unless specifically indicated in ROS section   Meds, vitals, and allergies reviewed.   GEN: nad, alert and oriented HEENT: mucous membranes moist, TM w/o erythema, nasal epithelium injected with purulent discharge R>L, OP with cobblestoning, R>L conjunctival injection  NECK: supple w/o LA CV: rrr. PULM: ctab, no inc wob ABD: soft, +bs EXT: no edema

## 2018-06-07 NOTE — Assessment & Plan Note (Signed)
Recurrent sx, now 1 week with current episode.  Restart augmentin, use tessaon and hydodan as needed- opiate caution d/w pt, rest and fluids.  Update me as needed.  Goal to see ENT in about 2 weeks for eval when better as he has had recurrent sx.  He agrees.  App help of all involved.

## 2018-06-07 NOTE — Patient Instructions (Signed)
We'll call about the ENT referral.   Restart augmentin, use the cough medicines as needed- sedation caution, rest and fluids.   Update me as needed.  Take care.  Glad to see you.

## 2018-07-11 ENCOUNTER — Telehealth: Payer: Self-pay | Admitting: Pulmonary Disease

## 2018-07-11 ENCOUNTER — Encounter: Payer: Self-pay | Admitting: Family Medicine

## 2018-07-11 NOTE — Telephone Encounter (Signed)
error 

## 2018-08-09 ENCOUNTER — Ambulatory Visit: Payer: BC Managed Care – PPO | Admitting: Pulmonary Disease

## 2018-10-31 ENCOUNTER — Encounter: Payer: Self-pay | Admitting: Family Medicine

## 2018-11-01 ENCOUNTER — Other Ambulatory Visit: Payer: Self-pay

## 2018-11-01 ENCOUNTER — Ambulatory Visit: Payer: BC Managed Care – PPO | Admitting: Family Medicine

## 2018-11-01 ENCOUNTER — Encounter: Payer: Self-pay | Admitting: Family Medicine

## 2018-11-01 VITALS — BP 136/84 | HR 78 | Temp 98.5°F | Ht 68.0 in | Wt 277.4 lb

## 2018-11-01 DIAGNOSIS — Z23 Encounter for immunization: Secondary | ICD-10-CM | POA: Diagnosis not present

## 2018-11-01 DIAGNOSIS — L03119 Cellulitis of unspecified part of limb: Secondary | ICD-10-CM

## 2018-11-01 MED ORDER — CEPHALEXIN 500 MG PO CAPS: 500.0000 mg | ORAL_CAPSULE | Freq: Three times a day (TID) | ORAL | 0 refills | Status: DC

## 2018-11-01 NOTE — Patient Instructions (Signed)
If worse in the meantime then update [place].   Tetanus shot today.  Start keflex.  Take care.  Glad to see you.

## 2018-11-01 NOTE — Telephone Encounter (Signed)
Appointment scheduled.

## 2018-11-01 NOTE — Progress Notes (Signed)
[place] and scraped his leg.  Bled some at the time but it didn't seem like a big issue.  No suture, etc.  This was 2 weeks ago.  Now recently with local soreness to touch.  Locally reddish.  Not draining.  No fevers.  He feels well o/w.   No fevers.  No systemic symptoms.  Meds, vitals, and allergies reviewed.   ROS: Per HPI unless specifically indicated in ROS section   nad Well-appearing Shin with narrow and scabbed 2.5cm lac with 3x5 cm blanching pink changes surrounding the laceration.  No red streaks emanating from the lesion.  No drainage.  No ulceration. No concern for FB, no foreign body seen.

## 2018-11-03 DIAGNOSIS — L0390 Cellulitis, unspecified: Secondary | ICD-10-CM | POA: Insufficient documentation

## 2018-11-03 NOTE — Assessment & Plan Note (Signed)
Appears to be a limited and superficial cellulitis. If worse in the meantime then update [place].   Tetanus shot today.  Start keflex in the meantime.  Routine care otherwise.  He agrees.  Okay for outpatient follow-up.

## 2018-12-19 ENCOUNTER — Telehealth: Payer: Self-pay | Admitting: Family Medicine

## 2018-12-19 ENCOUNTER — Ambulatory Visit (INDEPENDENT_AMBULATORY_CARE_PROVIDER_SITE_OTHER): Payer: BC Managed Care – PPO | Admitting: Family Medicine

## 2018-12-19 ENCOUNTER — Other Ambulatory Visit: Payer: Self-pay

## 2018-12-19 DIAGNOSIS — J069 Acute upper respiratory infection, unspecified: Secondary | ICD-10-CM

## 2018-12-19 MED ORDER — AMOXICILLIN-POT CLAVULANATE 875-125 MG PO TABS: 1.0000 | ORAL_TABLET | Freq: Two times a day (BID) | ORAL | 0 refills | Status: DC

## 2018-12-19 NOTE — Telephone Encounter (Signed)
Can we do a phone visit at [place] today?

## 2018-12-19 NOTE — Telephone Encounter (Addendum)
Pt c/o pain in ear x last few days Little congestion in head but no discolored mucous when blowing nose Little headache, Cough in AM - dry, deep cough Denies SOB, chest tightness Using Sinus nasal rinses, no OTC meds  Pt states that he gets a sinus infection like this every year  Pt leaving for out of town tomorrow and he wants something called in before he leaves  Pharmacy - [facility]  Allergies  Allergen Reactions  . Antihistamines, Diphenhydramine-Type Other (See Comments)    Insomnia with benadryl, but able to tolerate claritin  . Erythromycin     REACTION: GI UPSET  . Guaifenesin & Derivatives Other (See Comments)    Irritable but able to tolerate med if needed.   . Metoprolol Other (See Comments)    Max tolerated dose 50mg  day, not an allergy   Please advise - recommendations vs virtual visit today

## 2018-12-19 NOTE — Telephone Encounter (Signed)
Pt is scheduled today at 3pm.

## 2018-12-19 NOTE — Progress Notes (Signed)
Interactive audio and video telecommunications were attempted between this provider and patient, however failed, due to patient having technical difficulties OR patient did not have access to video capability.  We continued and completed visit with audio only.   Virtual Visit via Telephone Note  I connected with patient on 12/19/18  at 3:17 PM  by telephone and verified that I am speaking with the correct person using two identifiers.  Location of patient: home.    Location of MD: [facility] Name of referring provider (if blank then none associated): Names per persons and role in encounter:  MD: Earlyne Iba, Patient: name listed above.    I discussed the limitations, risks, security and privacy concerns of performing an evaluation and management service by telephone and the availability of in person appointments. I also discussed with the patient that there may be a patient responsible charge related to this service. The patient expressed understanding and agreed to proceed.  CC: uri sx.    History of Present Illness:  Sx started a few weeks ago.  He has h/o yearly sinus infections.  Ears felt clogged and had head pressure.  Going on for about 2 weeks.   HA today, ear pain today.  No discolored nasal discharged.    He is travelling tomorrow, driving to [facility]  He has been working from home.  No known covid exposure.  He has been masking.  No fevers.  No loss of taste or smell.  Not SOB.  Not having chest sx except for limited AM cough.  No wheeze.  He was going to be staying at a hotel, not with family. Pandemic considerations d/w pt.    Observations/Objective: No apparent distress Speech normal  Assessment and Plan: Okay for outpatient follow-up.  History of sinusitis.  We discussed options.  Presumed sinusitis Defer covid testing, no known exposure.  Routine pandemic considerations discussed. Start augmentin.  Update me as needed.  He agrees.    Follow Up Instructions: See  above.   I discussed the assessment and treatment plan with the patient. The patient was provided an opportunity to ask questions and all were answered. The patient agreed with the plan and demonstrated an understanding of the instructions.   The patient was advised to call back or seek an in-person evaluation if the symptoms worsen or if the condition fails to improve as anticipated.  I provided 15 minutes of non-face-to-face time during this encounter.   Elsie Stain, MD

## 2018-12-20 NOTE — Telephone Encounter (Signed)
Noted  

## 2018-12-23 NOTE — Assessment & Plan Note (Signed)
Okay for outpatient follow-up.  History of sinusitis.  We discussed options.  Presumed sinusitis. Defer covid testing, no known exposure.  Routine pandemic considerations discussed. Start augmentin.  Update me as needed.  He agrees.

## 2019-01-03 ENCOUNTER — Other Ambulatory Visit: Payer: Self-pay | Admitting: Family Medicine

## 2019-01-03 DIAGNOSIS — J329 Chronic sinusitis, unspecified: Secondary | ICD-10-CM

## 2019-01-03 DIAGNOSIS — H6690 Otitis media, unspecified, unspecified ear: Secondary | ICD-10-CM

## 2019-01-03 MED ORDER — CEFDINIR 300 MG PO CAPS: 300.0000 mg | ORAL_CAPSULE | Freq: Two times a day (BID) | ORAL | 0 refills | Status: AC

## 2019-01-03 NOTE — Progress Notes (Unsigned)
Received a call from [facility]. Patient has been off his Levaquin for 5 days and his ear pain is returning. No fevers, chills or systemic complaints.  Have prescribed a 7 day course of Cefdinir and referred him to ENT for further evaluation of sinusitis and otitis media

## 2019-01-06 ENCOUNTER — Telehealth: Payer: Self-pay

## 2019-01-06 NOTE — Telephone Encounter (Signed)
Please refer to Dr. Azalee Course note documented for on call response on Friday 9/18.  She called in abx for ear pain and referred him to ENT to follow up with ongoing symptoms.   FYI to PCP.

## 2019-01-06 NOTE — Telephone Encounter (Signed)
[place] Night - Client TELEPHONE ADVICE RECORD [facility] Patient Name: Dustin [name] Gender: Male DOB: 08-29-1955 Age: 63 Y 10 M 18 D Return Phone Number: [phone] (Primary) Address: City/State/Zip: [place] [address] Client [facility] Night - Client Client Site [place] Physician Renford Dills - MD Contact Type Call Who Is Calling Patient / Member / Family / Caregiver Call Type Triage / Clinical Relationship To Patient Self Return Phone Number [phone] (Primary) Chief Complaint Earache Reason for Call Symptomatic / Request for [place] states he has pressure and pain in his ears. He was given an antibiotics 2 weeks ago. He is about the same or a little worse. Needs a new antibiotics. Translation No Nurse Assessment Nurse: Fabio Neighbors, RN, Kitty Date/Time (Eastern Time): 01/03/2019 6:14:01 PM Confirm and document reason for call. If symptomatic, describe symptoms. ---Caller states that he is having pressure/pain behind his ears. He has been treated for ear infections. Pain level 1/10. Hearing is diminished. NKDA, [facility] [phone]. Has the patient had close contact with a person known or suspected to have the novel coronavirus illness OR traveled / lives in area with major community spread (including international travel) in the last 14 days from the onset of symptoms? * If Asymptomatic, screen for exposure and travel within the last 14 days. ---No Does the patient have any new or worsening symptoms? ---Yes Will a triage be completed? ---Yes Related visit to physician within the last 2 weeks? ---Yes Does the PT have any chronic conditions? (i.e. diabetes, asthma, this includes High risk factors for pregnancy, etc.) ---Yes List chronic conditions. ---HTN Is this a behavioral health or substance abuse call? ---No Guidelines Guideline  Title Affirmed Question Affirmed Notes Nurse Date/Time (Eastern Time) Earache Earache (Exceptions: brief ear pain of < 60 minutes duration, earache occurring during air travel [place], [place], Perrin Smack 01/03/2019 6:17:01 PM PLEASE NOTE: All timestamps contained within this report are represented as [place] Standard Time. CONFIDENTIALTY NOTICE: This fax transmission is intended only for the addressee. It contains information that is legally privileged, confidential or otherwise protected from use or disclosure. If you are not the intended recipient, you are strictly prohibited from reviewing, disclosing, copying using or disseminating any of this information or taking any action in reliance on or regarding this information. If you have received this fax in error, please notify us immediately by telephone so that we can arrange for its return to [place]. Phone: [phone], Toll-Free: [phone], Fax: [phone] Page: 2 of 2 Call Id: [id number] [place]. Time Eilene Ghazi Time) Disposition Final User 01/03/2019 6:29:43 PM Paged On Call back to [facility], RN, Perrin Smack 01/03/2019 6:30:38 PM Paged On Call back to [facility] [place], [place] 01/03/2019 6:29:59 PM See PCP within 24 Hours Yes Fabio Neighbors, RN, Perrin Smack Caller Disagree/Comply Comply Caller Understands Yes PreDisposition Call Doctor Care Advice Given Per Guideline SEE PCP WITHIN 24 HOURS: Comments User: Romie Jumper, RN Date/Time Eilene Ghazi Time): 01/03/2019 6:38:01 PM Caller updated on physician orders. Verbalized understanding. Referrals REFERRED TO PCP OFFICE Paging DoctorName Phone DateTime Result/Outcome Message Type Notes Penni Homans - MD [id number] 01/03/2019 6:29:43 PM Paged On Call Back to Call Center Doctor Paged Penni Homans - MD 01/03/2019 6:37:00 PM Spoke with On Call - [place] with MD. Physician is order cefdinir and order an ENT consult

## 2019-01-07 NOTE — Telephone Encounter (Signed)
Noted. Referral is in epic.  Thanks.

## 2019-02-11 ENCOUNTER — Other Ambulatory Visit: Payer: Self-pay | Admitting: Family Medicine

## 2019-02-11 DIAGNOSIS — I10 Essential (primary) hypertension: Secondary | ICD-10-CM

## 2019-02-11 DIAGNOSIS — Z125 Encounter for screening for malignant neoplasm of prostate: Secondary | ICD-10-CM

## 2019-02-17 ENCOUNTER — Encounter: Payer: Self-pay | Admitting: Family Medicine

## 2019-02-19 ENCOUNTER — Other Ambulatory Visit: Payer: Self-pay

## 2019-02-19 ENCOUNTER — Encounter: Payer: Self-pay | Admitting: *Deleted

## 2019-02-20 NOTE — Discharge Instructions (Signed)
[facility] °[facility] °ENDOSCOPIC SINUS SURGERY °[facility] ° °What is Functional Endoscopic Sinus Surgery? ° The Surgery involves making the natural openings of the sinuses larger by removing the bony partitions that separate the sinuses from the nasal cavity.  The natural sinus lining is preserved as much as possible to allow the sinuses to resume normal function after the surgery.  In some patients nasal polyps (excessively swollen lining of the sinuses) may be removed to relieve obstruction of the sinus openings.  The surgery is performed through the nose using lighted scopes, which eliminates the need for incisions on the face.  A septoplasty is a different procedure which is sometimes performed with sinus surgery.  It involves straightening the boy partition that separates the two sides of your nose.  A crooked or deviated septum may need repair if is obstructing the sinuses or nasal airflow.  Turbinate reduction is also often performed during sinus surgery.  The turbinates are bony proturberances from the side walls of the nose which swell and can obstruct the nose in patients with sinus and allergy problems.  Their size can be surgically reduced to help relieve nasal obstruction. ° °What Can Sinus Surgery Do For Me? ° Sinus surgery can reduce the frequency of sinus infections requiring antibiotic treatment.  This can provide improvement in nasal congestion, post-nasal drainage, facial pressure and nasal obstruction.  Surgery will NOT prevent you from ever having an infection again, so it usually only for patients who get infections 4 or more times yearly requiring antibiotics, or for infections that do not clear with antibiotics.  It will not cure nasal allergies, so patients with allergies may still require medication to treat their allergies after surgery. Surgery may improve headaches related to sinusitis, however, some people will continue to  require medication to control sinus headaches related to allergies.  Surgery will do nothing for other forms of headache (migraine, tension or cluster). ° °What Are the Risks of Endoscopic Sinus Surgery? ° Current techniques allow surgery to be performed safely with little risk, however, there are rare complications that patients should be aware of.  Because the sinuses are located around the eyes, there is risk of eye injury, including blindness, though again, this would be quite rare. This is usually a result of bleeding behind the eye during surgery, which puts the vision oat risk, though there are treatments to protect the vision and prevent permanent disrupted by surgery causing a leak of the spinal fluid that surrounds the brain.  More serious complications would include bleeding inside the brain cavity or damage to the brain.  Again, all of these complications are uncommon, and spinal fluid leaks can be safely managed surgically if they occur.  The most common complication of sinus surgery is bleeding from the nose, which may require packing or cauterization of the nose.  Continued sinus have polyps may experience recurrence of the polyps requiring revision surgery.  Alterations of sense of smell or injury to the tear ducts are also rare complications.  ° °What is the Surgery Like, and what is the Recovery? ° The Surgery usually takes a couple of hours to perform, and is usually performed under a general anesthetic (completely asleep).  Patients are usually discharged home after a couple of hours.  Sometimes during surgery it is necessary to pack the nose to control bleeding, and the packing is left in place for 24 - 48 hours, and removed by your surgeon.    If a septoplasty was performed during the procedure, there is often a splint placed which must be removed after 5-7 days.   °Discomfort: Pain is usually mild to moderate, and can be controlled by prescription pain medication or acetaminophen (Tylenol).   Aspirin, Ibuprofen (Advil, Motrin), or Naprosyn (Aleve) should be avoided, as they can cause increased bleeding.  Most patients feel sinus pressure like they have a bad head cold for several days.  Sleeping with your head elevated can help reduce swelling and facial pressure, as can ice packs over the face.  A humidifier may be helpful to keep the mucous and blood from drying in the nose.  ° °Diet: There are no specific diet restrictions, however, you should generally start with clear liquids and a light diet of bland foods because the anesthetic can cause some nausea.  Advance your diet depending on how your stomach feels.  Taking your pain medication with food will often help reduce stomach upset which pain medications can cause. ° °Nasal Saline Irrigation: It is important to remove blood clots and dried mucous from the nose as it is healing.  This is done by having you irrigate the nose at least 3 - 4 times daily with a salt water solution.  We recommend using NeilMed Sinus Rinse (available at the drug store).  Fill the squeeze bottle with the solution, bend over a sink, and insert the tip of the squeeze bottle into the nose ½ of an inch.  Point the tip of the squeeze bottle towards the inside corner of the eye on the same side your irrigating.  Squeeze the bottle and gently irrigate the nose.  If you bend forward as you do this, most of the fluid will flow back out of the nose, instead of down your throat.   The solution should be warm, near body temperature, when you irrigate.   Each time you irrigate, you should use a full squeeze bottle.  ° °Note that if you are instructed to use Nasal Steroid Sprays at any time after your surgery, irrigate with saline BEFORE using the steroid spray, so you do not wash it all out of the nose. °Another product, Nasal Saline Gel (such as AYR Nasal Saline Gel) can be applied in each nostril 3 - 4 times daily to moisture the nose and reduce scabbing or crusting. ° °Bleeding:   Bloody drainage from the nose can be expected for several days, and patients are instructed to irrigate their nose frequently with salt water to help remove mucous and blood clots.  The drainage may be dark red or brown, though some fresh blood may be seen intermittently, especially after irrigation.  Do not blow you nose, as bleeding may occur. If you must sneeze, keep your mouth open to allow air to escape through your mouth. ° °If heavy bleeding occurs: Irrigate the nose with saline to rinse out clots, then spray the nose 3 - 4 times with Afrin Nasal Decongestant Spray.  The spray will constrict the blood vessels to slow bleeding.  Pinch the lower half of your nose shut to apply pressure, and lay down with your head elevated.  Ice packs over the nose may help as well. If bleeding persists despite these measures, you should notify your doctor.  Do not use the Afrin routinely to control nasal congestion after surgery, as it can result in worsening congestion and may affect healing.  ° ° ° °Activity: Return to work varies among patients. Most patients will be   out of work at least 5 - 7 days to recover.  Patient may return to work after they are off of narcotic pain medication, and feeling well enough to perform the functions of their job.  Patients must avoid heavy lifting (over 10 pounds) or strenuous physical for 2 weeks after surgery, so your employer may need to assign you to light duty, or keep you out of work longer if light duty is not possible.  NOTE: you should not drive, operate dangerous machinery, do any mentally demanding tasks or make any important legal or financial decisions while on narcotic pain medication and recovering from the general anesthetic.  °  °Call Your Doctor Immediately if You Have Any of the Following: °1. Bleeding that you cannot control with the above measures °2. Loss of vision, double vision, bulging of the eye or [place] eyes. °3. Fever over 101 degrees °4. Neck stiffness with  severe headache, fever, nausea and change in mental state. °You are always encourage to call anytime with concerns, however, please call with requests for pain medication refills during office hours. ° °Office Endoscopy: During follow-up visits your doctor will remove any packing or splints that may have been placed and evaluate and clean your sinuses endoscopically.  Topical anesthetic will be used to make this as comfortable as possible, though you may want to take your pain medication prior to the visit.  How often this will need to be done varies from patient to patient.  After complete recovery from the surgery, you may need follow-up endoscopy from time to time, particularly if there is concern of recurrent infection or nasal polyps. ° ° °General Anesthesia, Adult, Care After °This sheet gives you information about how to care for yourself after your procedure. Your health care provider may also give you more specific instructions. If you have problems or questions, contact your health care provider. °What can I expect after the procedure? °After the procedure, the following side effects are common: °· Pain or discomfort at the IV site. °· Nausea. °· Vomiting. °· Sore throat. °· Trouble concentrating. °· Feeling cold or chills. °· Weak or tired. °· Sleepiness and fatigue. °· Soreness and body aches. These side effects can affect parts of the body that were not involved in surgery. °Follow these instructions at home: ° °For at least 24 hours after the procedure: °· Have a responsible adult stay with you. It is important to have someone help care for you until you are awake and alert. °· Rest as needed. °· Do not: °? Participate in activities in which you could fall or become injured. °? Drive. °? Use heavy machinery. °? Drink alcohol. °? Take sleeping pills or medicines that cause drowsiness. °? Make important decisions or sign legal documents. °? Take care of children on your own. °Eating and  drinking °· Follow any instructions from your health care provider about eating or drinking restrictions. °· When you feel hungry, start by eating small amounts of foods that are soft and easy to digest (bland), such as toast. Gradually return to your regular diet. °· Drink enough fluid to keep your urine pale yellow. °· If you vomit, rehydrate by drinking water, juice, or clear broth. °General instructions °· If you have sleep apnea, surgery and certain medicines can increase your risk for breathing problems. Follow instructions from your health care provider about wearing your sleep device: °? Anytime you are sleeping, including during daytime naps. °? While taking prescription pain medicines, sleeping medicines, or medicines   that make you drowsy. °· Return to your normal activities as told by your health care provider. Ask your health care provider what activities are safe for you. °· Take over-the-counter and prescription medicines only as told by your health care provider. °· If you smoke, do not smoke without supervision. °· Keep all follow-up visits as told by your health care provider. This is important. °Contact a health care provider if: °· You have nausea or vomiting that does not get better with medicine. °· You cannot eat or drink without vomiting. °· You have pain that does not get better with medicine. °· You are unable to pass urine. °· You develop a skin rash. °· You have a fever. °· You have redness around your IV site that gets worse. °Get help right away if: °· You have difficulty breathing. °· You have chest pain. °· You have blood in your urine or stool, or you vomit blood. °Summary °· After the procedure, it is common to have a sore throat or nausea. It is also common to feel tired. °· Have a responsible adult stay with you for the first 24 hours after general anesthesia. It is important to have someone help care for you until you are awake and alert. °· When you feel hungry, start by eating  small amounts of foods that are soft and easy to digest (bland), such as toast. Gradually return to your regular diet. °· Drink enough fluid to keep your urine pale yellow. °· Return to your normal activities as told by your health care provider. Ask your health care provider what activities are safe for you. °This information is not intended to replace advice given to you by your health care provider. Make sure you discuss any questions you have with your health care provider. °Document Released: 07/10/2000 Document Revised: 04/06/2017 Document Reviewed: 11/17/2016 °Elsevier Patient Education © 2020 [employer] ° °

## 2019-02-21 ENCOUNTER — Other Ambulatory Visit: Payer: BC Managed Care – PPO

## 2019-02-24 ENCOUNTER — Other Ambulatory Visit
Admission: RE | Admit: 2019-02-24 | Discharge: 2019-02-24 | Disposition: A | Discharge status: Home / Self Care | Payer: Managed Care, Other (non HMO) | Source: Ambulatory Visit | Attending: Otolaryngology | Admitting: Otolaryngology

## 2019-02-24 ENCOUNTER — Other Ambulatory Visit: Payer: Self-pay

## 2019-02-24 ENCOUNTER — Encounter: Payer: BC Managed Care – PPO | Admitting: Family Medicine

## 2019-02-24 DIAGNOSIS — J329 Chronic sinusitis, unspecified: Secondary | ICD-10-CM | POA: Diagnosis not present

## 2019-02-24 DIAGNOSIS — J342 Deviated nasal septum: Secondary | ICD-10-CM | POA: Insufficient documentation

## 2019-02-24 DIAGNOSIS — Z01812 Encounter for preprocedural laboratory examination: Secondary | ICD-10-CM | POA: Insufficient documentation

## 2019-02-24 DIAGNOSIS — Z20828 Contact with and (suspected) exposure to other viral communicable diseases: Secondary | ICD-10-CM | POA: Diagnosis not present

## 2019-02-24 LAB — SARS CORONAVIRUS 2 (TAT 6-24 HRS): SARS Coronavirus 2: NEGATIVE

## 2019-02-27 ENCOUNTER — Encounter
Admission: RE | Disposition: A | Discharge status: Home / Self Care | Payer: Self-pay | Source: Home / Self Care | Attending: Otolaryngology

## 2019-02-27 ENCOUNTER — Ambulatory Visit
Admission: RE | Admit: 2019-02-27 | Discharge: 2019-02-27 | Disposition: A | Discharge status: Home / Self Care | Payer: Managed Care, Other (non HMO) | Attending: Otolaryngology | Admitting: Otolaryngology

## 2019-02-27 ENCOUNTER — Ambulatory Visit: Payer: Managed Care, Other (non HMO) | Admitting: Anesthesiology

## 2019-02-27 ENCOUNTER — Other Ambulatory Visit: Payer: Self-pay

## 2019-02-27 DIAGNOSIS — Z881 Allergy status to other antibiotic agents status: Secondary | ICD-10-CM | POA: Diagnosis not present

## 2019-02-27 DIAGNOSIS — Z79899 Other long term (current) drug therapy: Secondary | ICD-10-CM | POA: Diagnosis not present

## 2019-02-27 DIAGNOSIS — Z87891 Personal history of nicotine dependence: Secondary | ICD-10-CM | POA: Diagnosis not present

## 2019-02-27 DIAGNOSIS — J329 Chronic sinusitis, unspecified: Secondary | ICD-10-CM | POA: Insufficient documentation

## 2019-02-27 DIAGNOSIS — J342 Deviated nasal septum: Secondary | ICD-10-CM | POA: Insufficient documentation

## 2019-02-27 DIAGNOSIS — I10 Essential (primary) hypertension: Secondary | ICD-10-CM | POA: Diagnosis not present

## 2019-02-27 DIAGNOSIS — Z888 Allergy status to other drugs, medicaments and biological substances status: Secondary | ICD-10-CM | POA: Diagnosis not present

## 2019-02-27 DIAGNOSIS — J343 Hypertrophy of nasal turbinates: Secondary | ICD-10-CM | POA: Insufficient documentation

## 2019-02-27 DIAGNOSIS — Z6841 Body Mass Index (BMI) 40.0 and over, adult: Secondary | ICD-10-CM | POA: Insufficient documentation

## 2019-02-27 HISTORY — DX: Presence of external hearing-aid: Z97.4

## 2019-02-27 HISTORY — PX: MAXILLARY ANTROSTOMY: SHX2003

## 2019-02-27 HISTORY — PX: TURBINATE REDUCTION: SHX6157

## 2019-02-27 HISTORY — PX: IMAGE GUIDED SINUS SURGERY: SHX6570

## 2019-02-27 HISTORY — PX: SEPTOPLASTY: SHX2393

## 2019-02-27 HISTORY — PX: ETHMOIDECTOMY: SHX5197

## 2019-02-27 HISTORY — PX: FRONTAL SINUS EXPLORATION: SHX6591

## 2019-02-27 SURGERY — SEPTOPLASTY, NOSE
Anesthesia: General | Site: Nose | Laterality: Bilateral

## 2019-02-27 SURGERY — Surgical Case
Anesthesia: *Unknown

## 2019-02-27 MED ORDER — HYDROCODONE-ACETAMINOPHEN 5-325 MG PO TABS: 1.0000 | ORAL_TABLET | ORAL | 0 refills | Status: AC | PRN

## 2019-02-27 MED ORDER — LIDOCAINE HCL (CARDIAC) PF 100 MG/5ML IV SOSY
PREFILLED_SYRINGE | INTRAVENOUS | Status: DC | PRN
  Administered 2019-02-27: 50 mg via INTRAVENOUS

## 2019-02-27 MED ORDER — FENTANYL CITRATE (PF) 100 MCG/2ML IJ SOLN
INTRAMUSCULAR | Status: DC | PRN
  Administered 2019-02-27: 100 ug via INTRAVENOUS

## 2019-02-27 MED ORDER — PHENYLEPHRINE HCL 0.5 % NA SOLN
NASAL | Status: DC | PRN
  Administered 2019-02-27: 08:00:00 30 mL via TOPICAL

## 2019-02-27 MED ORDER — SUCCINYLCHOLINE CHLORIDE 20 MG/ML IJ SOLN
INTRAMUSCULAR | Status: DC | PRN
  Administered 2019-02-27: 100 mg via INTRAVENOUS

## 2019-02-27 MED ORDER — DEXAMETHASONE SODIUM PHOSPHATE 4 MG/ML IJ SOLN
INTRAMUSCULAR | Status: DC | PRN
  Administered 2019-02-27: 10 mg via INTRAVENOUS

## 2019-02-27 MED ORDER — ONDANSETRON HCL 4 MG/2ML IJ SOLN
INTRAMUSCULAR | Status: DC | PRN
  Administered 2019-02-27: 4 mg via INTRAVENOUS

## 2019-02-27 MED ORDER — PROPOFOL 10 MG/ML IV BOLUS
INTRAVENOUS | Status: DC | PRN
  Administered 2019-02-27: 150 mg via INTRAVENOUS

## 2019-02-27 MED ORDER — PREDNISONE 10 MG PO TABS: ORAL_TABLET | ORAL | 0 refills | Status: DC

## 2019-02-27 MED ORDER — LIDOCAINE-EPINEPHRINE 1 %-1:100000 IJ SOLN
INTRAMUSCULAR | Status: DC | PRN
  Administered 2019-02-27: 6 mL
  Administered 2019-02-27: 1.5 mL

## 2019-02-27 MED ORDER — ACETAMINOPHEN 10 MG/ML IV SOLN
1000.0000 mg | Freq: Once | INTRAVENOUS | Status: AC
  Administered 2019-02-27: 1000 mg via INTRAVENOUS

## 2019-02-27 MED ORDER — SODIUM CHLORIDE 0.9 % IV SOLN
INTRAVENOUS | Status: DC | PRN
  Administered 2019-02-27 (×2): 50 ug via INTRAVENOUS
  Administered 2019-02-27: 100 ug via INTRAVENOUS
  Administered 2019-02-27 (×6): 50 ug via INTRAVENOUS

## 2019-02-27 MED ORDER — OXYMETAZOLINE HCL 0.05 % NA SOLN
2.0000 | Freq: Once | NASAL | Status: AC
  Administered 2019-02-27: 2 via NASAL

## 2019-02-27 MED ORDER — LIDOCAINE HCL 4 % MT SOLN
OROMUCOSAL | Status: DC | PRN
  Administered 2019-02-27: 4 mL via TOPICAL

## 2019-02-27 MED ORDER — MIDAZOLAM HCL 5 MG/5ML IJ SOLN
INTRAMUSCULAR | Status: DC | PRN
  Administered 2019-02-27: 2 mg via INTRAVENOUS

## 2019-02-27 MED ORDER — EPHEDRINE SULFATE 50 MG/ML IJ SOLN
INTRAMUSCULAR | Status: DC | PRN
  Administered 2019-02-27 (×3): 5 mg via INTRAVENOUS
  Administered 2019-02-27 (×3): 10 mg via INTRAVENOUS
  Administered 2019-02-27: 5 mg via INTRAVENOUS

## 2019-02-27 MED ORDER — CEPHALEXIN 500 MG PO CAPS: 500.0000 mg | ORAL_CAPSULE | Freq: Two times a day (BID) | ORAL | 0 refills | Status: DC

## 2019-02-27 MED ORDER — LACTATED RINGERS IV SOLN
10.0000 mL/h | INTRAVENOUS | Status: DC
  Administered 2019-02-27: 07:00:00 10 mL/h via INTRAVENOUS
  Administered 2019-02-27: 10:00:00 via INTRAVENOUS

## 2019-02-27 MED ORDER — DEXTROSE 5 % IV SOLN
2000.0000 mg | Freq: Once | INTRAVENOUS | Status: AC
  Administered 2019-02-27: 2000 mg via INTRAVENOUS

## 2019-02-27 MED ORDER — GLYCOPYRROLATE 0.2 MG/ML IJ SOLN
INTRAMUSCULAR | Status: DC | PRN
  Administered 2019-02-27: 0.1 mg via INTRAVENOUS

## 2019-02-27 SURGICAL SUPPLY — 39 items
BATTERY INSTRU NAVIGATION (MISCELLANEOUS) ×12 IMPLANT
BTRY SRG DRVR LF (MISCELLANEOUS) ×6
CANISTER SUCT 1200ML W/VALVE (MISCELLANEOUS) ×4 IMPLANT
CATH IV 18X1 1/4 SAFELET (CATHETERS) ×4 IMPLANT
COAGULATOR SUCT 8FR VV (MISCELLANEOUS) ×4 IMPLANT
ELECT REM PT RETURN 9FT ADLT (ELECTROSURGICAL) ×4
ELECTRODE REM PT RTRN 9FT ADLT (ELECTROSURGICAL) ×2 IMPLANT
GLOVE PI ULTRA LF STRL 7.5 (GLOVE) ×4 IMPLANT
GLOVE PI ULTRA NON LATEX 7.5 (GLOVE) ×10
GOWN STRL REUS W/ TWL LRG LVL3 (GOWN DISPOSABLE) ×2 IMPLANT
GOWN STRL REUS W/TWL LRG LVL3 (GOWN DISPOSABLE) ×12
IV CATH 18X1 1/4 SAFELET (CATHETERS) ×2
IV NS 500ML (IV SOLUTION) ×4
IV NS 500ML BAXH (IV SOLUTION) ×2 IMPLANT
KIT TURNOVER KIT A (KITS) ×4 IMPLANT
NDL ANESTHESIA 27G X 3.5 (NEEDLE) ×2 IMPLANT
NDL HYPO 27GX1-1/4 (NEEDLE) ×2 IMPLANT
NEEDLE ANESTHESIA  27G X 3.5 (NEEDLE) ×2
NEEDLE ANESTHESIA 27G X 3.5 (NEEDLE) ×2 IMPLANT
NEEDLE HYPO 27GX1-1/4 (NEEDLE) ×4 IMPLANT
NS IRRIG 500ML POUR BTL (IV SOLUTION) ×4 IMPLANT
PACK ENT CUSTOM (PACKS) ×4 IMPLANT
PACKING NASAL EPIS 4X2.4 XEROG (MISCELLANEOUS) ×8 IMPLANT
PATTIES SURGICAL .5 X3 (DISPOSABLE) ×4 IMPLANT
SHAVER DIEGO BLD STD TYPE A (BLADE) ×2 IMPLANT
SOL ANTI-FOG 6CC FOG-OUT (MISCELLANEOUS) ×2 IMPLANT
SOL FOG-OUT ANTI-FOG 6CC (MISCELLANEOUS) ×2
SPLINT NASAL SEPTAL BLV .50 ST (MISCELLANEOUS) ×4 IMPLANT
STRAP BODY AND KNEE 60X3 (MISCELLANEOUS) ×4 IMPLANT
SUT CHROMIC 3-0 (SUTURE) ×4
SUT CHROMIC 3-0 KS 27XMFL CR (SUTURE) ×2
SUT ETHILON 3-0 KS 30 BLK (SUTURE) ×4 IMPLANT
SUT PLAIN GUT 4-0 (SUTURE) ×4 IMPLANT
SUTURE CHRMC 3-0 KS 27XMFL CR (SUTURE) IMPLANT
SYR 3ML LL SCALE MARK (SYRINGE) ×4 IMPLANT
TOWEL OR 17X26 4PK STRL BLUE (TOWEL DISPOSABLE) ×4 IMPLANT
TRACKER CRANIALMASK (MASK) ×4 IMPLANT
TUBING DECLOG MULTIDEBRIDER (TUBING) ×4 IMPLANT
WATER STERILE IRR 250ML POUR (IV SOLUTION) ×4 IMPLANT

## 2019-02-27 NOTE — Anesthesia Postprocedure Evaluation (Signed)
Anesthesia Post Note  Patient: Dustin [name]  Procedure(s) Performed: SEPTOPLASTY (Bilateral Nose) TURBINATE REDUCTION (Bilateral Nose) IMAGE GUIDED SINUS SURGERY (Bilateral ) MAXILLARY ANTROSTOMY WITH TISSUE REMOVAL (Bilateral Nose) ETHMOIDECTOMY (Bilateral Nose) FRONTAL SINUS EXPLORATION (Bilateral Nose)     Patient location during evaluation: PACU Anesthesia Type: General Level of consciousness: awake and alert and oriented Pain management: satisfactory to patient Vital Signs Assessment: post-procedure vital signs reviewed and stable Respiratory status: spontaneous breathing, nonlabored ventilation and respiratory function stable Cardiovascular status: blood pressure returned to baseline and stable Postop Assessment: Adequate PO intake and No signs of nausea or vomiting Anesthetic complications: no    Raliegh Ip

## 2019-02-27 NOTE — Anesthesia Procedure Notes (Signed)
Procedure Name: Intubation Date/Time: 02/27/2019 7:43 AM Performed by: Mayme Genta, CRNA Pre-anesthesia Checklist: Patient identified, Emergency Drugs available, Suction available, Patient being monitored and Timeout performed Patient Re-evaluated:Patient Re-evaluated prior to induction Oxygen Delivery Method: Circle system utilized Preoxygenation: Pre-oxygenation with 100% oxygen Induction Type: IV induction Ventilation: Mask ventilation without difficulty Laryngoscope Size: Miller and 3 Grade View: Grade I Tube type: Oral Rae Tube size: 7.5 mm Number of attempts: 1 Placement Confirmation: ETT inserted through vocal cords under direct vision,  positive ETCO2 and breath sounds checked- equal and bilateral Tube secured with: Tape Dental Injury: Teeth and Oropharynx as per pre-operative assessment

## 2019-02-27 NOTE — Transfer of Care (Signed)
Immediate Anesthesia Transfer of Care Note  Patient: Dustin [name]  Procedure(s) Performed: SEPTOPLASTY (Bilateral Nose) TURBINATE REDUCTION (Bilateral Nose) IMAGE GUIDED SINUS SURGERY (Bilateral ) MAXILLARY ANTROSTOMY WITH TISSUE REMOVAL (Bilateral Nose) ETHMOIDECTOMY (Bilateral Nose) FRONTAL SINUS EXPLORATION (Bilateral Nose)  Patient Location: PACU  Anesthesia Type: General ETT  Level of Consciousness: awake, alert  and patient cooperative  Airway and Oxygen Therapy: Patient Spontanous Breathing and Patient connected to supplemental oxygen  Post-op Assessment: Post-op Vital signs reviewed, Patient's Cardiovascular Status Stable, Respiratory Function Stable, Patent Airway and No signs of Nausea or vomiting  Post-op Vital Signs: Reviewed and stable  Complications: No apparent anesthesia complications

## 2019-02-27 NOTE — Op Note (Signed)
02/27/2019  10:08 AM  OC:[id number]   Pre-Op Dx:  Deviated Nasal Septum, Hypertrophic Inferior Turbinates, bilateral chronic sinusitis involving the maxillary sinuses, frontal sinuses, and ethmoid sinuses.  Post-op Dx: Same  Proc: Nasal Septoplasty, Bilateral Partial Reduction Inferior Turbinates, bilateral endoscopic total ethmoidectomy with frontal sinusotomies, bilateral maxillary antrostomies, use of image guided system  Surg:  Elon Alas Hoby Kawai  Anes:  GOT  EBL: 100 mL  Comp: None  Findings: The superior septum was deviated to the right side and the right middle turbinate was lateralized and obstructing the entire middle meatus.  Inferiorly there was a large maxillary crest spur to the left side blocking the inferior airway on the left.  The right inferior turbinate was the largest and needed the most work done to it.  Procedure: With the patient in a comfortable supine position,  general orotracheal anesthesia was induced without difficulty.     The patient received preoperative Afrin spray for topical decongestion and vasoconstriction.  Intravenous prophylactic antibiotics were administered.  The image guided system was brought in and the CT scan was downloaded from the disc.  The template was applied the face and this was registered to the system.  There is 0.5 mm of variance.  The suction instruments were then registered to the system as well and were compared to the anatomy.  There appeared to be perfect alignment with the image guided system.  At an appropriate level, the patient was placed in a semi-sitting position.  Nasal vibrissae were trimmed.   1% Xylocaine with 1:100,000 epinephrine, 5 cc's, was infiltrated into the anterior floor of the nose, into the nasal spine region, into the membranous columella, and finally into the submucoperichondrial plane of the septum on both sides.  Several minutes were allowed for this to take effect.  Cottoniod pledgetts soaked in Afrin and 4%  Xylocaine were placed into both nasal cavities and left while the patient was prepped and draped in the standard fashion.  The materials were removed from the nose and observed to be intact and correct in number.  The nose was inspected with a headlight and zero degree scope with the findings as described above.  A left Killian incision was sharply executed and carried down to the quadrangular cartilage. The mucoperichondrium was elelvated along the quadrangular plate back to the bony-cartilaginous junction. The mucoperiostium was then elevated along the ethmoid plate and the vomer. The boney-catilaginous junction was then split with a freer elevator and the mucoperiosteum was elevated on the opposite side. The mucoperiosteum was then elevated along the maxillary crest as needed to expose the crooked bone of the crest.  Boney spurs of the vomer and maxillary crest were removed with Donavan Foil forceps.  A chisel was used to remove some of the bony crest to the left side.  The cartilaginous plate was trimmed along its posterior and inferior borders of about 2 mm of cartilage to free it up inferiorly. Some of the deviated ethmoid plate was then fractured and removed with Takahashi forceps to free up the posterior border of the quadrangular plate and allow it to swing back to the midline. The mucosal flaps were placed back into their anatomic position to allow visualization of the airways. The septum now sat in the midline with an improved airway.  A 3-0 Chromic suture on a Keith needle in used to anchor the inferior septum at the nasal spine with a through and through suture. The mucosal flaps are then sutured together using a through and  through whip stitch of 4-0 Plain Gut with a mini-Keith needle. This was used to close the Rodman incision as well.   The inferior turbinates were then inspected. An incision was created along the inferior aspect of the left inferior turbinate with removal of some of the  inferior soft tissue and bone. Electrocautery was used to control bleeding in the area. The remaining turbinate was then outfractured to open up the airway further. There was no significant bleeding noted. The right turbinate was then trimmed and outfractured in a similar fashion.  More work was done to the right inferior turbinate because of its increased size.  The 0 degree scope was used in the left side to visualize the middle turbinate.  This was infractured to allow better visualization of middle meatus.  The uncinate process was incised and then was removed from its entire length overlying the maxillary antrum all the way to the anterior border of the middle turbinate.  The maxillary antrum was then widened using through biting forceps and the microdebrider.  The image guided system was used to visualize the anatomy.  The 30 degrees scope was used to visualize sinus and this opening was now widened and there is no significant disease at the sinus base.  The 0 degree scope was then used and the posterior ethmoid sinuses were open.  The image guided system was used to follow the depth resection and make sure that the sinuses were all opened posteriorly.  The fovea ethmoidalis was cleaned off of the bone chips to follow this anteriorly into the middle ethmoid air cells.  These were clean and open to allow drainage inferiorly.  The 30 degree scope was then used to visualize the anterior ethmoid air cells and the frontal sinus suction was used for the image guided system.  The agar nasi cell was opened first and then the opening of the frontal sinus duct was found along its posterior medial wall and this was widened.  This opened up all the anterior ethmoid air cells in the frontal sinus duct.  There is small amount of ooze here so cottonoid pledget was placed into the sinuses on the left side while the right side was addressed.  0 degree scope was used to visualize the right middle meatus.  The middle  turbinate was very thin and lateralized so that the middle meatus was completely obstructed.  When I medialized the middle turbinate the middle meatus was filled with clear thick mucus.  This was suctioned clear.  The uncinate process was incised and was removed.  The uncinate process showed almost no bone but just a couple of thicknesses of mucous membrane folded on 8 cell.  Once this was removed the maxillary antrum was seen and the opening was widened posteriorly and inferiorly as the other side using through biting forceps and the microdebrider.  The 30 degree scope was used to visualize sinus and it was clear throughout.  The image guided system was used to carefully evaluate the middle ethmoid air cells as the anterior portion had no air in it at all on the x-ray, and was difficult to see the extent of the sinuses.  There is a thin layer of bone and small narrow ethmoids that were open with just thickened mucous membranes filling these ethmoid air cells.  These were all cleaned out and the lamina papyracea was not broken through.  The posterior ethmoid air cells were opened and these had more air through parts of them.  The image guided system was used to evaluate the depth of dissection and the lateral walls.  The 30 degree scope was used to help open up the middle ethmoid air cells and then the anterior ethmoid air cells.  There is a very small frontal sinus on the side and this was opened up to make sure there is good drainage from here into the ethmoid and nose.  The mucous membranes were thickened in this area but there is no purulence found.  The 30 and 70 degree scope were used to visualize the frontal sinus area and make sure the sinuses were all opened, using the image guided system.  Once all the air cells were cleaned out then a cottonoid pledget was placed here temporarily for vasoconstriction.    The left side was revisited and the frontal sinus duct was seen with the 30 and 70 degrees scopes.   The ethmoids were completely opened and the maxillary antrum was opened as well.  Xerogel was then placed into the left ethmoid area and filling up the middle meatus.  None was placed into the airway further down.  This was all wet and coating the surface well.  The right side was then revisualized again with the 30 and 70 degree scope.  Frontal sinus duct was clear and the ethmoids were open.  The maxillary antrum was opened and clear and blood was suctioned from the maxillary sinus.  Xerogel was then placed into the ethmoid to fill the middle meatus again this is holding the middle turbinate medially like the other side, and is helped prevent clots in these areas.  The lower airway is open.  The airways were then visualized and showed open passageways on both sides that were significantly improved compared to before surgery. There was no signifcant bleeding. Nasal splints were applied to both sides of the septum using Xomed 0.65mm regular sized splints that were trimmed, and then held in position with a 3-0 Nylon through and through suture.  The patient was turned back over to anesthesia, and awakened, extubated, and taken to the PACU in satisfactory condition.  Dispo:   PACU to home  Plan: Ice, elevation, narcotic analgesia, steroid taper, and prophylactic antibiotics for the duration of indwelling nasal foreign bodies.  We will reevaluate the patient in the office in 5-6 days and remove the septal splints.  Return to work in 10 days, strenuous activities in two weeks.   Elon Alas Stephenie Navejas 02/27/2019 10:08 AM

## 2019-02-27 NOTE — H&P (Signed)
H&P has been reviewed and patient reevaluated, no changes necessary. To be downloaded later.  

## 2019-02-27 NOTE — Anesthesia Preprocedure Evaluation (Signed)
Anesthesia Evaluation  Patient identified by MRN, date of birth, ID band Patient awake    Reviewed: Allergy & Precautions, H&P , NPO status , Patient's Chart, lab work & pertinent test results  Airway Mallampati: II  TM Distance: >3 FB Neck ROM: full    Dental no notable dental hx.    Pulmonary former smoker,    Pulmonary exam normal breath sounds clear to auscultation       Cardiovascular hypertension, Normal cardiovascular exam Rhythm:regular Rate:Normal     Neuro/Psych    GI/Hepatic   Endo/Other  Morbid obesity  Renal/GU      Musculoskeletal   Abdominal   Peds  Hematology   Anesthesia Other Findings   Reproductive/Obstetrics                             Anesthesia Physical Anesthesia Plan  ASA: II  Anesthesia Plan: General ETT   Post-op Pain Management:    Induction:   PONV Risk Score and Plan: 2 and Ondansetron, Dexamethasone, Midazolam and Treatment may vary due to age or medical condition  Airway Management Planned:   Additional Equipment:   Intra-op Plan:   Post-operative Plan:   Informed Consent: I have reviewed the patients History and Physical, chart, labs and discussed the procedure including the risks, benefits and alternatives for the proposed anesthesia with the patient or authorized representative who has indicated his/her understanding and acceptance.       Plan Discussed with: CRNA  Anesthesia Plan Comments:         Anesthesia Quick Evaluation

## 2019-02-28 ENCOUNTER — Encounter: Payer: Self-pay | Admitting: Otolaryngology

## 2019-03-01 ENCOUNTER — Other Ambulatory Visit: Payer: Self-pay | Admitting: Family Medicine

## 2019-03-03 LAB — SURGICAL PATHOLOGY

## 2019-03-21 ENCOUNTER — Other Ambulatory Visit (INDEPENDENT_AMBULATORY_CARE_PROVIDER_SITE_OTHER): Payer: Managed Care, Other (non HMO)

## 2019-03-21 ENCOUNTER — Other Ambulatory Visit: Payer: Self-pay

## 2019-03-21 DIAGNOSIS — Z125 Encounter for screening for malignant neoplasm of prostate: Secondary | ICD-10-CM

## 2019-03-21 DIAGNOSIS — I10 Essential (primary) hypertension: Secondary | ICD-10-CM

## 2019-03-21 LAB — COMPREHENSIVE METABOLIC PANEL
ALT: 18 U/L (ref 0–53)
AST: 13 U/L (ref 0–37)
Albumin: 4.2 g/dL (ref 3.5–5.2)
Alkaline Phosphatase: 72 U/L (ref 39–117)
BUN: 21 mg/dL (ref 6–23)
CO2: 31 mEq/L (ref 19–32)
Calcium: 9.6 mg/dL (ref 8.4–10.5)
Chloride: 99 mEq/L (ref 96–112)
Creatinine, Ser: 0.95 mg/dL (ref 0.40–1.50)
GFR: 79.54 mL/min (ref >60.00)
Glucose, Bld: 105 mg/dL — ABNORMAL HIGH (ref 70–99)
Potassium: 4.4 mEq/L (ref 3.5–5.1)
Sodium: 137 mEq/L (ref 135–145)
Total Bilirubin: 0.5 mg/dL (ref 0.2–1.2)
Total Protein: 6.7 g/dL (ref 6.0–8.3)

## 2019-03-21 LAB — LIPID PANEL
Cholesterol: 168 mg/dL (ref 0–200)
HDL: 43.8 mg/dL (ref >39.00)
LDL Cholesterol: 92 mg/dL (ref 0–99)
NonHDL: 124.29
Total CHOL/HDL Ratio: 4
Triglycerides: 159 mg/dL — ABNORMAL HIGH (ref 0.0–149.0)
VLDL: 31.8 mg/dL (ref 0.0–40.0)

## 2019-03-21 LAB — PSA: PSA: 0.66 ng/mL (ref 0.10–4.00)

## 2019-03-24 ENCOUNTER — Encounter: Payer: Self-pay | Admitting: Family Medicine

## 2019-03-24 ENCOUNTER — Ambulatory Visit (INDEPENDENT_AMBULATORY_CARE_PROVIDER_SITE_OTHER): Payer: Managed Care, Other (non HMO) | Admitting: Family Medicine

## 2019-03-24 ENCOUNTER — Other Ambulatory Visit: Payer: Self-pay

## 2019-03-24 VITALS — BP 130/88 | HR 76 | Temp 97.6°F | Ht 68.0 in | Wt 279.3 lb

## 2019-03-24 DIAGNOSIS — Z0000 Encounter for general adult medical examination without abnormal findings: Secondary | ICD-10-CM | POA: Diagnosis not present

## 2019-03-24 DIAGNOSIS — Z7189 Other specified counseling: Secondary | ICD-10-CM

## 2019-03-24 DIAGNOSIS — I10 Essential (primary) hypertension: Secondary | ICD-10-CM

## 2019-03-24 MED ORDER — HYDROCHLOROTHIAZIDE 12.5 MG PO TABS: 12.5000 mg | ORAL_TABLET | Freq: Every day | ORAL | Status: DC

## 2019-03-24 NOTE — Patient Instructions (Addendum)
Keep working on diet and exercise.  I'll await the notes from ENT.   Take an extra dose of HCTZ if needed for swelling.  Call me if you need help getting an appointment for the colonoscopy in the spring of 2021.  We can talk about AAA screening after covid.   Take care.  Glad to see you.

## 2019-03-24 NOTE — Progress Notes (Signed)
This visit occurred during the SARS-CoV-2 public health emergency.  Safety protocols were in place, including screening questions prior to the visit, additional usage of staff PPE, and extensive cleaning of exam room while observing appropriate contact time as indicated for disinfecting solutions.   CPE- See plan.  Routine anticipatory guidance given to patient.  See health maintenance.  The possibility exists that previously documented standard health maintenance information may have been brought forward from a previous encounter into this note.  If needed, that same information has been updated to reflect the current situation based on today's encounter.    Tetanus 2013 Flu 2020 PNA deferred given current abx use.   Shingles prev done.   HIV and HCV neg prev.   PSA wnl. Dw pt. FH noted.  Colonoscopy due.  D/w pt.  Defer given pandemic.   Living will d/w pt. Wife designated if patient were incapacitated.  Diet and exercise d/w pt. Encouraged both, discussed.  AAA screening d/w pt.  Defer given pandemic.  He agrees.   Hypertension:    Using medication without problems or lightheadedness: yes Chest pain with exertion:no Edema:some BLE edema.  occ needs extra dose of HCTZ, with relief.   Short of breath:no  He had sinus surgery.  He has ENT f/u pending.  He had some tooth pain and is currently on abx.   No fevers.  I will defer otherwise.  He agrees.  PMH and SH reviewed  Meds, vitals, and allergies reviewed.   ROS: Per HPI.  Unless specifically indicated otherwise in HPI, the patient denies:  General: fever. Eyes: acute vision changes ENT: sore throat Cardiovascular: chest pain Respiratory: SOB GI: vomiting GU: dysuria Musculoskeletal: acute back pain Derm: acute rash Neuro: acute motor dysfunction Psych: worsening mood Endocrine: polydipsia Heme: bleeding Allergy: hayfever  GEN: nad, alert and oriented HEENT: ncat NECK: supple w/o LA CV: rrr. PULM: ctab, no inc  wob ABD: soft, +bs EXT: no edema SKIN: no acute rash

## 2019-03-27 NOTE — Assessment & Plan Note (Signed)
Living will d/w pt.  Wife designated if patient were incapacitated.   ?

## 2019-03-27 NOTE — Assessment & Plan Note (Signed)
No change in meds.  Continue work on diet and exercise.  Labs discussed.  He agrees.

## 2019-03-27 NOTE — Assessment & Plan Note (Signed)
Tetanus 2013 Flu 2020 PNA deferred given current abx use.   Shingles prev done.   HIV and HCV neg prev.   PSA wnl. Dw pt. FH noted.  Colonoscopy due.  D/w pt.  Defer given pandemic.   Living will d/w pt. Wife designated if patient were incapacitated.  Diet and exercise d/w pt. Encouraged both, discussed.  AAA screening d/w pt.  Defer given pandemic.  He agrees.

## 2019-06-06 ENCOUNTER — Encounter: Payer: Self-pay | Admitting: Family Medicine

## 2019-06-06 ENCOUNTER — Other Ambulatory Visit: Payer: Self-pay

## 2019-06-06 ENCOUNTER — Ambulatory Visit (INDEPENDENT_AMBULATORY_CARE_PROVIDER_SITE_OTHER): Payer: Managed Care, Other (non HMO) | Admitting: Family Medicine

## 2019-06-06 DIAGNOSIS — J019 Acute sinusitis, unspecified: Secondary | ICD-10-CM

## 2019-06-06 DIAGNOSIS — I10 Essential (primary) hypertension: Secondary | ICD-10-CM

## 2019-06-06 MED ORDER — AMOXICILLIN-POT CLAVULANATE 875-125 MG PO TABS: 1.0000 | ORAL_TABLET | Freq: Two times a day (BID) | ORAL | 0 refills | Status: DC

## 2019-06-06 NOTE — Progress Notes (Signed)
Virtual visit completed through WebEx or similar program Patient location: home  Provider location: [facility] at [facility], office   Pandemic considerations d/w pt.   Limitations and rationale for visit method d/w patient.  Patient agreed to proceed.   CC: URI.    HPI:   BP elevation d/w pt.  He hasn't checked much recently and d/w pt about checking more often.  He'll update me if persistently >140/>90 when he feels well.    He had ENT f/u.  He had sinus surgery per ENT in fall 2020.  Treated with levaquin in 03/2019 for URI.  Then 3-4 weeks ago, he had rhinorrhea/congetion.  He has been using nasal rinse.  No fevers.  No SOB, CP.  Still with R ear pain and HA.  Taking tylenol and ibuprofen in the meantime for HA.  Some maxillary pain, upper tooth pain.  Minimal cough.  No covid exposures.    He is going to get covid vaccine next week of possible.  We talked about pushing that back by about 1 week, until he feels better.    Meds and allergies reviewed.   ROS: Per HPI unless specifically indicated in ROS section   NAD Speech wnl  A/P:  Presumed sinusitis, still okay for outpatient follow-up.  Start augmentin.  Routine cautions d/w pt.  He agrees.    BP elevation d/w pt.  He hasn't checked much recently and d/w pt about checking more often.  He'll update me if persistently >140/>90 when he feels well.

## 2019-06-08 DIAGNOSIS — J0100 Acute maxillary sinusitis, unspecified: Secondary | ICD-10-CM | POA: Insufficient documentation

## 2019-06-08 DIAGNOSIS — J019 Acute sinusitis, unspecified: Secondary | ICD-10-CM | POA: Insufficient documentation

## 2019-06-08 NOTE — Assessment & Plan Note (Signed)
Presumed sinusitis, still okay for outpatient follow-up.  Start augmentin.  Routine cautions d/w pt.  He agrees.

## 2019-06-08 NOTE — Assessment & Plan Note (Signed)
BP elevation d/w pt.  He hasn't checked much recently and d/w pt about checking more often.  He'll update me if persistently >140/>90 when he feels well.

## 2019-06-19 ENCOUNTER — Ambulatory Visit: Payer: Managed Care, Other (non HMO)

## 2019-06-29 ENCOUNTER — Other Ambulatory Visit: Payer: Self-pay | Admitting: Family Medicine

## 2019-06-29 ENCOUNTER — Encounter: Payer: Self-pay | Admitting: Family Medicine

## 2019-06-30 MED ORDER — HYDROCHLOROTHIAZIDE 12.5 MG PO TABS: 12.5000 mg | ORAL_TABLET | Freq: Every day | ORAL | 1 refills | Status: DC

## 2019-09-23 ENCOUNTER — Ambulatory Visit (INDEPENDENT_AMBULATORY_CARE_PROVIDER_SITE_OTHER): Payer: Medicare Other | Admitting: Family Medicine

## 2019-09-23 ENCOUNTER — Other Ambulatory Visit: Payer: Self-pay

## 2019-09-23 ENCOUNTER — Encounter: Payer: Self-pay | Admitting: Family Medicine

## 2019-09-23 VITALS — BP 142/84 | HR 74 | Temp 97.5°F | Ht 68.0 in | Wt 286.1 lb

## 2019-09-23 DIAGNOSIS — R202 Paresthesia of skin: Secondary | ICD-10-CM | POA: Diagnosis not present

## 2019-09-23 LAB — COMPREHENSIVE METABOLIC PANEL
ALT: 24 U/L (ref 0–53)
AST: 19 U/L (ref 0–37)
Albumin: 4.5 g/dL (ref 3.5–5.2)
Alkaline Phosphatase: 69 U/L (ref 39–117)
BUN: 18 mg/dL (ref 6–23)
CO2: 30 mEq/L (ref 19–32)
Calcium: 9.5 mg/dL (ref 8.4–10.5)
Chloride: 101 mEq/L (ref 96–112)
Creatinine, Ser: 0.88 mg/dL (ref 0.40–1.50)
GFR: 86.75 mL/min (ref >60.00)
Glucose, Bld: 98 mg/dL (ref 70–99)
Potassium: 4.1 mEq/L (ref 3.5–5.1)
Sodium: 138 mEq/L (ref 135–145)
Total Bilirubin: 0.4 mg/dL (ref 0.2–1.2)
Total Protein: 6.9 g/dL (ref 6.0–8.3)

## 2019-09-23 LAB — TSH: TSH: 1.16 u[IU]/mL (ref 0.35–4.50)

## 2019-09-23 LAB — CBC WITH DIFFERENTIAL/PLATELET
Basophils Absolute: 0 10*3/uL (ref 0.0–0.1)
Basophils Relative: 0.3 % (ref 0.0–3.0)
Eosinophils Absolute: 0.1 10*3/uL (ref 0.0–0.7)
Eosinophils Relative: 1.8 % (ref 0.0–5.0)
HCT: 43 % (ref 39.0–52.0)
Hemoglobin: 14.3 g/dL (ref 13.0–17.0)
Lymphocytes Relative: 22.8 % (ref 12.0–46.0)
Lymphs Abs: 1.4 10*3/uL (ref 0.7–4.0)
MCHC: 33.3 g/dL (ref 30.0–36.0)
MCV: 92.3 fl (ref 78.0–100.0)
Monocytes Absolute: 0.5 10*3/uL (ref 0.1–1.0)
Monocytes Relative: 7.8 % (ref 3.0–12.0)
Neutro Abs: 4.2 10*3/uL (ref 1.4–7.7)
Neutrophils Relative %: 67.3 % (ref 43.0–77.0)
Platelets: 245 10*3/uL (ref 150.0–400.0)
RBC: 4.66 Mil/uL (ref 4.22–5.81)
RDW: 13.7 % (ref 11.5–15.5)
WBC: 6.2 10*3/uL (ref 4.0–10.5)

## 2019-09-23 LAB — VITAMIN B12: Vitamin B-12: 290 pg/mL (ref 211–911)

## 2019-09-23 NOTE — Patient Instructions (Signed)
I would use soft shoe inserts in the meantime.  If you have more pain then let me know.  Go to the lab on the way out.   If you have mychart we'll likely use that to update you.   Take care.  Glad to see you.

## 2019-09-23 NOTE — Progress Notes (Signed)
This visit occurred during the SARS-CoV-2 public health emergency.  Safety protocols were in place, including screening questions prior to the visit, additional usage of staff PPE, and extensive cleaning of exam room while observing appropriate contact time as indicated for disinfecting solutions.  R 3rd and 4th toe pain.  Noted two days ago.  Episodic shooting pain.  Sleep disrupted.  Can have pain when not weight bearing. Didn't have more pain with weight bearing.  Some better today.    No trigger, no trauma, didn't stump his toe.  No bruising.  He thought it was slightly red at the 3rd and 4th MTP but that is resolved.  No known h/o gout but on HCTZ.  "It felt like somebody hit my funny bone" but in the toe.    Meds, vitals, and allergies reviewed.   ROS: Per HPI unless specifically indicated in ROS section   nad ncat rrr ctab Similar and intact monofilament testing x4 but dec vibration sensation on the B feet- intact on the BUE.  He had vibration sensation on the B knees.   CN2-12 wnl.  Normal radial and DP pulses B. 6 Skin well perfused without rash.

## 2019-09-24 ENCOUNTER — Other Ambulatory Visit: Payer: Self-pay | Admitting: Family Medicine

## 2019-09-24 DIAGNOSIS — R202 Paresthesia of skin: Secondary | ICD-10-CM

## 2019-09-24 NOTE — Assessment & Plan Note (Signed)
He has symmetric decreased sensation to vibration on the lower extremities with an otherwise unremarkable neurologic exam.  No focal weakness.  No new emergent symptoms that would require emergency room evaluation at this point.  I question if he has altered sensation and that led to a gait abnormality/change in weightbearing in the foot that irritated a digital nerve causing his right third and fourth toe pain.  Reasonable to work-up his paresthesia in the meantime.  Discussed with patient about options and differential diagnosis.  No signs of an ominous problem at this point but needs work-up.  He agrees with plan.  See notes on labs.

## 2019-09-25 ENCOUNTER — Encounter: Payer: Self-pay | Admitting: Neurology

## 2019-09-25 ENCOUNTER — Encounter: Payer: Self-pay | Admitting: Family Medicine

## 2019-10-08 ENCOUNTER — Encounter: Payer: Self-pay | Admitting: Family Medicine

## 2019-10-10 ENCOUNTER — Other Ambulatory Visit: Payer: Self-pay | Admitting: Family Medicine

## 2019-10-10 ENCOUNTER — Encounter: Payer: Self-pay | Admitting: Family Medicine

## 2019-10-10 DIAGNOSIS — Z136 Encounter for screening for cardiovascular disorders: Secondary | ICD-10-CM

## 2019-10-10 DIAGNOSIS — Z1211 Encounter for screening for malignant neoplasm of colon: Secondary | ICD-10-CM

## 2019-10-21 ENCOUNTER — Other Ambulatory Visit: Payer: Self-pay

## 2019-10-21 ENCOUNTER — Ambulatory Visit (INDEPENDENT_AMBULATORY_CARE_PROVIDER_SITE_OTHER): Payer: Managed Care, Other (non HMO) | Admitting: Family Medicine

## 2019-10-21 ENCOUNTER — Encounter: Payer: Self-pay | Admitting: Family Medicine

## 2019-10-21 VITALS — BP 152/92 | HR 70 | Temp 97.0°F | Ht 68.0 in | Wt 283.6 lb

## 2019-10-21 DIAGNOSIS — R351 Nocturia: Secondary | ICD-10-CM | POA: Diagnosis not present

## 2019-10-21 LAB — POC URINALSYSI DIPSTICK (AUTOMATED)
Bilirubin, UA: NEGATIVE
Blood, UA: NEGATIVE
Glucose, UA: NEGATIVE
Ketones, UA: NEGATIVE
Leukocytes, UA: NEGATIVE
Nitrite, UA: NEGATIVE
Protein, UA: NEGATIVE
Spec Grav, UA: 1.015 (ref 1.010–1.025)
Urobilinogen, UA: 0.2 E.U./dL
pH, UA: 6 (ref 5.0–8.0)

## 2019-10-21 MED ORDER — TAMSULOSIN HCL 0.4 MG PO CAPS: 0.4000 mg | ORAL_CAPSULE | Freq: Every day | ORAL | 3 refills | Status: DC

## 2019-10-21 NOTE — Progress Notes (Signed)
This visit occurred during the SARS-CoV-2 public health emergency.  Safety protocols were in place, including screening questions prior to the visit, additional usage of staff PPE, and extensive cleaning of exam room while observing appropriate contact time as indicated for disinfecting solutions.  Urinary sx.  On HCTZ at baseline.  Nocturia, about every 1.5-2 hours at night.  No burning with urination.  No fevers, no chills.  Better with less caffeine but still a problem.    We talked about his neuro sx- it is likely reasonable to keep scheduled as long as he doesn't have new sx- he doesn't have new sx.  If progressive or new sx, then I want him to update me.  He agrees.    He had cardiology f/u pending.    GEN: nad, alert and oriented HEENT: ncat NECK: supple w/o LA CV: rrr. PULM: ctab, no inc wob ABD: soft, +bs EXT: no edema SKIN: no acute rash

## 2019-10-21 NOTE — Patient Instructions (Signed)
Urine sample today. We'll update you about that.   Start flomax.  See if you urinate better.  When sleeping better, then try tapering caffeine.   Update me as needed.  Take care.  Glad to see you.

## 2019-10-24 NOTE — Assessment & Plan Note (Signed)
PSA normal approximately 7 months ago.  No need to repeat at this point.  Discussed.  He agrees.  Start Flomax.  Routine cautions given to patient.  He can try to taper caffeine when he is sleeping better.  He will update [place] as needed.  He agrees with plan.

## 2019-11-10 ENCOUNTER — Other Ambulatory Visit: Payer: Self-pay

## 2019-11-10 ENCOUNTER — Ambulatory Visit (INDEPENDENT_AMBULATORY_CARE_PROVIDER_SITE_OTHER): Payer: Managed Care, Other (non HMO)

## 2019-11-10 DIAGNOSIS — Z136 Encounter for screening for cardiovascular disorders: Secondary | ICD-10-CM | POA: Diagnosis not present

## 2019-11-13 ENCOUNTER — Telehealth: Payer: Self-pay

## 2019-11-13 NOTE — Telephone Encounter (Signed)
Nasal congestion with green mucous, cough, headache for 3 days. Uses Flonase daily. Fully vaccinated.   I advised him he would need a VV or go to an UC in the area.   Wants an antibiotic called in like it was last time to [facility]. States he can do a VV if needed. Please advise at [phone].

## 2019-11-13 NOTE — Telephone Encounter (Signed)
Per Dr Damita Dunnings, made him a VV tomorrow.

## 2019-11-14 ENCOUNTER — Encounter: Payer: Self-pay | Admitting: Family Medicine

## 2019-11-14 ENCOUNTER — Other Ambulatory Visit: Payer: Self-pay

## 2019-11-14 ENCOUNTER — Telehealth (INDEPENDENT_AMBULATORY_CARE_PROVIDER_SITE_OTHER): Payer: Managed Care, Other (non HMO) | Admitting: Family Medicine

## 2019-11-14 VITALS — Ht 68.0 in | Wt 283.6 lb

## 2019-11-14 DIAGNOSIS — J069 Acute upper respiratory infection, unspecified: Secondary | ICD-10-CM

## 2019-11-14 MED ORDER — AMOXICILLIN-POT CLAVULANATE 875-125 MG PO TABS: 1.0000 | ORAL_TABLET | Freq: Two times a day (BID) | ORAL | 0 refills | Status: DC

## 2019-11-14 NOTE — Progress Notes (Signed)
Virtual visit completed through WebEx or similar program Patient location: home  Provider location: [place] at [facility], office  Participants: Patient and me (unless stated otherwise below)  Pandemic considerations d/w pt.   Limitations and rationale for visit method d/w patient.  Patient agreed to proceed.   CC: URI sx.    HPI:  Discolored rhinorrhea with cough and congestion, fatigue. No sputum.  Cough comes in fits.  No fevers.  Temp 97.9.  BP 149/94.  No vomiting, no diarrhea.  No known covid exposures.  He has been careful about exposure, ie limited contact.  He has been vaccinated.  Citrus going on for about 1 week.  R maxillary pain.  Some tooth pain. Taking tylenol and ibuprofen.  No loss of taste or smell.    Still on flonase at baseline.    Meds and allergies reviewed.   ROS: Per HPI unless specifically indicated in ROS section   NAD Speech wnl  A/P:  It may make sense to f/u with ENT when he is feeling better given that he has had recurrent symptoms.  He'll call about that.   Presumed sinusitis.  Not likely to be covid given his situation.   Start augmentin, continue flonase.  Rest and fluids.   Update me as needed.  He agrees.    He'll f/u with neuro as planned.

## 2019-11-14 NOTE — Telephone Encounter (Signed)
Noted. Thanks.

## 2019-11-16 NOTE — Assessment & Plan Note (Addendum)
It may make sense to f/u with ENT when he is feeling better given that he has had recurrent symptoms.  He'll call about that.   Presumed sinusitis.  Not likely to be covid given his situation.   Start augmentin, continue flonase.  Rest and fluids.   Update me as needed.  He agrees.    He'll f/u with neuro as planned, discussed with patient.  No new neurologic symptoms in the meantime.

## 2019-11-17 ENCOUNTER — Encounter: Payer: Self-pay | Admitting: Gastroenterology

## 2019-11-20 ENCOUNTER — Telehealth: Payer: Self-pay

## 2019-11-20 NOTE — Telephone Encounter (Signed)
Pt called stating that he took new medication Flomax, he developed a headache after taking so he did not take medication again, headache resolved. Yesterday, he took the medication again and developed headache... pt wants to know if there is another medication that he can take in place of Flomax...Marland Kitchen please advise

## 2019-11-21 ENCOUNTER — Other Ambulatory Visit: Payer: Self-pay | Admitting: Family Medicine

## 2019-11-21 ENCOUNTER — Encounter: Payer: Self-pay | Admitting: Neurology

## 2019-11-21 ENCOUNTER — Other Ambulatory Visit: Payer: Self-pay

## 2019-11-21 ENCOUNTER — Ambulatory Visit (INDEPENDENT_AMBULATORY_CARE_PROVIDER_SITE_OTHER): Payer: Managed Care, Other (non HMO) | Admitting: Neurology

## 2019-11-21 VITALS — BP 157/91 | HR 72 | Ht 70.0 in | Wt 286.8 lb

## 2019-11-21 DIAGNOSIS — R202 Paresthesia of skin: Secondary | ICD-10-CM

## 2019-11-21 MED ORDER — FINASTERIDE 5 MG PO TABS: 5.0000 mg | ORAL_TABLET | Freq: Every day | ORAL | 1 refills | Status: DC

## 2019-11-21 NOTE — Addendum Note (Signed)
Addended by: Tonia Ghent on: 11/21/2019 08:17 AM   Modules accepted: Orders

## 2019-11-21 NOTE — Telephone Encounter (Signed)
Left detailed message on voicemail.  

## 2019-11-21 NOTE — Telephone Encounter (Addendum)
He can try finasteride. rx sent. It is slower to work, it make take weeks to have effect but it can decrease the size of prostate and help with urinary sx.  And it cuts PSA in half (while on med) so FYI to patient.  Prev PSA was 0.66.  If we recheck it later on and it "decreases" to ~0.33 on finasteride, then it didn't really change his PSA at all.  Update me as needed.  Thanks.

## 2019-11-21 NOTE — Progress Notes (Signed)
[name] Clinic Note - Initial Visit   Date: 11/21/19  Dustin [name] MRN: [id number] DOB: [name] 15, 1957   Dear Dr. Damita Dunnings:  Thank you for your kind referral of Dustin [name] for consultation of toe tingling. Although his history is well known to you, please allow Dustin [name] to reiterate it for the purpose of our medical record. The patient was accompanied to the clinic by self.    History of Present Illness: Dustin [name] is a 64 y.o. right-handed male with hyperlipidemia, hypertension, and BPH presenting for evaluation of toe tingling. Around June, he had a single episode of tingling pain in the right 3rd toe which recurred over 2 days and then resolved. Since this time, he has not had any further spells of tingling in the toes.  He endorses some numbness in the feet which was detected by Dr. Damita Dunnings on exam, but he does not notice the numbness as much.  No imbalance or leg weakness.  Out-side paper records, electronic medical record, and images have been reviewed where available and summarized as:  No results found for: HGBA1C Lab Results  Component Value Date   VITAMINB12 290 09/23/2019   Lab Results  Component Value Date   TSH 1.16 09/23/2019   No results found for: ESRSEDRATE, POCTSEDRATE  Past Medical History:  Diagnosis Date  . Allergy   . Hyperlipidemia   . Hypertension   . Vertigo 06/03/2015   1 episode  . Wears hearing aid in both ears     Past Surgical History:  Procedure Laterality Date  . ETHMOIDECTOMY Bilateral 02/27/2019   Procedure: ETHMOIDECTOMY;  Surgeon: Margaretha Sheffield, MD;  Location: [facility];  Service: ENT;  Laterality: Bilateral;  . FRONTAL SINUS EXPLORATION Bilateral 02/27/2019   Procedure: FRONTAL SINUS EXPLORATION;  Surgeon: Margaretha Sheffield, MD;  Location: [place];  Service: ENT;  Laterality: Bilateral;  . IMAGE GUIDED SINUS SURGERY Bilateral 02/27/2019   Procedure: IMAGE GUIDED SINUS SURGERY;   Surgeon: Margaretha Sheffield, MD;  Location: [place];  Service: ENT;  Laterality: Bilateral;  . LASIK Bilateral 2005   Dr Lucita Ferrara  . MAXILLARY ANTROSTOMY Bilateral 02/27/2019   Procedure: MAXILLARY ANTROSTOMY WITH TISSUE REMOVAL;  Surgeon: Margaretha Sheffield, MD;  Location: [facility];  Service: ENT;  Laterality: Bilateral;  . SEPTOPLASTY Bilateral 02/27/2019   Procedure: SEPTOPLASTY;  Surgeon: Margaretha Sheffield, MD;  Location: [facility];  Service: ENT;  Laterality: Bilateral;  disk put on desk in charge nurse office, 02/06/2019 ds put 2nd disk on charge nurse desk 10-29 kp  . TURBINATE REDUCTION Bilateral 02/27/2019   Procedure: TURBINATE REDUCTION;  Surgeon: Margaretha Sheffield, MD;  Location: [facility];  Service: ENT;  Laterality: Bilateral;  . TYMPANOSTOMY TUBE PLACEMENT    . VASECTOMY  04/1995     Medications:  Outpatient Encounter Medications as of 11/21/2019  Medication Sig  . amoxicillin-clavulanate (AUGMENTIN) 875-125 MG tablet Take 1 tablet by mouth 2 (two) times daily.  . Calcium-Magnesium-Zinc 167-83-8 MG TABS Take by mouth daily.  . Cholecalciferol (VITAMIN D3 PO) Take by mouth daily.  . finasteride (PROSCAR) 5 MG tablet Take 1 tablet (5 mg total) by mouth daily.  Marland Kitchen GINSENG PO Take by mouth daily.  . hydrochlorothiazide (HYDRODIURIL) 12.5 MG tablet Take 1 tablet (12.5 mg total) by mouth daily. Take an extra 12.5mg  per day if needed for swelling.  . metoprolol succinate (TOPROL-XL) 25 MG 24 hr tablet TAKE ONE TABLET EVERY DAY  . Rhodiola rosea (RHODIOLA PO)  Take by mouth daily.  Marland Kitchen tolnaftate (TINACTIN) 1 % cream Apply 1 application topically 2 (two) times daily.  . fluticasone (FLONASE) 50 MCG/ACT nasal spray USE 1-2 SPRAYS IN BOTH NOSTRILS EVERY DAY (Patient not taking: Reported on 11/21/2019)  . [DISCONTINUED] tamsulosin (FLOMAX) 0.4 MG CAPS capsule Take 1 capsule (0.4 mg total) by mouth daily.   No facility-administered encounter medications on file as  of 11/21/2019.    Allergies:  Allergies  Allergen Reactions  . Antihistamines, Diphenhydramine-Type Other (See Comments)    Insomnia with benadryl, but able to tolerate claritin  . Erythromycin     REACTION: GI UPSET  . Flomax [Tamsulosin]     headache  . Guaifenesin & Derivatives Other (See Comments)    Irritable but able to tolerate med if needed.   . Metoprolol Other (See Comments)    Max tolerated dose 50mg  day, not an allergy    Family History: Family History  Problem Relation Age of Onset  . Hyperlipidemia Mother   . Hypertension Mother   . Coronary artery disease Father   . Stroke Father   . Cancer Father        prostate  . Heart disease Father   . Prostate cancer Father   . COPD Sister   . Diabetes Paternal Uncle   . Diabetes Paternal Grandfather   . Colon cancer Neg Hx     Social History: Social History   Tobacco Use  . Smoking status: Former Smoker    Packs/day: 0.50    Years: 4.00    Pack years: 2.00    Types: Cigarettes    Quit date: 1975    Years since quitting: 46.6  . Smokeless tobacco: Never Used  Vaping Use  . Vaping Use: Never used  Substance Use Topics  . Alcohol use: Yes    Alcohol/week: 0.0 standard drinks    Comment: occ glass of wine/week  . 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  Speech is not dysarthric.  CRANIAL NERVES: II:  No visual field defects.     III-IV-VI: Pupils equal round and reactive to light.  Mild esotropia on the right, extra-ocular eye movements in all directions of gaze.  No nystagmus.  No ptosis.   VII:  Normal facial symmetry and movements.   VIII:  Normal hearing and vestibular function.   XI:  Normal shoulder shrug and head rotation  MOTOR:  No atrophy, fasciculations or abnormal movements.  No pronator drift.   Upper Extremity:  Right  Left  Deltoid  5/5   5/5   Biceps  5/5   5/5   Triceps  5/5   5/5   Infraspinatus 5/5  5/5  Medial pectoralis 5/5  5/5  Wrist extensors  5/5   5/5   Wrist flexors  5/5  5/5   Finger extensors  5/5   5/5   Finger flexors  5/5   5/5   Dorsal interossei  5/5   5/5   Abductor pollicis  5/5   5/5   Tone (Ashworth scale)  0  0   Lower Extremity:  Right  Left  Hip flexors  5/5   5/5   Hip extensors  5/5   5/5   Adductor 5/5  5/5  Abductor 5/5  5/5  Knee flexors  5/5   5/5   Knee extensors  5/5   5/5   Dorsiflexors  5/5   5/5   Plantarflexors  5/5   5/5   Toe extensors  5/5   5/5   Toe flexors  5/5   5/5   Tone (Ashworth scale)  0  0   MSRs:  Right        Left                  brachioradialis 2+  2+  biceps 2+  2+  triceps 2+  2+  patellar 2+  2+  ankle jerk 0  0  Hoffman no  no  plantar response down  down   SENSORY:  Vibration is absent at the great toe bilaterally, intact at the ankles and knees.  Pin prick and temperature is normal.  COORDINATION/GAIT: Normal finger-to- nose-finger. Gait is mildly wide based, stable.  Stressed gait intact.  Mild unsteadiness with tandem gait.   IMPRESSION: Probable early distal and symmetric peripheral neuropathy.  He does not have any risk factors and labs indicate normal TSH and B12.  Suspect this is degenerative neuropathy.  Given that he is not bothered by symptoms, I suggest we monitor him.  If symptoms get worse, NCS/EMG of the legs can be ordered. Patient educated on daily foot inspection and fall prevention  Return to  clinic as needed   Thank you for allowing me to participate in patient's care.  If I can answer any additional questions, I would be pleased to do so.    Sincerely,    Grainne Knights K. Posey Pronto, DO

## 2019-11-21 NOTE — Patient Instructions (Signed)
Come back and see me if your symptoms get worse

## 2019-12-05 ENCOUNTER — Telehealth: Payer: Self-pay

## 2019-12-05 MED ORDER — HYDROCODONE-HOMATROPINE 5-1.5 MG/5ML PO SYRP: 5.0000 mL | ORAL_SOLUTION | Freq: Two times a day (BID) | ORAL | 0 refills | Status: DC | PRN

## 2019-12-05 NOTE — Telephone Encounter (Signed)
Pt calling stating he still has HA, cough and fatigue.  Seen by ENT, as suggested.  Told no sinus inf.  Pt is asking what else he can try since he is still feeling bad.

## 2019-12-05 NOTE — Telephone Encounter (Signed)
Spoke with pt relaying Dr. Josefine Class message.  Pt verbalizes understanding.

## 2019-12-05 NOTE — Addendum Note (Signed)
Addended by: Tonia Ghent on: 12/05/2019 02:12 PM   Modules accepted: Orders

## 2019-12-05 NOTE — Telephone Encounter (Signed)
Hycodan sent for cough.  If that isn't helping then let me know.  Sedation caution.  Thanks.

## 2019-12-08 ENCOUNTER — Telehealth: Payer: Self-pay | Admitting: *Deleted

## 2019-12-08 MED ORDER — HYDROCHLOROTHIAZIDE 12.5 MG PO TABS: 25.0000 mg | ORAL_TABLET | Freq: Every day | ORAL | Status: DC

## 2019-12-08 MED ORDER — METOPROLOL SUCCINATE ER 25 MG PO TB24: 50.0000 mg | ORAL_TABLET | Freq: Every day | ORAL | Status: DC

## 2019-12-08 NOTE — Telephone Encounter (Signed)
Patient called stating that his blood pressure was 170/100 yesterday and today. Patient stated that he has been taking Metoprolol 25 mg, but took an extra one today. Patient rechecked his blood pressure while on the phone and it was 172/103 heart rate 74. Patient stated that he does have a headache and feels real tired and this started Saturday.  Patient stated that he does have a cough and has had that for several months. Patient denies any SOB or difficulty breathing. Patient stated that he had a rapid covid test done Wednesday because he needed to see his ENT Friday and they required the test. Patient stated that he went to see the ENT because he was blowing all kinds of stuff out of his sinuses. Patient stated that he had a CT of his sinuses today. Patient stated that the rapid covid test done Wednesday at [facility] was negative.

## 2019-12-08 NOTE — Telephone Encounter (Signed)
If he is taking any pseudophed, then I would stop that as it can inc his BP.  I would continue metoprolol 25mg  x2 = 50mg  a day.  Would inc HCTZ to 12.5mg  x2 = 25mg  a day for BP.   If he has only taken 1 tab of HCTZ today, then I would take a 2nd dose now.   Starting tomorrow, he can talk all of the BP meds at the same time- he doesn't have to split them up.   Please update me about his BP and pulse in 1-2 days, sooner if needed.   Thanks.

## 2019-12-09 MED ORDER — METOPROLOL SUCCINATE ER 25 MG PO TB24: 50.0000 mg | ORAL_TABLET | Freq: Every day | ORAL | 3 refills | Status: DC

## 2019-12-09 MED ORDER — HYDROCHLOROTHIAZIDE 12.5 MG PO TABS: 25.0000 mg | ORAL_TABLET | Freq: Every day | ORAL | 3 refills | Status: DC

## 2019-12-09 NOTE — Telephone Encounter (Signed)
Patient advised.  New Rx's sent to pharmacy as requested by patient.

## 2019-12-15 ENCOUNTER — Encounter: Payer: Self-pay | Admitting: Family Medicine

## 2019-12-18 NOTE — Telephone Encounter (Signed)
Pt left VM at Triage, he said he is following up on mychart message. PCP is increasing BP med. Pt said he needs new Rx sent to [facility], please send Rx and let pt know once done

## 2019-12-19 ENCOUNTER — Encounter: Payer: Self-pay | Admitting: Family Medicine

## 2019-12-22 ENCOUNTER — Other Ambulatory Visit: Payer: Self-pay | Admitting: Family Medicine

## 2019-12-22 MED ORDER — HYDROCHLOROTHIAZIDE 25 MG PO TABS: 25.0000 mg | ORAL_TABLET | Freq: Every day | ORAL | 3 refills | Status: DC

## 2019-12-22 MED ORDER — METOPROLOL SUCCINATE ER 50 MG PO TB24: 50.0000 mg | ORAL_TABLET | Freq: Every day | ORAL | 3 refills | Status: DC

## 2020-01-02 ENCOUNTER — Ambulatory Visit: Payer: Managed Care, Other (non HMO) | Admitting: Neurology

## 2020-01-18 ENCOUNTER — Encounter: Payer: Self-pay | Admitting: Family Medicine

## 2020-01-19 ENCOUNTER — Encounter: Payer: Managed Care, Other (non HMO) | Admitting: Gastroenterology

## 2020-01-31 ENCOUNTER — Telehealth: Payer: Self-pay | Admitting: Emergency Medicine

## 2020-01-31 DIAGNOSIS — R399 Unspecified symptoms and signs involving the genitourinary system: Secondary | ICD-10-CM

## 2020-01-31 MED ORDER — CEPHALEXIN 500 MG PO CAPS: 500.0000 mg | ORAL_CAPSULE | Freq: Two times a day (BID) | ORAL | 0 refills | Status: AC

## 2020-01-31 MED ORDER — CEPHALEXIN 500 MG PO CAPS: 500.0000 mg | ORAL_CAPSULE | Freq: Two times a day (BID) | ORAL | 0 refills | Status: DC

## 2020-01-31 MED ORDER — PHENAZOPYRIDINE HCL 200 MG PO TABS: 200.0000 mg | ORAL_TABLET | Freq: Three times a day (TID) | ORAL | 0 refills | Status: DC | PRN

## 2020-01-31 NOTE — Progress Notes (Signed)
1057: called in prescription. Pharmacy stating electronic prescriptions from [facility] have not been working since yesterday. Called patient and notified rx for pyridium and keflex called in. He verbalized understanding.

## 2020-01-31 NOTE — Addendum Note (Signed)
Addended by: Kinnie Feil on: 01/31/2020 10:57 AM   Modules accepted: Orders

## 2020-01-31 NOTE — Progress Notes (Signed)
Time spent: 10 min  Dustin [name],   I am sorry that you are not feeling well.  Here is how I plan to help!  Based on what you shared with me it looks like you most likely have a urinary tract infection.  A UTI (Urinary Tract Infection) is a bacterial infection of the bladder.  Most cases of urinary tract infections are simple to treat but a key part of your care is to encourage you to drink plenty of fluids and watch your symptoms carefully.  If symptoms do not improve in 24-48 hours of treatment you need to be seen urgently in person for re-evaluation.  A bladder infection can spread to the kidneys and blood stream if not treated appropriately.    I have prescribed Keflex 500 mg twice a day for 7 days.  Your symptoms should gradually improve in 24-48 hours.   Urinary tract infections can be prevented by drinking plenty of water to keep your body hydrated.  You want your urine to be almost clear in the toilet. Empty your bladder every 4 hours.  Seek immediate in person medical evaluation if you develop:  Fever greater than 100.4 F, chills  Worsening symptoms despite antibiotics for 24-48 hours  Flank or back pain  Inability to void urine with lower abdomen distention or pain (retention)  Your e-visit answers were reviewed by a board certified advanced clinical practitioner to complete your personal care plan.  Depending on the condition, your plan could have included both over the counter or prescription medications.  If there is a problem please reply  once you have received a response from your provider.  Your safety is important to [place].  If you have drug allergies check your prescription carefully.    You can use MyChart to ask questions about todays visit, request a non-urgent call back, or ask for a work or school excuse for 24 hours related to this e-Visit. If it has been greater than 24 hours you will need to follow up with your provider, or enter a new e-Visit to  address those concerns.   You will get an e-mail in the next two days asking about your experience.  I hope that your e-visit has been valuable and will speed your recovery. Thank you for using e-visits.

## 2020-03-17 ENCOUNTER — Other Ambulatory Visit: Payer: Self-pay | Admitting: Family Medicine

## 2020-03-17 DIAGNOSIS — I10 Essential (primary) hypertension: Secondary | ICD-10-CM

## 2020-03-17 DIAGNOSIS — Z125 Encounter for screening for malignant neoplasm of prostate: Secondary | ICD-10-CM

## 2020-03-24 ENCOUNTER — Ambulatory Visit (INDEPENDENT_AMBULATORY_CARE_PROVIDER_SITE_OTHER): Payer: Managed Care, Other (non HMO)

## 2020-03-24 ENCOUNTER — Other Ambulatory Visit: Payer: Self-pay

## 2020-03-24 DIAGNOSIS — Z0000 Encounter for general adult medical examination without abnormal findings: Secondary | ICD-10-CM | POA: Diagnosis not present

## 2020-03-24 NOTE — Progress Notes (Signed)
Subjective:   Dustin MIGLIACCIO is a 64 y.o. male who presents for Medicare Annual/Subsequent preventive examination.  Review of Systems: N/A      I connected with the patient today by telephone and verified that I am speaking with the correct person using two identifiers. Location patient: home Location nurse: work Persons participating in the telephone visit: patient, nurse.   I discussed the limitations, risks, security and privacy concerns of performing an evaluation and management service by telephone and the availability of in person appointments. I also discussed with the patient that there may be a patient responsible charge related to this service. The patient expressed understanding and verbally consented to this telephonic visit.        Cardiac Risk Factors include: advanced age (>22men, >5 women);male gender;hypertension     Objective:    Today's Vitals   There is no height or weight on file to calculate BMI.  Advanced Directives 03/24/2020 11/21/2019 02/27/2019 04/04/2018 05/25/2015  Does Patient Have a Medical Advance Directive? No No No No No  Would patient like information on creating a medical advance directive? No - Patient declined - Yes (MAU/Ambulatory/Procedural Areas - Information given) No - Patient declined No - patient declined information    Current Medications (verified) Outpatient Encounter Medications as of 03/24/2020  Medication Sig  . Calcium-Magnesium-Zinc 167-83-8 MG TABS Take by mouth daily.  . Cholecalciferol (VITAMIN D3 PO) Take by mouth daily.  . finasteride (PROSCAR) 5 MG tablet Take 1 tablet (5 mg total) by mouth daily.  . fluticasone (FLONASE) 50 MCG/ACT nasal spray USE 1-2 SPRAYS IN BOTH NOSTRILS EVERY DAY  . GINSENG PO Take by mouth daily.  . hydrochlorothiazide (HYDRODIURIL) 25 MG tablet Take 1 tablet (25 mg total) by mouth daily.  Marland Kitchen HYDROcodone-homatropine (HYCODAN) 5-1.5 MG/5ML syrup Take 5 mLs by mouth 2 (two) times daily as needed for  cough (sedation caution).  . metoprolol succinate (TOPROL-XL) 50 MG 24 hr tablet Take 1 tablet (50 mg total) by mouth daily.  . phenazopyridine (PYRIDIUM) 200 MG tablet Take 1 tablet (200 mg total) by mouth 3 (three) times daily as needed for pain.  Marland Kitchen Rhodiola rosea (RHODIOLA PO) Take by mouth daily.  Marland Kitchen tolnaftate (TINACTIN) 1 % cream Apply 1 application topically 2 (two) times daily.  Marland Kitchen amoxicillin-clavulanate (AUGMENTIN) 875-125 MG tablet Take 1 tablet by mouth 2 (two) times daily.   No facility-administered encounter medications on file as of 03/24/2020.    Allergies (verified) Antihistamines, diphenhydramine-type; Erythromycin; Flomax [tamsulosin]; Guaifenesin & derivatives; and Metoprolol   History: Past Medical History:  Diagnosis Date  . Allergy   . Hyperlipidemia   . Hypertension   . Vertigo 06/03/2015   1 episode  . Wears hearing aid in both ears    Past Surgical History:  Procedure Laterality Date  . ETHMOIDECTOMY Bilateral 02/27/2019   Procedure: ETHMOIDECTOMY;  Surgeon: Margaretha Sheffield, MD;  Location: [facility];  Service: ENT;  Laterality: Bilateral;  . FRONTAL SINUS EXPLORATION Bilateral 02/27/2019   Procedure: FRONTAL SINUS EXPLORATION;  Surgeon: Margaretha Sheffield, MD;  Location: [facility];  Service: ENT;  Laterality: Bilateral;  . IMAGE GUIDED SINUS SURGERY Bilateral 02/27/2019   Procedure: IMAGE GUIDED SINUS SURGERY;  Surgeon: Margaretha Sheffield, MD;  Location: [facility];  Service: ENT;  Laterality: Bilateral;  . LASIK Bilateral 2005   Dr Lucita Ferrara  . MAXILLARY ANTROSTOMY Bilateral 02/27/2019   Procedure: MAXILLARY ANTROSTOMY WITH TISSUE REMOVAL;  Surgeon: Margaretha Sheffield, MD;  Location: [facility];  Service: ENT;  Laterality: Bilateral;  . SEPTOPLASTY Bilateral 02/27/2019   Procedure: SEPTOPLASTY;  Surgeon: Margaretha Sheffield, MD;  Location: [facility];  Service: ENT;  Laterality: Bilateral;  disk put on desk in charge nurse office,  02/06/2019 ds put 2nd disk on charge nurse desk 10-29 kp  . TURBINATE REDUCTION Bilateral 02/27/2019   Procedure: TURBINATE REDUCTION;  Surgeon: Margaretha Sheffield, MD;  Location: [facility];  Service: ENT;  Laterality: Bilateral;  . TYMPANOSTOMY TUBE PLACEMENT    . VASECTOMY  04/1995   Family History  Problem Relation Age of Onset  . Hyperlipidemia Mother   . Hypertension Mother   . Coronary artery disease Father   . Stroke Father   . Cancer Father        prostate  . Heart disease Father   . Prostate cancer Father   . COPD Sister   . Diabetes Paternal Uncle   . Diabetes Paternal Grandfather   . Colon cancer Neg Hx    Social History   Socioeconomic History  . Marital status: Married    Spouse name: Not on file  . Number of children: 4  . Years of education: Not on file  . Highest education level: Not on file  Occupational History  . Occupation: Reinsurance-Marketing with companies  Tobacco Use  . Smoking status: Former Smoker    Packs/day: 0.50    Years: 4.00    Pack years: 2.00    Types: Cigarettes    Quit date: 1975    Years since quitting: 46.9  . Smokeless tobacco: Never Used  Vaping Use  . Vaping Use: Never used  Substance and Sexual Activity  . Alcohol use: Yes    Alcohol/week: 0.0 standard drinks    Comment: occ glass of wine/week  . Drug use: No  . Sexual activity: Not on file  Other Topics Concern  . 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 Difficulty of Paying Living Expenses: Not hard at all  Food Insecurity: No Food Insecurity  . Worried About Charity fundraiser in the Last Year: Never true  . Ran Out of Food in the Last Year: Never true  Transportation Needs: No Transportation Needs  . Lack  of Transportation (Medical): No  . Lack of Transportation (Non-Medical): No  Physical Activity: Inactive  . Days of Exercise per Week: 0 days  . Minutes of Exercise per Session: 0 min  Stress: No Stress Concern Present  . Feeling of Stress : Not at all  Social Connections:   . Frequency of Communication with Friends and Family: Not on file  . Frequency of Social Gatherings with Friends and Family: Not on file  . Attends Religious Services: Not on file  . Active Member of Clubs or Organizations: Not on file  . Attends Archivist Meetings: Not on file  . Marital Status: Not on file    Tobacco Counseling Counseling given: Not Answered   Clinical Intake:  Pre-visit preparation completed: Yes  Pain : No/denies pain     Diabetes: No  How often do you need to have someone help you when you read instructions, pamphlets, or other written materials from your doctor  or pharmacy?: 1 - Never What is the last grade level you completed in school?: masters  Diabetic: No Nutrition Risk Assessment:  Has the patient had any N/V/D within the last 2 months?  No  Does the patient have any non-healing wounds?  No  Has the patient had any unintentional weight loss or weight gain?  No   Diabetes:  Is the patient diabetic?  No  If diabetic, was a CBG obtained today?  N/A Did the patient bring in their glucometer from home?  N/A How often do you monitor your CBG's? N/A.   Financial Strains and Diabetes Management:  Are you having any financial strains with the device, your supplies or your medication? N/A.  Does the patient want to be seen by Chronic Care Management for management of their diabetes?  N/A Would the patient like to be referred to a Nutritionist or for Diabetic Management?  N/A   Interpreter Needed?: No  Information entered by :: CJohnson, LPN   Activities of Daily Living In your present state of health, do you have any difficulty performing the following  activities: 03/24/2020  Hearing? Y  Comment wears hearing aids  Vision? N  Difficulty concentrating or making decisions? N  Walking or climbing stairs? N  Dressing or bathing? N  Doing errands, shopping? N  Preparing Food and eating ? N  Using the Toilet? N  In the past six months, have you accidently leaked urine? N  Do you have problems with loss of bowel control? N  Managing your Medications? N  Managing your Finances? N  Housekeeping or managing your Housekeeping? N  Some recent data might be hidden    Patient Care Team: Tonia Ghent, MD as PCP - General  Indicate any recent Medical Services you may have received from other than [facility] providers in the past year (date may be approximate).     Assessment:   This is a routine wellness examination for Strummer.  Hearing/Vision screen  Hearing Screening   125Hz  250Hz  500Hz  1000Hz  2000Hz  3000Hz  4000Hz  6000Hz  8000Hz   Right ear:           Left ear:           Vision Screening Comments: Advised patient to get annual eye exams    Dietary issues and exercise activities discussed: Current Exercise Habits: The patient does not participate in regular exercise at present, Exercise limited by: None identified  Goals    . Patient Stated     03/24/2020, I will maintain and continue medications as prescribed.       Depression Screen PHQ 2/9 Scores 03/24/2020 02/18/2018 02/07/2017 02/02/2016  PHQ - 2 Score 0 0 0 0  PHQ- 9 Score 0 - - -    Fall Risk Fall Risk  03/24/2020 11/21/2019 02/02/2016  Falls in the past year? 0 0 Yes  Number falls in past yr: 0 0 1  Injury with Fall? 0 0 -  Risk for fall due to : Medication side effect - Other (Comment)  Follow up Falls prevention discussed;Falls evaluation completed - -    FALL RISK PREVENTION PERTAINING TO THE HOME:  Any stairs in or around the home? Yes  If so, are there any without handrails? No  Home free of loose throw rugs in walkways, pet beds, electrical cords, etc? Yes   Adequate lighting in your home to reduce risk of falls? Yes   ASSISTIVE DEVICES UTILIZED TO PREVENT FALLS:  Life alert? No  Use of a  cane, walker or w/c? No  Grab bars in the bathroom? No  Shower chair or bench in shower? No  Elevated toilet seat or a handicapped toilet? No   TIMED UP AND GO:  Was the test performed? N/A, telephone visit .    Cognitive Function: MMSE - Mini Mental State Exam 03/24/2020  Orientation to time 5  Orientation to Place 5  Registration 3  Attention/ Calculation 5  Recall 3  Language- repeat 1        Immunizations Immunization History  Administered Date(s) Administered  . Influenza Whole 02/11/2013  . Influenza,inj,Quad PF,6+ Mos 02/09/2015, 02/02/2016, 01/05/2017, 02/18/2018  . Influenza,inj,quad, With Preservative 01/25/2019  . Influenza-Unspecified 01/14/2014  . Moderna SARS-COVID-2 Vaccination 06/18/2019, 07/16/2019  . Td 09/30/2001, 11/01/2018  . Tdap 12/14/2011  . Zoster Recombinat (Shingrix) 12/29/2017, 03/23/2018    TDAP status: Up to date  Flu Vaccine status: due, will get at upcoming visit   Pneumococcal vaccine status: due, will get at upcoming visit    Covid-19 vaccine status: Completed vaccines  Qualifies for Shingles Vaccine? Yes   Zostavax completed No   Shingrix Completed?: Yes  Screening Tests Health Maintenance  Topic Date Due  . COLONOSCOPY  09/02/2015  . PNA vac Low Risk Adult (1 of 2 - PCV13) Never done  . INFLUENZA VACCINE  11/16/2019  . TETANUS/TDAP  10/31/2028  . COVID-19 Vaccine  Completed  . Hepatitis C Screening  Completed    Health Maintenance  Health Maintenance Due  Topic Date Due  . COLONOSCOPY  09/02/2015  . PNA vac Low Risk Adult (1 of 2 - PCV13) Never done  . INFLUENZA VACCINE  11/16/2019    Colorectal cancer screening: Patient states he will call and schedule this appointment. Has already been notified by GI  Lung Cancer Screening: (Low Dose CT Chest recommended if Age 32-80 years,  30 pack-year currently smoking OR have quit w/in 15 years.) does not qualify.    Additional Screening:  Hepatitis C Screening: does qualify; Completed 01/31/2016  Vision Screening: Recommended annual ophthalmology exams for early detection of glaucoma and other disorders of the eye. Is the patient up to date with their annual eye exam?  No , will schedule appointment soon  Who is the provider or what is the name of the office in which the patient attends annual eye exams? [facility] If pt is not established with a provider, would they like to be referred to a provider to establish care? No .   Dental Screening: Recommended annual dental exams for proper oral hygiene  Community Resource Referral / Chronic Care Management: CRR required this visit?  No   CCM required this visit?  No      Plan:     I have personally reviewed and noted the following in the patient's chart:   . Medical and social history . Use of alcohol, tobacco or illicit drugs  . Current medications and supplements . Functional ability and status . Nutritional status . Physical activity . Advanced directives . List of other physicians . Hospitalizations, surgeries, and ER visits in previous 12 months . Vitals . Screenings to include cognitive, depression, and falls . Referrals and appointments  In addition, I have reviewed and discussed with patient certain preventive protocols, quality metrics, and best practice recommendations. A written personalized care plan for preventive services as well as general preventive health recommendations were provided to patient.   Due to this being a telephonic visit, the after visit summary with patients  personalized plan was offered to patient via office or my-chart. Patient preferred to pick up at office at next visit or via mychart.   Andrez Grime, LPN

## 2020-03-24 NOTE — Progress Notes (Signed)
PCP notes:  Health Maintenance: Flu- due Prevnar 13- due Colonoscopy- Patient will call and schedule this   Abnormal Screenings: none   Patient concerns: Recurrent sinus and UTI infections   Nurse concerns: none   Next PCP appt.: 03/29/2020 @ 3:30 pm

## 2020-03-24 NOTE — Patient Instructions (Signed)
Dustin [name] , Thank you for taking time to come for your Medicare Wellness Visit. I appreciate your ongoing commitment to your health goals. Please review the following plan we discussed and let me know if I can assist you in the future.   Screening recommendations/referrals: Colonoscopy: Patient states that he will call and schedule this himself.  Recommended yearly ophthalmology/optometry visit for glaucoma screening and checkup Recommended yearly dental visit for hygiene and checkup  Vaccinations: Influenza vaccine: due, will get at upcoming office visit  Pneumococcal vaccine: due, will get at upcoming office visit  Tdap vaccine: Up to date, completed 11/01/2018, due 10/2028 Shingles vaccine: Completed series COVID: Completed series    Advanced directives: Advance directive discussed with you today. Even though you declined this today please call our office should you change your mind and we can give you the proper paperwork for you to fill out.  Conditions/risks identified: hypertension  Next appointment: 03/29/2020 @ 3:30 pm physical with Dr. Damita Dunnings   Preventive Care 65 Years and Older, Male Preventive care refers to lifestyle choices and visits with your health care provider that can promote health and wellness. What does preventive care include?  A yearly physical exam. This is also called an annual well check.  Dental exams once or twice a year.  Routine eye exams. Ask your health care provider how often you should have your eyes checked.  Personal lifestyle choices, including:  Daily care of your teeth and gums.  Regular physical activity.  Eating a healthy diet.  Avoiding tobacco and drug use.  Limiting alcohol use.  Practicing safe sex.  Taking low doses of aspirin every day.  Taking vitamin and mineral supplements as recommended by your health care provider. What happens during an annual well check? The services and screenings done by your health care provider  during your annual well check will depend on your age, overall health, lifestyle risk factors, and family history of disease. Counseling  Your health care provider may ask you questions about your:  Alcohol use.  Tobacco use.  Drug use.  Emotional well-being.  Home and relationship well-being.  Sexual activity.  Eating habits.  History of falls.  Memory and ability to understand (cognition).  Work and work Statistician. Screening  You may have the following tests or measurements:  Height, weight, and BMI.  Blood pressure.  Lipid and cholesterol levels. These may be checked every 5 years, or more frequently if you are over 38 years old.  Skin check.  Lung cancer screening. You may have this screening every year starting at age 42 if you have a 30-pack-year history of smoking and currently smoke or have quit within the past 15 years.  Fecal occult blood test (FOBT) of the stool. You may have this test every year starting at age 29.  Flexible sigmoidoscopy or colonoscopy. You may have a sigmoidoscopy every 5 years or a colonoscopy every 10 years starting at age 68.  Prostate cancer screening. Recommendations will vary depending on your family history and other risks.  Hepatitis C blood test.  Hepatitis B blood test.  Sexually transmitted disease (STD) testing.  Diabetes screening. This is done by checking your blood sugar (glucose) after you have not eaten for a while (fasting). You may have this done every 1-3 years.  Abdominal aortic aneurysm (AAA) screening. You may need this if you are a current or former smoker.  Osteoporosis. You may be screened starting at age 33 if you are at high risk. Talk  with your health care provider about your test results, treatment options, and if necessary, the need for more tests. Vaccines  Your health care provider may recommend certain vaccines, such as:  Influenza vaccine. This is recommended every year.  Tetanus,  diphtheria, and acellular pertussis (Tdap, Td) vaccine. You may need a Td booster every 10 years.  Zoster vaccine. You may need this after age 37.  Pneumococcal 13-valent conjugate (PCV13) vaccine. One dose is recommended after age 10.  Pneumococcal polysaccharide (PPSV23) vaccine. One dose is recommended after age 3. Talk to your health care provider about which screenings and vaccines you need and how often you need them. This information is not intended to replace advice given to you by your health care provider. Make sure you discuss any questions you have with your health care provider. Document Released: 04/30/2015 Document Revised: 12/22/2015 Document Reviewed: 02/02/2015 Elsevier Interactive Patient Education  2017 [place] Prevention in the Home Falls can cause injuries. They can happen to people of all ages. There are many things you can do to make your home safe and to help prevent falls. What can I do on the outside of my home?  Regularly fix the edges of walkways and driveways and fix any cracks.  Remove anything that might make you trip as you walk through a door, such as a raised step or threshold.  Trim any bushes or trees on the path to your home.  Use bright outdoor lighting.  Clear any walking paths of anything that might make someone trip, such as rocks or tools.  Regularly check to see if handrails are loose or broken. Make sure that both sides of any steps have handrails.  Any raised decks and porches should have guardrails on the edges.  Have any leaves, snow, or ice cleared regularly.  Use sand or salt on walking paths during winter.  Clean up any spills in your garage right away. This includes oil or grease spills. What can I do in the bathroom?  Use night lights.  Install grab bars by the toilet and in the tub and shower. Do not use towel bars as grab bars.  Use non-skid mats or decals in the tub or shower.  If you need to sit down in  the shower, use a plastic, non-slip stool.  Keep the floor dry. Clean up any water that spills on the floor as soon as it happens.  Remove soap buildup in the tub or shower regularly.  Attach bath mats securely with double-sided non-slip rug tape.  Do not have throw rugs and other things on the floor that can make you trip. What can I do in the bedroom?  Use night lights.  Make sure that you have a light by your bed that is easy to reach.  Do not use any sheets or blankets that are too big for your bed. They should not hang down onto the floor.  Have a firm chair that has side arms. You can use this for support while you get dressed.  Do not have throw rugs and other things on the floor that can make you trip. What can I do in the kitchen?  Clean up any spills right away.  Avoid walking on wet floors.  Keep items that you use a lot in easy-to-reach places.  If you need to reach something above you, use a strong step stool that has a grab bar.  Keep electrical cords out of the way.  Do  not use floor polish or wax that makes floors slippery. If you must use wax, use non-skid floor wax.  Do not have throw rugs and other things on the floor that can make you trip. What can I do with my stairs?  Do not leave any items on the stairs.  Make sure that there are handrails on both sides of the stairs and use them. Fix handrails that are broken or loose. Make sure that handrails are as long as the stairways.  Check any carpeting to make sure that it is firmly attached to the stairs. Fix any carpet that is loose or worn.  Avoid having throw rugs at the top or bottom of the stairs. If you do have throw rugs, attach them to the floor with carpet tape.  Make sure that you have a light switch at the top of the stairs and the bottom of the stairs. If you do not have them, ask someone to add them for you. What else can I do to help prevent falls?  Wear shoes that:  Do not have high  heels.  Have rubber bottoms.  Are comfortable and fit you well.  Are closed at the toe. Do not wear sandals.  If you use a stepladder:  Make sure that it is fully opened. Do not climb a closed stepladder.  Make sure that both sides of the stepladder are locked into place.  Ask someone to hold it for you, if possible.  Clearly mark and make sure that you can see:  Any grab bars or handrails.  First and last steps.  Where the edge of each step is.  Use tools that help you move around (mobility aids) if they are needed. These include:  Canes.  Walkers.  Scooters.  Crutches.  Turn on the lights when you go into a dark area. Replace any light bulbs as soon as they burn out.  Set up your furniture so you have a clear path. Avoid moving your furniture around.  If any of your floors are uneven, fix them.  If there are any pets around you, be aware of where they are.  Review your medicines with your doctor. Some medicines can make you feel dizzy. This can increase your chance of falling. Ask your doctor what other things that you can do to help prevent falls. This information is not intended to replace advice given to you by your health care provider. Make sure you discuss any questions you have with your health care provider. Document Released: 01/28/2009 Document Revised: 09/09/2015 Document Reviewed: 05/08/2014 Elsevier Interactive Patient Education  2017 [employer].

## 2020-03-25 ENCOUNTER — Other Ambulatory Visit (INDEPENDENT_AMBULATORY_CARE_PROVIDER_SITE_OTHER): Payer: Medicare Other

## 2020-03-25 ENCOUNTER — Other Ambulatory Visit: Payer: Self-pay

## 2020-03-25 DIAGNOSIS — I10 Essential (primary) hypertension: Secondary | ICD-10-CM

## 2020-03-25 DIAGNOSIS — Z125 Encounter for screening for malignant neoplasm of prostate: Secondary | ICD-10-CM | POA: Diagnosis not present

## 2020-03-25 LAB — COMPREHENSIVE METABOLIC PANEL
ALT: 21 U/L (ref 0–53)
AST: 15 U/L (ref 0–37)
Albumin: 4.2 g/dL (ref 3.5–5.2)
Alkaline Phosphatase: 77 U/L (ref 39–117)
BUN: 23 mg/dL (ref 6–23)
CO2: 31 mEq/L (ref 19–32)
Calcium: 9.8 mg/dL (ref 8.4–10.5)
Chloride: 100 mEq/L (ref 96–112)
Creatinine, Ser: 0.97 mg/dL (ref 0.40–1.50)
GFR: 81.58 mL/min (ref >60.00)
Glucose, Bld: 105 mg/dL — ABNORMAL HIGH (ref 70–99)
Potassium: 3.9 mEq/L (ref 3.5–5.1)
Sodium: 139 mEq/L (ref 135–145)
Total Bilirubin: 0.5 mg/dL (ref 0.2–1.2)
Total Protein: 6.8 g/dL (ref 6.0–8.3)

## 2020-03-25 LAB — LIPID PANEL
Cholesterol: 158 mg/dL (ref 0–200)
HDL: 40.7 mg/dL (ref >39.00)
LDL Cholesterol: 89 mg/dL (ref 0–99)
NonHDL: 117.54
Total CHOL/HDL Ratio: 4
Triglycerides: 143 mg/dL (ref 0.0–149.0)
VLDL: 28.6 mg/dL (ref 0.0–40.0)

## 2020-03-25 LAB — PSA, MEDICARE: PSA: 0.21 ng/ml (ref 0.10–4.00)

## 2020-03-29 ENCOUNTER — Encounter: Payer: Self-pay | Admitting: Family Medicine

## 2020-03-29 ENCOUNTER — Ambulatory Visit (INDEPENDENT_AMBULATORY_CARE_PROVIDER_SITE_OTHER): Payer: Managed Care, Other (non HMO) | Admitting: Family Medicine

## 2020-03-29 ENCOUNTER — Other Ambulatory Visit: Payer: Self-pay

## 2020-03-29 VITALS — BP 140/84 | HR 74 | Temp 96.3°F | Ht 68.0 in | Wt 283.2 lb

## 2020-03-29 DIAGNOSIS — J309 Allergic rhinitis, unspecified: Secondary | ICD-10-CM

## 2020-03-29 DIAGNOSIS — Z0000 Encounter for general adult medical examination without abnormal findings: Secondary | ICD-10-CM

## 2020-03-29 DIAGNOSIS — Z7189 Other specified counseling: Secondary | ICD-10-CM

## 2020-03-29 DIAGNOSIS — R351 Nocturia: Secondary | ICD-10-CM

## 2020-03-29 DIAGNOSIS — I10 Essential (primary) hypertension: Secondary | ICD-10-CM

## 2020-03-29 NOTE — Progress Notes (Signed)
This visit occurred during the SARS-CoV-2 public health emergency.  Safety protocols were in place, including screening questions prior to the visit, additional usage of staff PPE, and extensive cleaning of exam room while observing appropriate contact time as indicated for disinfecting solutions.  Tetanus 2013 Flu 2021 PNA 2021 Shingles prev done.   covid vaccine 2021 HIV and HCV neg prev.  PSA wnl. Dw pt. FH noted.  Colonoscopy due.  D/w pt.  He'll call to schedule.   Living will d/w pt. Wife designated if patient were incapacitated.  Diet and exercise d/w pt. Encouraged both, discussed. AAA screening done 2021.    Hypertension:    Using medication without problems or lightheadedness: yes Chest pain with exertion:no Edema:no Short of breath:he attributed to deconditioning.  No CP with exertion.   We talked about flonase use and rhinorrhea/sinus sx and other prev infections.  It likely makes sense to continue with allergy clinic evaluation and treatment.  Still on finasteride at baseline.  Nocturia is better.  No dysuria now.  No fevers, no chills.   Meds, vitals, and allergies reviewed.   PMH and SH reviewed  ROS: Per HPI unless specifically indicated in ROS section   GEN: nad, alert and oriented HEENT: ncat NECK: supple w/o LA CV: rrr. PULM: ctab, no inc wob ABD: soft, +bs EXT: no edema SKIN: no acute rash

## 2020-03-29 NOTE — Patient Instructions (Addendum)
Flu and pneumonia shot today.  covid booster after that.   Keep working on diet and exercise.  Take care.  Glad to see you. Call about the colonoscopy when possible.

## 2020-03-31 DIAGNOSIS — Z0000 Encounter for general adult medical examination without abnormal findings: Secondary | ICD-10-CM | POA: Insufficient documentation

## 2020-03-31 NOTE — Assessment & Plan Note (Signed)
Living will d/w pt.  Wife designated if patient were incapacitated.   ?

## 2020-03-31 NOTE — Assessment & Plan Note (Signed)
Continue metoprolol and hydrochlorothiazide.  Labs discussed with patient.  Discussed diet and exercise.

## 2020-03-31 NOTE — Assessment & Plan Note (Signed)
Still on finasteride at baseline.  Nocturia is better.  No dysuria now.  No fevers, no chills.  Decrease in PSA related to finasteride use discussed with patient.

## 2020-03-31 NOTE — Assessment & Plan Note (Signed)
We talked about flonase use and rhinorrhea/sinus sx and other prev infections.  It likely makes sense to continue with allergy clinic evaluation and treatment.

## 2020-03-31 NOTE — Assessment & Plan Note (Signed)
Tetanus 2013 Flu 2021 PNA 2021 Shingles prev done.   covid vaccine 2021 HIV and HCV neg prev.  PSA wnl. Dw pt. FH noted.  Colonoscopy due.  D/w pt.  He'll call to schedule.   Living will d/w pt. Wife designated if patient were incapacitated.  Diet and exercise d/w pt. Encouraged both, discussed. AAA screening done 2021.

## 2020-04-26 ENCOUNTER — Telehealth: Payer: Managed Care, Other (non HMO) | Admitting: Family

## 2020-04-26 DIAGNOSIS — R399 Unspecified symptoms and signs involving the genitourinary system: Secondary | ICD-10-CM | POA: Diagnosis not present

## 2020-04-26 MED ORDER — CIPROFLOXACIN HCL 500 MG PO TABS: 500.0000 mg | ORAL_TABLET | Freq: Two times a day (BID) | ORAL | 0 refills | Status: DC

## 2020-04-26 NOTE — Progress Notes (Signed)
We are sorry that you are not feeling well.  Here is how we plan to help!  Based on what you shared with me it looks like you most likely have a simple urinary tract infection.  A UTI (Urinary Tract Infection) is a bacterial infection of the bladder.  We usually do not treat men for UTI's over an Evisit. Given COVID and ect, I have decided to give you an antibiotic. However, you need to call your PCP office tomorrow and make a follow up appointment.  Most cases of urinary tract infections are simple to treat but a key part of your care is to encourage you to drink plenty of fluids and watch your symptoms carefully.  I have prescribed Cipro 500 mg twice a day for 7 days.  Your symptoms should gradually improve. Call us if the burning in your urine worsens, you develop worsening fever, back pain or pelvic pain or if your symptoms do not resolve after completing the antibiotic.  Urinary tract infections can be prevented by drinking plenty of water to keep your body hydrated.  Also be sure when you wipe, wipe from front to back and don't hold it in!  If possible, empty your bladder every 4 hours.  Your e-visit answers were reviewed by a board certified advanced clinical practitioner to complete your personal care plan.  Depending on the condition, your plan could have included both over the counter or prescription medications.  If there is a problem please reply  once you have received a response from your provider.  Your safety is important to [place].  If you have drug allergies check your prescription carefully.    You can use MyChart to ask questions about today's visit, request a non-urgent call back, or ask for a work or school excuse for 24 hours related to this e-Visit. If it has been greater than 24 hours you will need to follow up with your provider, or enter a new e-Visit to address those concerns.   You will get an e-mail in the next two days asking about your experience.  I hope that your  e-visit has been valuable and will speed your recovery. Thank you for using e-visits.   Approximately 5 minutes was spent documenting and reviewing patient's chart.

## 2020-05-05 ENCOUNTER — Telehealth: Payer: Self-pay

## 2020-05-05 NOTE — Telephone Encounter (Signed)
Given his exposure to a known positive and recent onset of symptoms, I would presume he had COVID even if he ends up with a negative test.  It is possible to have COVID but get a false negative test.  Since he has been vaccinated he should be at much lower risk compared to an unvaccinated patient.  If he is clearly getting worse in the meantime then please have him call back to the clinic/seek evaluation.  The good news is that in his case supportive care (meaning rest and fluids, Tylenol if needed) should be sufficient.  The current guidelines recommend 5 days quarantine followed by 5 days of strict masking.  If he is at that point that he may be able to attend the event, assuming he is feeling better in the meantime.  Thanks.

## 2020-05-05 NOTE — Telephone Encounter (Addendum)
Pt left v/m wife dx + covid 05/05/20 at [facility] clinic; pt has minor h/a and body aches; pt is already scheduled for covid test on 05/06/20 at [facility]; pt is taking regular meds and recently finished cipro for UTI. Pt wants to know if anything else he needs to do. Left v/m requesting pt to cb. Pt called back and pts wife developed symptoms on 05/01/20; pts wife + covid  on 05/05/20. Pt said he has minor h/a,body aches, dry cough,scratchy S/T; no fever and no more SOB than usual. Pt said he had Moderna booster at [facility] on 04/30/20 (immunization list updated). Pt was advised to drink plenty of fluids, rest, tylenol if develops fever and self quarantine. Pt has a [place] event coming up on 05/14/20 and 05/15/20 and pt wants to know if Dr Damita Dunnings thinks he will be able to attend if does a 5 day quarantine. pts covid test scheduled at [facility] on 05/06/20 is a PCR test and pt will call Dr Damita Dunnings with the results. UC & ED precautions given and pt voiced understanding also. Sending note to Dr Damita Dunnings.

## 2020-05-06 MED ORDER — DEXTROMETHORPHAN HBR 15 MG/5ML PO SYRP
5.0000 mL | ORAL_SOLUTION | Freq: Four times a day (QID) | ORAL | 1 refills | Status: DC | PRN
Start: 2020-05-06 — End: 2021-04-04

## 2020-05-06 NOTE — Telephone Encounter (Signed)
Cough syrup rx sent. Thanks.  Update me as needed.    He has limited options on cough meds given the h/o guaifenesin intolerance but dextromethorphan may help.

## 2020-05-06 NOTE — Telephone Encounter (Signed)
Pt has called back asking what he can do for his cough. He has an old rx of tessalon perles that help for a short period of time. Only has 4 left. Asking if he can get something else. He uses [facility]. Please advise [phone]

## 2020-05-06 NOTE — Telephone Encounter (Signed)
LMTCB

## 2020-05-07 ENCOUNTER — Telehealth: Payer: Self-pay | Admitting: *Deleted

## 2020-05-07 NOTE — Telephone Encounter (Signed)
Called and spoke with patient who reports symptoms of H/A, nausea, tiredness, sore throat, and cough. Patient denies vomiting, SOB, chest pan, diarrhea, fever, or loss of taste/smell. Patient stated that in addition to the cough medicine he has been taking ibuprofen. Advised patient to continue medication along with drinking plenty of fluids and rest. Advised patient to continue to quarantine until his test results come back. UC and ED precautions given if he develops worsening symptoms. Patient verbalized understanding.

## 2020-05-07 NOTE — Telephone Encounter (Signed)
Left message for patient to let him know rx for cough syrup was sent in and if he had any questions or concerns to let us know

## 2020-05-07 NOTE — Telephone Encounter (Signed)
Agree with all of that.  Thanks.

## 2020-05-07 NOTE — Telephone Encounter (Signed)
Mr. Gulino left message on triage line that the cough medication Dr. Damita Dunnings sent in for him this week helped but today he is not feeling well today.  He wife has tested positive for Covid and his test is still pending.  He is asking for a call back. [phone].

## 2020-05-10 ENCOUNTER — Encounter: Payer: Self-pay | Admitting: Family Medicine

## 2020-05-21 ENCOUNTER — Other Ambulatory Visit: Payer: Self-pay | Admitting: Family Medicine

## 2020-06-05 ENCOUNTER — Telehealth: Payer: Medicare Other | Admitting: Physician Assistant

## 2020-06-05 ENCOUNTER — Encounter: Payer: Self-pay | Admitting: Physician Assistant

## 2020-06-05 DIAGNOSIS — J018 Other acute sinusitis: Secondary | ICD-10-CM

## 2020-06-05 MED ORDER — AMOXICILLIN-POT CLAVULANATE 875-125 MG PO TABS: 1.0000 | ORAL_TABLET | Freq: Two times a day (BID) | ORAL | 0 refills | Status: DC

## 2020-06-05 NOTE — Progress Notes (Signed)
We are sorry that you are not feeling well.  Here is how we plan to help!  Based on what you have shared with me it looks like you have sinusitis.  Sinusitis is inflammation and infection in the sinus cavities of the head.  Based on your presentation I believe you most likely have Acute Bacterial Sinusitis.  This is an infection caused by bacteria and is treated with antibiotics. I have prescribed Augmentin 875mg /125mg  one tablet twice daily with food, for 7 days. Due to chronicity of your symptoms (> 7 days) will treat with antibiotics.   You may use an oral decongestant such as Mucinex D or if you have glaucoma or high blood pressure use plain Mucinex. Saline nasal spray help and can safely be used as often as needed for congestion.  If you develop worsening sinus pain, fever or notice severe headache and vision changes, or if symptoms are not better after completion of antibiotic, please schedule an appointment with a health care provider.    Sinus infections are not as easily transmitted as other respiratory infection, however we still recommend that you avoid close contact with loved ones, especially the very young and elderly.  Remember to wash your hands thoroughly throughout the day as this is the number one way to prevent the spread of infection!  Home Care:  Only take medications as instructed by your medical team.  Complete the entire course of an antibiotic.  Do not take these medications with alcohol.  A steam or ultrasonic humidifier can help congestion.  You can place a towel over your head and breathe in the steam from hot water coming from a faucet.  Avoid close contacts especially the very young and the elderly.  Cover your mouth when you cough or sneeze.  Always remember to wash your hands.  Get Help Right Away If:  You develop worsening fever or sinus pain.  You develop a severe head ache or visual changes.  Your symptoms persist after you have completed your  treatment plan.  Make sure you  Understand these instructions.  Will watch your condition.  Will get help right away if you are not doing well or get worse.  Your e-visit answers were reviewed by a board certified advanced clinical practitioner to complete your personal care plan.  Depending on the condition, your plan could have included both over the counter or prescription medications.  If there is a problem please reply  once you have received a response from your provider.  Your safety is important to [place].  If you have drug allergies check your prescription carefully.    You can use MyChart to ask questions about today's visit, request a non-urgent call back, or ask for a work or school excuse for 24 hours related to this e-Visit. If it has been greater than 24 hours you will need to follow up with your provider, or enter a new e-Visit to address those concerns.  You will get an e-mail in the next two days asking about your experience.  I hope that your e-visit has been valuable and will speed your recovery. Thank you for using e-visits.   I spent 5-10 minutes on review and completion of this note- Lacy Duverney [facility]

## 2020-07-26 ENCOUNTER — Telehealth: Payer: Self-pay

## 2020-07-26 NOTE — Telephone Encounter (Addendum)
I would stay off finasteride for now and see how he does before starting another med, esp since he didn't tolerate flomax.  He may still get some residual benefit for urinary sx after stopping finasteride.  FYI, his PSA may increase on next check when off the med.  Have him update me as needed.  Thanks.

## 2020-07-26 NOTE — Telephone Encounter (Signed)
Patient stated that he has been taking finasteride for the past couple of months and he has experienced fatigue, stomach upset, and "sexual issues." Patient stated that he quit taking the medication yesterday and is wanting to know if there is any other medication that he can take? Please advise.

## 2020-07-26 NOTE — Addendum Note (Signed)
Addended by: Tonia Ghent on: 07/26/2020 05:11 PM   Modules accepted: Orders

## 2020-07-27 NOTE — Telephone Encounter (Signed)
lmtcb

## 2020-07-27 NOTE — Telephone Encounter (Signed)
Spoke with patient about staying off the finasteride and patient states he has not taken he medication since yesterday morning and already feels better. He will keep [place] updated as needed.

## 2020-08-26 ENCOUNTER — Telehealth: Payer: Medicare Other | Admitting: Physician Assistant

## 2020-08-26 DIAGNOSIS — J329 Chronic sinusitis, unspecified: Secondary | ICD-10-CM | POA: Diagnosis not present

## 2020-08-26 DIAGNOSIS — B9689 Other specified bacterial agents as the cause of diseases classified elsewhere: Secondary | ICD-10-CM

## 2020-08-27 MED ORDER — AMOXICILLIN-POT CLAVULANATE 875-125 MG PO TABS: 1.0000 | ORAL_TABLET | Freq: Two times a day (BID) | ORAL | 0 refills | Status: DC

## 2020-08-27 NOTE — Progress Notes (Signed)
We are sorry that you are not feeling well.  Here is how we plan to help!  Based on what you have shared with me it looks like you have sinusitis.  Sinusitis is inflammation and infection in the sinus cavities of the head.  Based on your presentation I believe you most likely have Acute Bacterial Sinusitis.  This is an infection caused by bacteria and is treated with antibiotics. I have prescribed Augmentin 875mg/125mg one tablet twice daily with food, for 7 days. You may use an oral decongestant such as Mucinex D or if you have glaucoma or high blood pressure use plain Mucinex. Saline nasal spray help and can safely be used as often as needed for congestion.  If you develop worsening sinus pain, fever or notice severe headache and vision changes, or if symptoms are not better after completion of antibiotic, please schedule an appointment with a health care provider.    Sinus infections are not as easily transmitted as other respiratory infection, however we still recommend that you avoid close contact with loved ones, especially the very young and elderly.  Remember to wash your hands thoroughly throughout the day as this is the number one way to prevent the spread of infection!  Home Care:  Only take medications as instructed by your medical team.  Complete the entire course of an antibiotic.  Do not take these medications with alcohol.  A steam or ultrasonic humidifier can help congestion.  You can place a towel over your head and breathe in the steam from hot water coming from a faucet.  Avoid close contacts especially the very young and the elderly.  Cover your mouth when you cough or sneeze.  Always remember to wash your hands.  Get Help Right Away If:  You develop worsening fever or sinus pain.  You develop a severe head ache or visual changes.  Your symptoms persist after you have completed your treatment plan.  Make sure you  Understand these instructions.  Will watch your  condition.  Will get help right away if you are not doing well or get worse.  Your e-visit answers were reviewed by a board certified advanced clinical practitioner to complete your personal care plan.  Depending on the condition, your plan could have included both over the counter or prescription medications.  If there is a problem please reply  once you have received a response from your provider.  Your safety is important to us.  If you have drug allergies check your prescription carefully.    You can use MyChart to ask questions about today's visit, request a non-urgent call back, or ask for a work or school excuse for 24 hours related to this e-Visit. If it has been greater than 24 hours you will need to follow up with your provider, or enter a new e-Visit to address those concerns.  You will get an e-mail in the next two days asking about your experience.  I hope that your e-visit has been valuable and will speed your recovery. Thank you for using e-visits.  I provided 6 minutes of non face-to-face time during this encounter for chart review and documentation.   

## 2020-10-06 ENCOUNTER — Telehealth: Payer: Medicare Other | Admitting: Physician Assistant

## 2020-10-06 DIAGNOSIS — J019 Acute sinusitis, unspecified: Secondary | ICD-10-CM | POA: Diagnosis not present

## 2020-10-06 DIAGNOSIS — B9789 Other viral agents as the cause of diseases classified elsewhere: Secondary | ICD-10-CM | POA: Diagnosis not present

## 2020-10-06 MED ORDER — DOXYCYCLINE HYCLATE 100 MG PO CAPS: 100.0000 mg | ORAL_CAPSULE | Freq: Two times a day (BID) | ORAL | 0 refills | Status: DC

## 2020-10-06 NOTE — Progress Notes (Signed)

## 2020-10-06 NOTE — Progress Notes (Signed)
I have spent 5 minutes in review of e-visit questionnaire, review and updating patient chart, medical decision making and response to patient.   Walton Cody Maral Lampe, PA-C    

## 2020-11-11 ENCOUNTER — Telehealth: Payer: Self-pay

## 2020-11-11 MED ORDER — SCOPOLAMINE 1 MG/3DAYS TD PT72: 1.0000 | MEDICATED_PATCH | TRANSDERMAL | 0 refills | Status: DC

## 2020-11-11 NOTE — Addendum Note (Signed)
Addended by: Tonia Ghent on: 11/11/2020 01:47 PM   Modules accepted: Orders

## 2020-11-11 NOTE — Telephone Encounter (Signed)
Vm from pt stating he is going on a cruise.  He's requesting sea sickness med sent to [facility].

## 2020-11-11 NOTE — Telephone Encounter (Signed)
Left message on patients VM that rx was sent and advised to call back if he needs anything else.

## 2020-11-11 NOTE — Telephone Encounter (Signed)
Sent rx for scopolamine.  Hope he has a great trip.

## 2020-11-26 ENCOUNTER — Other Ambulatory Visit: Payer: Self-pay | Admitting: Family Medicine

## 2020-11-27 ENCOUNTER — Encounter: Payer: Self-pay | Admitting: Family Medicine

## 2020-11-30 ENCOUNTER — Other Ambulatory Visit: Payer: Self-pay | Admitting: Family Medicine

## 2020-11-30 MED ORDER — FINASTERIDE 5 MG PO TABS: 5.0000 mg | ORAL_TABLET | Freq: Every day | ORAL | 3 refills | Status: DC

## 2020-12-24 ENCOUNTER — Other Ambulatory Visit: Payer: Self-pay | Admitting: Family Medicine

## 2020-12-27 ENCOUNTER — Telehealth: Payer: Medicare Other | Admitting: Family Medicine

## 2020-12-27 ENCOUNTER — Encounter: Payer: Self-pay | Admitting: Family Medicine

## 2020-12-27 DIAGNOSIS — J069 Acute upper respiratory infection, unspecified: Secondary | ICD-10-CM

## 2020-12-27 MED ORDER — DOXYCYCLINE HYCLATE 100 MG PO TABS: 100.0000 mg | ORAL_TABLET | Freq: Two times a day (BID) | ORAL | 0 refills | Status: AC

## 2020-12-27 MED ORDER — GUAIFENESIN ER 600 MG PO TB12: 600.0000 mg | ORAL_TABLET | Freq: Two times a day (BID) | ORAL | 0 refills | Status: AC

## 2020-12-27 NOTE — Progress Notes (Signed)

## 2020-12-29 ENCOUNTER — Other Ambulatory Visit: Payer: Self-pay | Admitting: Family Medicine

## 2020-12-29 MED ORDER — METOPROLOL SUCCINATE ER 50 MG PO TB24: 50.0000 mg | ORAL_TABLET | Freq: Every day | ORAL | 3 refills | Status: DC

## 2021-01-17 ENCOUNTER — Other Ambulatory Visit: Payer: Self-pay | Admitting: Family Medicine

## 2021-02-05 ENCOUNTER — Other Ambulatory Visit: Payer: Self-pay | Admitting: Family Medicine

## 2021-02-08 NOTE — Telephone Encounter (Signed)
Patient will be due for AWV after 03/29/21; please call to schedule.

## 2021-02-08 NOTE — Telephone Encounter (Signed)
Lvmtcb

## 2021-03-04 ENCOUNTER — Telehealth: Payer: Medicare Other | Admitting: Physician Assistant

## 2021-03-04 DIAGNOSIS — J019 Acute sinusitis, unspecified: Secondary | ICD-10-CM | POA: Diagnosis not present

## 2021-03-04 DIAGNOSIS — B9689 Other specified bacterial agents as the cause of diseases classified elsewhere: Secondary | ICD-10-CM

## 2021-03-04 MED ORDER — AMOXICILLIN-POT CLAVULANATE 875-125 MG PO TABS: 1.0000 | ORAL_TABLET | Freq: Two times a day (BID) | ORAL | 0 refills | Status: DC

## 2021-03-04 NOTE — Progress Notes (Signed)

## 2021-03-28 ENCOUNTER — Telehealth: Payer: Self-pay | Admitting: *Deleted

## 2021-03-28 DIAGNOSIS — I10 Essential (primary) hypertension: Secondary | ICD-10-CM

## 2021-03-28 DIAGNOSIS — Z125 Encounter for screening for malignant neoplasm of prostate: Secondary | ICD-10-CM

## 2021-03-28 NOTE — Telephone Encounter (Signed)
Please place future orders for lab appt.  

## 2021-03-28 NOTE — Progress Notes (Signed)
Subjective:   Dustin [name] is a 65 y.o. male who presents for Medicare Annual/Subsequent preventive examination.  I connected with Dustin [name] today by telephone and verified that I am speaking with the correct person using two identifiers. Location patient: home Location provider: work Persons participating in the virtual visit: patient, Marine scientist.    I discussed the limitations, risks, security and privacy concerns of performing an evaluation and management service by telephone and the availability of in person appointments. I also discussed with the patient that there may be a patient responsible charge related to this service. The patient expressed understanding and verbally consented to this telephonic visit.    Interactive audio and video telecommunications were attempted between this provider and patient, however failed, due to patient having technical difficulties OR patient did not have access to video capability.  We continued and completed visit with audio only.  Some vital signs may be absent or patient reported.   Time Spent with patient on telephone encounter: 20 minutes  Review of Systems     Cardiac Risk Factors include: advanced age (>14men, >43 women);hypertension     Objective:    Today's Vitals   03/29/21 1157  Weight: 278 lb (126.1 kg)  Height: 5\' 8"  (1.727 m)   Body mass index is 42.27 kg/m.  Advanced Directives 03/29/2021 03/24/2020 11/21/2019 02/27/2019 04/04/2018 05/25/2015  Does Patient Have a Medical Advance Directive? No No No No No No  Would patient like information on creating a medical advance directive? Yes (MAU/Ambulatory/Procedural Areas - Information given) No - Patient declined - Yes (MAU/Ambulatory/Procedural Areas - Information given) No - Patient declined No - patient declined information    Current Medications (verified) Outpatient Encounter Medications as of 03/29/2021  Medication Sig   finasteride (PROSCAR) 5 MG tablet Take 1 tablet (5  mg total) by mouth daily.   fluticasone (FLONASE) 50 MCG/ACT nasal spray USE 1-2 SPRAYS IN BOTH NOSTRILS EVERY DAY   hydrochlorothiazide (HYDRODIURIL) 25 MG tablet TAKE 1 TABLET BY MOUTH DAILY   metoprolol succinate (TOPROL-XL) 50 MG 24 hr tablet Take 1 tablet (50 mg total) by mouth daily. Take with or immediately following a meal.   amoxicillin-clavulanate (AUGMENTIN) 875-125 MG tablet Take 1 tablet by mouth 2 (two) times daily. (Patient not taking: Reported on 03/29/2021)   Calcium-Magnesium-Zinc 167-83-8 MG TABS Take by mouth daily.   dextromethorphan 15 MG/5ML syrup Take 5-10 mLs (15-30 mg total) by mouth 4 (four) times daily as needed for cough (sedation caution).   GINSENG PO Take by mouth daily.   Rhodiola rosea (RHODIOLA PO) Take by mouth daily.   scopolamine (TRANSDERM-SCOP, 1.5 MG,) 1 MG/3DAYS Place 1 patch (1.5 mg total) onto the skin every 3 (three) days.   tolnaftate (TINACTIN) 1 % cream Apply 1 application topically 2 (two) times daily.   No facility-administered encounter medications on file as of 03/29/2021.    Allergies (verified) Antihistamines, diphenhydramine-type; Erythromycin; Flomax [tamsulosin]; Guaifenesin & derivatives; and Metoprolol   History: Past Medical History:  Diagnosis Date   Allergy    Hyperlipidemia    Hypertension    Vertigo 06/03/2015   1 episode   Wears hearing aid in both ears    Past Surgical History:  Procedure Laterality Date   ETHMOIDECTOMY Bilateral 02/27/2019   Procedure: ETHMOIDECTOMY;  Surgeon: Margaretha Sheffield, MD;  Location: [place] [facility];  Service: ENT;  Laterality: Bilateral;   FRONTAL SINUS EXPLORATION Bilateral 02/27/2019   Procedure: FRONTAL SINUS EXPLORATION;  Surgeon: Margaretha Sheffield, MD;  Location:  [place];  Service: ENT;  Laterality: Bilateral;   IMAGE GUIDED SINUS SURGERY Bilateral 02/27/2019   Procedure: IMAGE GUIDED SINUS SURGERY;  Surgeon: Margaretha Sheffield, MD;  Location: [facility];  Service: ENT;   Laterality: Bilateral;   LASIK Bilateral 2005   Dr Lucita Ferrara   MAXILLARY ANTROSTOMY Bilateral 02/27/2019   Procedure: MAXILLARY ANTROSTOMY WITH TISSUE REMOVAL;  Surgeon: Margaretha Sheffield, MD;  Location: [facility];  Service: ENT;  Laterality: Bilateral;   SEPTOPLASTY Bilateral 02/27/2019   Procedure: SEPTOPLASTY;  Surgeon: Margaretha Sheffield, MD;  Location: [place];  Service: ENT;  Laterality: Bilateral;  disk put on desk in charge nurse office, 02/06/2019 ds put 2nd disk on charge nurse desk 10-29 kp   TURBINATE REDUCTION Bilateral 02/27/2019   Procedure: TURBINATE REDUCTION;  Surgeon: Margaretha Sheffield, MD;  Location: [place];  Service: ENT;  Laterality: Bilateral;   TYMPANOSTOMY TUBE PLACEMENT     VASECTOMY  04/1995   Family History  Problem Relation Age of Onset   Hyperlipidemia Mother    Hypertension Mother    Coronary artery disease Father    Stroke Father    Cancer Father        prostate   Heart disease Father    Prostate cancer Father    COPD Sister    Diabetes Paternal Uncle    Diabetes Paternal Grandfather    Colon cancer Neg Hx    Social History   Socioeconomic History   Marital status: Married    Spouse name: Not on file   Number of children: 4   Years of education: Not on file   Highest education level: Not on file  Occupational History   Occupation: Reinsurance-Marketing with companies  Tobacco Use   Smoking status: Former    Packs/day: 0.50    Years: 4.00    Pack years: 2.00    Types: Cigarettes    Quit date: 1975    Years since quitting: 47.9   Smokeless tobacco: Never  Vaping Use   Vaping Use: Never used  Substance and Sexual Activity   Alcohol use: Yes    Alcohol/week: 0.0 standard drinks    Comment: occ glass of wine/week   Drug use: No   Sexual activity: Not on file  Other Topics Concern   Not on file  Social History Narrative   Lives with wife, married since 1987, prev widowed   From [place], [place]   4 kids  combined with his 2nd wife   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   Right handed   Social Determinants of Health   Financial Resource Strain: Low Risk    Difficulty of Paying Living Expenses: Not hard at all  Food Insecurity: No Food Insecurity   Worried About Charity fundraiser in the Last Year: Never true   Ran Out of Food in the Last Year: Never true  Transportation Needs: No Transportation Needs   Lack of Transportation (Medical): No   Lack of Transportation (Non-Medical): No  Physical Activity: Inactive   Days of Exercise per Week: 0 days   Minutes of Exercise per Session: 0 min  Stress: No Stress Concern Present   Feeling of Stress : Not at all  Social Connections: Moderately Integrated   Frequency of Communication with Friends and Family: Three times a week   Frequency of Social Gatherings with Friends and Family: More than three times a week   Attends Religious Services: Never   Active Member of  Clubs or Organizations: Yes   Attends Music therapist: More than 4 times per year   Marital Status: Married    Tobacco Counseling Counseling given: Not Answered   Clinical Intake:  Pre-visit preparation completed: Yes  Pain : No/denies pain     BMI - recorded: 42.27 Nutritional Status: BMI > 30  Obese Nutritional Risks: None Diabetes: No  How often do you need to have someone help you when you read instructions, pamphlets, or other written materials from your doctor or pharmacy?: 1 - Never  Diabetic? No  Interpreter Needed?: No  Information entered by :: Orrin Brigham LPN   Activities of Daily Living In your present state of health, do you have any difficulty performing the following activities: 03/29/2021  Hearing? Y  Comment patient has hearing aids  Vision? N  Difficulty concentrating or making decisions? N  Walking or climbing stairs? N  Dressing or bathing? N  Doing errands, shopping? N  Preparing Food and eating ? N   Using the Toilet? N  In the past six months, have you accidently leaked urine? N  Do you have problems with loss of bowel control? N  Managing your Medications? N  Managing your Finances? N  Housekeeping or managing your Housekeeping? N  Some recent data might be hidden    Patient Care Team: Tonia Ghent, MD as PCP - General  Indicate any recent Medical Services you may have received from other than [facility] providers in the past year (date may be approximate).     Assessment:   This is a routine wellness examination for [place].  Hearing/Vision screen Hearing Screening - Comments:: Decrease in hearing patient has hearing aids Vision Screening - Comments:: Last exam 01/2021, [place] issues and exercise activities discussed: Current Exercise Habits: The patient does not participate in regular exercise at present   Goals Addressed             This Visit's Progress    Patient Stated       Would like to eat healthier, drink more water and walk more       Depression Screen PHQ 2/9 Scores 03/29/2021 03/29/2020 03/24/2020 02/18/2018 02/07/2017 02/02/2016  PHQ - 2 Score 0 0 0 0 0 0  PHQ- 9 Score - 0 0 - - -    Fall Risk Fall Risk  03/29/2021 03/29/2020 03/24/2020 11/21/2019 02/02/2016  Falls in the past year? 0 0 0 0 Yes  Number falls in past yr: 0 0 0 0 1  Injury with Fall? 0 0 0 0 -  Risk for fall due to : No Fall Risks - Medication side effect - Other (Comment)  Follow up Falls prevention discussed Falls evaluation completed Falls prevention discussed;Falls evaluation completed - -    FALL RISK PREVENTION PERTAINING TO THE HOME:  Any stairs in or around the home? Yes  If so, are there any without handrails? No  Home free of loose throw rugs in walkways, pet beds, electrical cords, etc? Yes  Adequate lighting in your home to reduce risk of falls? Yes   ASSISTIVE DEVICES UTILIZED TO PREVENT FALLS:  Life alert? No  Use of a cane, walker or w/c? No   Grab bars in the bathroom? No  Shower chair or bench in shower? No  Elevated toilet seat or a handicapped toilet? No   TIMED UP AND GO:  Was the test performed? No , visit completed over the phone.    Cognitive Function:  Normal cognitive status assessed by this Nurse Health Advisor. No abnormalities found.   MMSE - Mini Mental State Exam 03/24/2020  Orientation to time 5  Orientation to Place 5  Registration 3  Attention/ Calculation 5  Recall 3  Language- repeat 1        Immunizations Immunization History  Administered Date(s) Administered   Influenza Whole 02/11/2013   Influenza,inj,Quad PF,6+ Mos 02/09/2015, 02/02/2016, 01/05/2017, 02/18/2018   Influenza,inj,quad, With Preservative 01/25/2019   Influenza-Unspecified 01/14/2014   Moderna Sars-Covid-2 Vaccination 06/18/2019, 07/16/2019, 04/30/2020   Td 09/30/2001, 11/01/2018   Tdap 12/14/2011   Zoster Recombinat (Shingrix) 12/29/2017, 03/23/2018    TDAP status: Up to date  Flu Vaccine status: Due, Education has been provided regarding the importance of this vaccine. Advised may receive this vaccine at local pharmacy or Health Dept. Aware to provide a copy of the vaccination record if obtained from local pharmacy or Health Dept. Verbalized acceptance and understanding.  Pneumococcal vaccine status: Due, Education has been provided regarding the importance of this vaccine. Advised may receive this vaccine at local pharmacy or Health Dept. Aware to provide a copy of the vaccination record if obtained from local pharmacy or Health Dept. Verbalized acceptance and understanding.  Covid-19 vaccine status: Information provided on how to obtain vaccines.   Qualifies for Shingles Vaccine? Yes   Zostavax completed No   Shingrix Completed?: Yes  Screening Tests Health Maintenance  Topic Date Due   COLONOSCOPY (Pts 45-53yrs Insurance coverage will need to be confirmed)  09/02/2015   Pneumonia Vaccine 29+ Years old (1 - PCV)  Never done   COVID-19 Vaccine (4 - Booster for Moderna series) 06/25/2020   INFLUENZA VACCINE  11/15/2020   TETANUS/TDAP  10/31/2028   Hepatitis C Screening  Completed   Zoster Vaccines- Shingrix  Completed   HPV VACCINES  Aged Out    Health Maintenance  Health Maintenance Due  Topic Date Due   COLONOSCOPY (Pts 45-78yrs Insurance coverage will need to be confirmed)  09/02/2015   Pneumonia Vaccine 24+ Years old (1 - PCV) Never done   COVID-19 Vaccine (4 - Booster for Moderna series) 06/25/2020   INFLUENZA VACCINE  11/15/2020    Colorectal Cancer screening: due, last completed 09/02/10, patient plans on discussing with PCP.   Lung Cancer Screening: (Low Dose CT Chest recommended if Age 38-80 years, 30 pack-year currently smoking OR have quit w/in 15years.) does not qualify.    Additional Screening:  Hepatitis C Screening: does qualify; Completed 01/31/16  Vision Screening: Recommended annual ophthalmology exams for early detection of glaucoma and other disorders of the eye. Is the patient up to date with their annual eye exam?  Yes  Who is the provider or what is the name of the office in which the patient attends annual eye exams? [facility]   Dental Screening: Recommended annual dental exams for proper oral hygiene  Community Resource Referral / Chronic Care Management: CRR required this visit?  No   CCM required this visit?  No      Plan:     I have personally reviewed and noted the following in the patient's chart:   Medical and social history Use of alcohol, tobacco or illicit drugs  Current medications and supplements including opioid prescriptions. Patient is not currently taking opioid prescriptions. Functional ability and status Nutritional status Physical activity Advanced directives List of other physicians Hospitalizations, surgeries, and ER visits in previous 12 months Vitals Screenings to include cognitive, depression, and falls Referrals and  appointments  In addition, I have reviewed and discussed with patient certain preventive protocols, quality metrics, and best practice recommendations. A written personalized care plan for preventive services as well as general preventive health recommendations were provided to patient.   Due to this being a telephonic visit, the after visit summary with patients personalized plan was offered to patient via mail or my-chart. Patient would like to access on my-chart.    Loma Messing, LPN   [id number]   Nurse Health Advisor  Nurse Notes: none

## 2021-03-29 ENCOUNTER — Ambulatory Visit (INDEPENDENT_AMBULATORY_CARE_PROVIDER_SITE_OTHER): Payer: Medicare Other

## 2021-03-29 ENCOUNTER — Other Ambulatory Visit (INDEPENDENT_AMBULATORY_CARE_PROVIDER_SITE_OTHER): Payer: Medicare Other

## 2021-03-29 ENCOUNTER — Other Ambulatory Visit: Payer: Self-pay

## 2021-03-29 VITALS — Ht 68.0 in | Wt 278.0 lb

## 2021-03-29 DIAGNOSIS — Z0000 Encounter for general adult medical examination without abnormal findings: Secondary | ICD-10-CM

## 2021-03-29 DIAGNOSIS — I10 Essential (primary) hypertension: Secondary | ICD-10-CM | POA: Diagnosis not present

## 2021-03-29 DIAGNOSIS — Z125 Encounter for screening for malignant neoplasm of prostate: Secondary | ICD-10-CM | POA: Diagnosis not present

## 2021-03-29 LAB — COMPREHENSIVE METABOLIC PANEL
ALT: 15 U/L (ref 0–53)
AST: 12 U/L (ref 0–37)
Albumin: 4.1 g/dL (ref 3.5–5.2)
Alkaline Phosphatase: 72 U/L (ref 39–117)
BUN: 22 mg/dL (ref 6–23)
CO2: 32 mEq/L (ref 19–32)
Calcium: 9.5 mg/dL (ref 8.4–10.5)
Chloride: 98 mEq/L (ref 96–112)
Creatinine, Ser: 0.86 mg/dL (ref 0.40–1.50)
GFR: 89.84 mL/min (ref >60.00)
Glucose, Bld: 98 mg/dL (ref 70–99)
Potassium: 4.2 mEq/L (ref 3.5–5.1)
Sodium: 138 mEq/L (ref 135–145)
Total Bilirubin: 0.5 mg/dL (ref 0.2–1.2)
Total Protein: 6.7 g/dL (ref 6.0–8.3)

## 2021-03-29 LAB — LIPID PANEL
Cholesterol: 179 mg/dL (ref 0–200)
HDL: 45.6 mg/dL (ref >39.00)
LDL Cholesterol: 102 mg/dL — ABNORMAL HIGH (ref 0–99)
NonHDL: 133.57
Total CHOL/HDL Ratio: 4
Triglycerides: 160 mg/dL — ABNORMAL HIGH (ref 0.0–149.0)
VLDL: 32 mg/dL (ref 0.0–40.0)

## 2021-03-29 LAB — PSA, MEDICARE: PSA: 0.25 ng/ml (ref 0.10–4.00)

## 2021-03-29 NOTE — Patient Instructions (Signed)
Mr. Dustin [name] , Thank you for taking time to complete your Medicare Wellness Visit. I appreciate your ongoing commitment to your health goals. Please review the following plan we discussed and let me know if I can assist you in the future.   Screening recommendations/referrals: Colonoscopy: due, last completed 09/02/10, plan to discuss with PCP at your next visit Recommended yearly ophthalmology/optometry visit for glaucoma screening and checkup Recommended yearly dental visit for hygiene and checkup  Vaccinations: Influenza vaccine: Due-May obtain vaccine at our office or your local pharmacy. Pneumococcal vaccine: Due-May obtain vaccine at our office or your local pharmacy. Tdap vaccine: up to date, completed 11/01/18, due 10/31/28 Shingles vaccine: up to date   Covid-19: newest booster available at your local pharmacy  Advanced directives: information available at your next visit  Conditions/risks identified: see problem list  Next appointment: Follow up in one year for your annual wellness visit. 03/30/22 @ 12:00pm, this will be a telephone visit  Preventive Care 67 Years and Older, Male Preventive care refers to lifestyle choices and visits with your health care provider that can promote health and wellness. What does preventive care include? A yearly physical exam. This is also called an annual well check. Dental exams once or twice a year. Routine eye exams. Ask your health care provider how often you should have your eyes checked. Personal lifestyle choices, including: Daily care of your teeth and gums. Regular physical activity. Eating a healthy diet. Avoiding tobacco and drug use. Limiting alcohol use. Practicing safe sex. Taking low doses of aspirin every day. Taking vitamin and mineral supplements as recommended by your health care provider. What happens during an annual well check? The services and screenings done by your health care provider during your annual well check  will depend on your age, overall health, lifestyle risk factors, and family history of disease. Counseling  Your health care provider may ask you questions about your: Alcohol use. Tobacco use. Drug use. Emotional well-being. Home and relationship well-being. Sexual activity. Eating habits. History of falls. Memory and ability to understand (cognition). Work and work Statistician. Screening  You may have the following tests or measurements: Height, weight, and BMI. Blood pressure. Lipid and cholesterol levels. These may be checked every 5 years, or more frequently if you are over 67 years old. Skin check. Lung cancer screening. You may have this screening every year starting at age 67 if you have a 30-pack-year history of smoking and currently smoke or have quit within the past 15 years. Fecal occult blood test (FOBT) of the stool. You may have this test every year starting at age 67. Flexible sigmoidoscopy or colonoscopy. You may have a sigmoidoscopy every 5 years or a colonoscopy every 10 years starting at age 67. Prostate cancer screening. Recommendations will vary depending on your family history and other risks. Hepatitis C blood test. Hepatitis B blood test. Sexually transmitted disease (STD) testing. Diabetes screening. This is done by checking your blood sugar (glucose) after you have not eaten for a while (fasting). You may have this done every 1-3 years. Abdominal aortic aneurysm (AAA) screening. You may need this if you are a current or former smoker. Osteoporosis. You may be screened starting at age 67 if you are at high risk. Talk with your health care provider about your test results, treatment options, and if necessary, the need for more tests. Vaccines  Your health care provider may recommend certain vaccines, such as: Influenza vaccine. This is recommended every year. Tetanus, diphtheria, and  acellular pertussis (Tdap, Td) vaccine. You may need a Td booster every 10  years. Zoster vaccine. You may need this after age 12. Pneumococcal 13-valent conjugate (PCV13) vaccine. One dose is recommended after age 3. Pneumococcal polysaccharide (PPSV23) vaccine. One dose is recommended after age 56. Talk to your health care provider about which screenings and vaccines you need and how often you need them. This information is not intended to replace advice given to you by your health care provider. Make sure you discuss any questions you have with your health care provider. Document Released: 04/30/2015 Document Revised: 12/22/2015 Document Reviewed: 02/02/2015 Elsevier Interactive Patient Education  2017 [place] Prevention in the Home Falls can cause injuries. They can happen to people of all ages. There are many things you can do to make your home safe and to help prevent falls. What can I do on the outside of my home? Regularly fix the edges of walkways and driveways and fix any cracks. Remove anything that might make you trip as you walk through a door, such as a raised step or threshold. Trim any bushes or trees on the path to your home. Use bright outdoor lighting. Clear any walking paths of anything that might make someone trip, such as rocks or tools. Regularly check to see if handrails are loose or broken. Make sure that both sides of any steps have handrails. Any raised decks and porches should have guardrails on the edges. Have any leaves, snow, or ice cleared regularly. Use sand or salt on walking paths during winter. Clean up any spills in your garage right away. This includes oil or grease spills. What can I do in the bathroom? Use night lights. Install grab bars by the toilet and in the tub and shower. Do not use towel bars as grab bars. Use non-skid mats or decals in the tub or shower. If you need to sit down in the shower, use a plastic, non-slip stool. Keep the floor dry. Clean up any water that spills on the floor as soon as it  happens. Remove soap buildup in the tub or shower regularly. Attach bath mats securely with double-sided non-slip rug tape. Do not have throw rugs and other things on the floor that can make you trip. What can I do in the bedroom? Use night lights. Make sure that you have a light by your bed that is easy to reach. Do not use any sheets or blankets that are too big for your bed. They should not hang down onto the floor. Have a firm chair that has side arms. You can use this for support while you get dressed. Do not have throw rugs and other things on the floor that can make you trip. What can I do in the kitchen? Clean up any spills right away. Avoid walking on wet floors. Keep items that you use a lot in easy-to-reach places. If you need to reach something above you, use a strong step stool that has a grab bar. Keep electrical cords out of the way. Do not use floor polish or wax that makes floors slippery. If you must use wax, use non-skid floor wax. Do not have throw rugs and other things on the floor that can make you trip. What can I do with my stairs? Do not leave any items on the stairs. Make sure that there are handrails on both sides of the stairs and use them. Fix handrails that are broken or loose. Make sure that  handrails are as long as the stairways. Check any carpeting to make sure that it is firmly attached to the stairs. Fix any carpet that is loose or worn. Avoid having throw rugs at the top or bottom of the stairs. If you do have throw rugs, attach them to the floor with carpet tape. Make sure that you have a light switch at the top of the stairs and the bottom of the stairs. If you do not have them, ask someone to add them for you. What else can I do to help prevent falls? Wear shoes that: Do not have high heels. Have rubber bottoms. Are comfortable and fit you well. Are closed at the toe. Do not wear sandals. If you use a stepladder: Make sure that it is fully opened.  Do not climb a closed stepladder. Make sure that both sides of the stepladder are locked into place. Ask someone to hold it for you, if possible. Clearly mark and make sure that you can see: Any grab bars or handrails. First and last steps. Where the edge of each step is. Use tools that help you move around (mobility aids) if they are needed. These include: Canes. Walkers. Scooters. Crutches. Turn on the lights when you go into a dark area. Replace any light bulbs as soon as they burn out. Set up your furniture so you have a clear path. Avoid moving your furniture around. If any of your floors are uneven, fix them. If there are any pets around you, be aware of where they are. Review your medicines with your doctor. Some medicines can make you feel dizzy. This can increase your chance of falling. Ask your doctor what other things that you can do to help prevent falls. This information is not intended to replace advice given to you by your health care provider. Make sure you discuss any questions you have with your health care provider. Document Released: 01/28/2009 Document Revised: 09/09/2015 Document Reviewed: 05/08/2014 Elsevier Interactive Patient Education  2017 [employer].

## 2021-03-29 NOTE — Addendum Note (Signed)
Addended by: Leeanne Rio on: 03/29/2021 08:34 AM   Modules accepted: Orders

## 2021-03-29 NOTE — Telephone Encounter (Signed)
Done. Thanks.

## 2021-03-29 NOTE — Telephone Encounter (Signed)
Can't access orders, had to change them to future.

## 2021-04-04 ENCOUNTER — Telehealth: Payer: Medicare Other | Admitting: Physician Assistant

## 2021-04-04 DIAGNOSIS — J019 Acute sinusitis, unspecified: Secondary | ICD-10-CM

## 2021-04-04 DIAGNOSIS — B9789 Other viral agents as the cause of diseases classified elsewhere: Secondary | ICD-10-CM | POA: Diagnosis not present

## 2021-04-04 MED ORDER — IPRATROPIUM BROMIDE 0.03 % NA SOLN: 2.0000 | Freq: Two times a day (BID) | NASAL | 0 refills | Status: DC

## 2021-04-04 NOTE — Progress Notes (Signed)

## 2021-04-06 ENCOUNTER — Telehealth: Payer: Self-pay | Admitting: Family Medicine

## 2021-04-06 NOTE — Telephone Encounter (Signed)
Noted. Thanks.

## 2021-04-06 NOTE — Telephone Encounter (Signed)
Patient has physical tomorrow. He has some questions about his symptoms that he would like to discuss with you at that time.

## 2021-04-06 NOTE — Telephone Encounter (Signed)
Please get update on patient and let me know if he is not improving. Thanks.

## 2021-04-07 ENCOUNTER — Ambulatory Visit (INDEPENDENT_AMBULATORY_CARE_PROVIDER_SITE_OTHER): Payer: Managed Care, Other (non HMO) | Admitting: Family Medicine

## 2021-04-07 ENCOUNTER — Encounter: Payer: Self-pay | Admitting: Family Medicine

## 2021-04-07 ENCOUNTER — Other Ambulatory Visit: Payer: Self-pay

## 2021-04-07 VITALS — BP 122/82 | HR 74 | Temp 98.0°F | Ht 68.0 in | Wt 286.0 lb

## 2021-04-07 DIAGNOSIS — M25529 Pain in unspecified elbow: Secondary | ICD-10-CM

## 2021-04-07 DIAGNOSIS — N529 Male erectile dysfunction, unspecified: Secondary | ICD-10-CM

## 2021-04-07 DIAGNOSIS — I10 Essential (primary) hypertension: Secondary | ICD-10-CM

## 2021-04-07 DIAGNOSIS — Z0000 Encounter for general adult medical examination without abnormal findings: Secondary | ICD-10-CM

## 2021-04-07 DIAGNOSIS — B9789 Other viral agents as the cause of diseases classified elsewhere: Secondary | ICD-10-CM

## 2021-04-07 DIAGNOSIS — Z7189 Other specified counseling: Secondary | ICD-10-CM

## 2021-04-07 DIAGNOSIS — Z23 Encounter for immunization: Secondary | ICD-10-CM

## 2021-04-07 MED ORDER — SILDENAFIL CITRATE 20 MG PO TABS
20.0000 mg | ORAL_TABLET | Freq: Every day | ORAL | 12 refills | Status: DC | PRN
Start: 2021-04-07 — End: 2021-05-09

## 2021-04-07 MED ORDER — HYDROCHLOROTHIAZIDE 25 MG PO TABS: 25.0000 mg | ORAL_TABLET | Freq: Every day | ORAL | 3 refills | Status: DC

## 2021-04-07 MED ORDER — IPRATROPIUM BROMIDE 0.03 % NA SOLN: 2.0000 | Freq: Two times a day (BID) | NASAL | 3 refills | Status: DC

## 2021-04-07 NOTE — Patient Instructions (Signed)
Try sildenafil as needed.  Tennis elbow strap and ice for 5 minutes at a time.   Take care.  Glad to see you.

## 2021-04-07 NOTE — Progress Notes (Signed)
This visit occurred during the SARS-CoV-2 public health emergency.  Safety protocols were in place, including screening questions prior to the visit, additional usage of staff PPE, and extensive cleaning of exam room while observing appropriate contact time as indicated for disinfecting solutions.  Hypertension:    Using medication without problems or lightheadedness: yes Chest pain with exertion:no Edema:no Short of breath:no Labs d/w pt.    He cut out drinking coffee.    Tetanus 2013 Flu 2022 PNA 2021 Shingles prev done.   covid vaccine 2021 HIV and HCV neg prev.   PSA wnl.  Dw pt.  FH noted.   Colonoscopy due.  D/w pt.  He'll call to schedule.   Living will d/w pt.  Wife designated if patient were incapacitated.    Diet and exercise d/w pt.  Encouraged both, discussed. AAA screening done 2021.    He had back pain that responded to a muscle relaxer and didn't return.    ED d/w pt.  Routine cautions re: sildenafil d/w pt.  Reasonable to try.    L elbow pain, better with ibuprofen.  Prev once a week now more common.    Meds, vitals, and allergies reviewed.   PMH and SH reviewed  ROS: Per HPI unless specifically indicated in ROS section   GEN: nad, alert and oriented HEENT: mucous membranes moist NECK: supple w/o LA CV: rrr. PULM: ctab, no inc wob ABD: soft, +bs EXT: no edema SKIN: no acute rash L elbow exam c/w tennis elbow- ttp at the lateral epicondyle, improved with compression at the forearm.   33 minutes were devoted to patient care in this encounter (this includes time spent reviewing the patient's file/history, interviewing and examining the patient, counseling/reviewing plan with patient).

## 2021-04-09 ENCOUNTER — Telehealth: Payer: Medicare Other | Admitting: Nurse Practitioner

## 2021-04-09 DIAGNOSIS — J069 Acute upper respiratory infection, unspecified: Secondary | ICD-10-CM

## 2021-04-09 NOTE — Progress Notes (Signed)
E-Visit for Sinus Problems  We are sorry that you are not feeling well.  Here is how we plan to help!  Based on what you have shared with me it looks like you have sinusitis.  Sinusitis is inflammation and infection in the sinus cavities of the head.  Based on your presentation I believe you most likely have Acute Viral Sinusitis.This is an infection most likely caused by a virus.   You may consider taking another home COVID-19 test to assure your acute symptoms are not from [place]. If you test positive we encourage you to schedule a follow up visit.   There is not specific treatment for viral sinusitis other than to help you with the symptoms until the infection runs its course.  You may use an oral decongestant such as Mucinex D or if you have glaucoma or high blood pressure use plain Mucinex. Saline nasal spray help and can safely be used as often as needed for congestion. You should also continue to use your prescription nasal spray.   Bacterial sinus infections are not typical before 7+ days of nasal congestion. Providers prescribe antibiotics to treat infections caused by bacteria. Antibiotics are very powerful in treating bacterial infections when they are used properly. To maintain their effectiveness, they should be used only when necessary. Overuse of antibiotics has resulted in the development of superbugs that are resistant to treatment!    After careful review of your answers, I would not recommend an antibiotic for your condition.  Antibiotics are not effective against viruses and therefore should not be used to treat them. Common examples of infections caused by viruses include colds and flu   Some authorities believe that zinc sprays or the use of Echinacea may shorten the course of your symptoms.  Sinus infections are not as easily transmitted as other respiratory infection, however we still recommend that you avoid close contact with loved ones, especially the very young and elderly.   Remember to wash your hands thoroughly throughout the day as this is the number one way to prevent the spread of infection!  Home Care: Only take medications as instructed by your medical team. Do not take these medications with alcohol. A steam or ultrasonic humidifier can help congestion.  You can place a towel over your head and breathe in the steam from hot water coming from a faucet. Avoid close contacts especially the very young and the elderly. Cover your mouth when you cough or sneeze. Always remember to wash your hands.  Get Help Right Away If: You develop worsening fever or sinus pain. You develop a severe head ache or visual changes. Your symptoms persist after you have completed your treatment plan.  Make sure you Understand these instructions. Will watch your condition. Will get help right away if you are not doing well or get worse.   Thank you for choosing an e-visit.  Your e-visit answers were reviewed by a board certified advanced clinical practitioner to complete your personal care plan. Depending upon the condition, your plan could have included both over the counter or prescription medications.  Please review your pharmacy choice. Make sure the pharmacy is open so you can pick up prescription now. If there is a problem, you may contact your provider through [employer] and have the prescription routed to another pharmacy.  Your safety is important to [place]. If you have drug allergies check your prescription carefully.   For the next 24 hours you can use MyChart to ask questions  about today's visit, request a non-urgent call back, or ask for a work or school excuse. You will get an email in the next two days asking about your experience. I hope that your e-visit has been valuable and will speed your recovery.  I spent approximately 7 minutes reviewing the patient's history, current symptoms and coordinating their plan of care today.

## 2021-04-13 DIAGNOSIS — N529 Male erectile dysfunction, unspecified: Secondary | ICD-10-CM | POA: Insufficient documentation

## 2021-04-13 DIAGNOSIS — M25529 Pain in unspecified elbow: Secondary | ICD-10-CM | POA: Insufficient documentation

## 2021-04-13 NOTE — Assessment & Plan Note (Signed)
Tetanus 2013 Flu 2022 PNA 2021 Shingles prev done.   covid vaccine 2021 HIV and HCV neg prev.   PSA wnl.  Dw pt.  FH noted.   Colonoscopy due.  D/w pt.  He'll call to schedule.   Living will d/w pt.  Wife designated if patient were incapacitated.    Diet and exercise d/w pt.  Encouraged both, discussed. AAA screening done 2021.

## 2021-04-13 NOTE — Assessment & Plan Note (Signed)
Living will d/w pt.  Wife designated if patient were incapacitated.   ?

## 2021-04-13 NOTE — Assessment & Plan Note (Signed)
Likely tennis elbow.  Discussed icing and using a tennis elbow strap and he can update me as needed.

## 2021-04-13 NOTE — Assessment & Plan Note (Signed)
Continue hydrochlorothiazide metoprolol

## 2021-04-13 NOTE — Assessment & Plan Note (Signed)
Reasonable to trial sildenafil routine cautions given to patient.

## 2021-05-02 ENCOUNTER — Telehealth: Payer: Medicare Other | Admitting: Physician Assistant

## 2021-05-02 DIAGNOSIS — J329 Chronic sinusitis, unspecified: Secondary | ICD-10-CM

## 2021-05-02 MED ORDER — AMOXICILLIN-POT CLAVULANATE 875-125 MG PO TABS: 1.0000 | ORAL_TABLET | Freq: Two times a day (BID) | ORAL | 0 refills | Status: DC

## 2021-05-02 MED ORDER — BENZONATATE 100 MG PO CAPS: 100.0000 mg | ORAL_CAPSULE | Freq: Three times a day (TID) | ORAL | 0 refills | Status: AC

## 2021-05-02 NOTE — Progress Notes (Signed)
E-Visit for Sinus Problems  We are sorry that you are not feeling well.  Here is how we plan to help!  Based on what you have shared with me it looks like you have sinusitis.  Sinusitis is inflammation and infection in the sinus cavities of the head.  Based on your presentation I believe you most likely have Acute Bacterial Sinusitis.  This is an infection caused by bacteria and is treated with antibiotics. I have prescribed Augmentin 875mg /125mg  one tablet twice daily with food, for 7 days. I have also prescribed a cough medication to take for your cough. You may use an oral decongestant such as Mucinex D or if you have glaucoma or high blood pressure use plain Mucinex. Saline nasal spray help and can safely be used as often as needed for congestion.  If you develop worsening sinus pain, fever or notice severe headache and vision changes, or if symptoms are not better after completion of antibiotic, please schedule an appointment with a health care provider.    Sinus infections are not as easily transmitted as other respiratory infection, however we still recommend that you avoid close contact with loved ones, especially the very young and elderly.  Remember to wash your hands thoroughly throughout the day as this is the number one way to prevent the spread of infection!  Home Care: Only take medications as instructed by your medical team. Complete the entire course of an antibiotic. Do not take these medications with alcohol. A steam or ultrasonic humidifier can help congestion.  You can place a towel over your head and breathe in the steam from hot water coming from a faucet. Avoid close contacts especially the very young and the elderly. Cover your mouth when you cough or sneeze. Always remember to wash your hands.  Get Help Right Away If: You develop worsening fever or sinus pain. You develop a severe head ache or visual changes. Your symptoms persist after you have completed your treatment  plan.  Make sure you Understand these instructions. Will watch your condition. Will get help right away if you are not doing well or get worse.  Thank you for choosing an e-visit.  Your e-visit answers were reviewed by a board certified advanced clinical practitioner to complete your personal care plan. Depending upon the condition, your plan could have included both over the counter or prescription medications.  Please review your pharmacy choice. Make sure the pharmacy is open so you can pick up prescription now. If there is a problem, you may contact your provider through [employer] and have the prescription routed to another pharmacy.  Your safety is important to [place]. If you have drug allergies check your prescription carefully.   For the next 24 hours you can use MyChart to ask questions about today's visit, request a non-urgent call back, or ask for a work or school excuse. You will get an email in the next two days asking about your experience. I hope that your e-visit has been valuable and will speed your recovery.  Approximately 5 minutes was spent documenting and reviewing patient's chart.

## 2021-05-05 ENCOUNTER — Encounter: Payer: Self-pay | Admitting: Family Medicine

## 2021-05-09 ENCOUNTER — Other Ambulatory Visit: Payer: Self-pay | Admitting: Family Medicine

## 2021-05-09 MED ORDER — TADALAFIL 5 MG PO TABS
2.5000 mg | ORAL_TABLET | Freq: Every day | ORAL | 11 refills | Status: DC
Start: 2021-05-09 — End: 2022-05-17

## 2021-06-14 ENCOUNTER — Telehealth: Payer: Medicare Other | Admitting: Physician Assistant

## 2021-06-14 DIAGNOSIS — J019 Acute sinusitis, unspecified: Secondary | ICD-10-CM

## 2021-06-14 DIAGNOSIS — B9689 Other specified bacterial agents as the cause of diseases classified elsewhere: Secondary | ICD-10-CM

## 2021-06-14 MED ORDER — AMOXICILLIN-POT CLAVULANATE 875-125 MG PO TABS: 1.0000 | ORAL_TABLET | Freq: Two times a day (BID) | ORAL | 0 refills | Status: DC

## 2021-06-14 NOTE — Progress Notes (Signed)
I have spent 5 minutes in review of e-visit questionnaire, review and updating patient chart, medical decision making and response to patient.   Kolden Cody Jayliah Benett, PA-C    

## 2021-06-14 NOTE — Progress Notes (Signed)

## 2021-07-01 ENCOUNTER — Encounter: Payer: Self-pay | Admitting: Family Medicine

## 2021-07-03 ENCOUNTER — Other Ambulatory Visit: Payer: Self-pay | Admitting: Family Medicine

## 2021-07-03 DIAGNOSIS — Z1211 Encounter for screening for malignant neoplasm of colon: Secondary | ICD-10-CM

## 2021-10-14 ENCOUNTER — Telehealth: Payer: Medicare Other | Admitting: Physician Assistant

## 2021-10-14 DIAGNOSIS — J208 Acute bronchitis due to other specified organisms: Secondary | ICD-10-CM

## 2021-10-14 MED ORDER — BENZONATATE 100 MG PO CAPS: 100.0000 mg | ORAL_CAPSULE | Freq: Three times a day (TID) | ORAL | 0 refills | Status: DC | PRN

## 2021-10-14 MED ORDER — PREDNISONE 10 MG (21) PO TBPK: ORAL_TABLET | ORAL | 0 refills | Status: DC

## 2021-10-14 NOTE — Progress Notes (Signed)
We are sorry that you are not feeling well.  Here is how we plan to help! ? ?Based on your presentation I believe you most likely have A cough due to a virus.  This is called viral bronchitis and is best treated by rest, plenty of fluids and control of the cough.  You may use Ibuprofen or Tylenol as directed to help your symptoms.   ?  ?In addition you may use A non-prescription cough medication called Mucinex DM: take 2 tablets every 12 hours. and A prescription cough medication called Tessalon Perles 100mg. You may take 1-2 capsules every 8 hours as needed for your cough. ? ?Prednisone 10 mg daily for 6 days (see taper instructions below) ? ?Directions for 6 day taper: ?Day 1: 2 tablets before breakfast, 1 after both lunch & dinner and 2 at bedtime ?Day 2: 1 tab before breakfast, 1 after both lunch & dinner and 2 at bedtime ?Day 3: 1 tab at each meal & 1 at bedtime ?Day 4: 1 tab at breakfast, 1 at lunch, 1 at bedtime ?Day 5: 1 tab at breakfast & 1 tab at bedtime ?Day 6: 1 tab at breakfast ? ?From your responses in the eVisit questionnaire you describe inflammation in the upper respiratory tract which is causing a significant cough.  This is commonly called Bronchitis and has four common causes:   ?Allergies ?Viral Infections ?Acid Reflux ?Bacterial Infection ?Allergies, viruses and acid reflux are treated by controlling symptoms or eliminating the cause. An example might be a cough caused by taking certain blood pressure medications. You stop the cough by changing the medication. Another example might be a cough caused by acid reflux. Controlling the reflux helps control the cough. ? ?USE OF BRONCHODILATOR ("RESCUE") INHALERS: ?There is a risk from using your bronchodilator too frequently.  The risk is that over-reliance on a medication which only relaxes the muscles surrounding the breathing tubes can reduce the effectiveness of medications prescribed to reduce swelling and congestion of the tubes themselves.   Although you feel brief relief from the bronchodilator inhaler, your asthma may actually be worsening with the tubes becoming more swollen and filled with mucus.  This can delay other crucial treatments, such as oral steroid medications. If you need to use a bronchodilator inhaler daily, several times per day, you should discuss this with your provider.  There are probably better treatments that could be used to keep your asthma under control.  ?   ?HOME CARE ?Only take medications as instructed by your medical team. ?Complete the entire course of an antibiotic. ?Drink plenty of fluids and get plenty of rest. ?Avoid close contacts especially the very young and the elderly ?Cover your mouth if you cough or cough into your sleeve. ?Always remember to wash your hands ?A steam or ultrasonic humidifier can help congestion.  ? ?GET HELP RIGHT AWAY IF: ?You develop worsening fever. ?You become short of breath ?You cough up blood. ?Your symptoms persist after you have completed your treatment plan ?MAKE SURE YOU  ?Understand these instructions. ?Will watch your condition. ?Will get help right away if you are not doing well or get worse. ?  ? ?Thank you for choosing an e-visit. ? ?Your e-visit answers were reviewed by a board certified advanced clinical practitioner to complete your personal care plan. Depending upon the condition, your plan could have included both over the counter or prescription medications. ? ?Please review your pharmacy choice. Make sure the pharmacy is open so you can   pick up prescription now. If there is a problem, you may contact your provider through MyChart messaging and have the prescription routed to another pharmacy.  Your safety is important to us. If you have drug allergies check your prescription carefully.  ? ?For the next 24 hours you can use MyChart to ask questions about today's visit, request a non-urgent call back, or ask for a work or school excuse. ?You will get an email in the next  two days asking about your experience. I hope that your e-visit has been valuable and will speed your recovery. ? ?I provided 5 minutes of non face-to-face time during this encounter for chart review and documentation.  ? ?

## 2021-10-18 ENCOUNTER — Telehealth: Payer: Medicare Other | Admitting: Physician Assistant

## 2021-10-18 DIAGNOSIS — J069 Acute upper respiratory infection, unspecified: Secondary | ICD-10-CM

## 2021-10-18 NOTE — Progress Notes (Signed)
Sent for in-person assessment with PCP giving continued symptoms despite treatment.

## 2021-11-10 ENCOUNTER — Telehealth: Payer: Self-pay | Admitting: Family Medicine

## 2021-11-10 DIAGNOSIS — G4730 Sleep apnea, unspecified: Secondary | ICD-10-CM

## 2021-11-10 NOTE — Telephone Encounter (Signed)
Patient called back. Would like for you to return his call.

## 2021-11-10 NOTE — Telephone Encounter (Signed)
Called and spoke with patient he is will to go to pulmonary to have sleep study done, pt said that he will go to which office [place] /[place] that you recommend.

## 2021-11-10 NOTE — Telephone Encounter (Signed)
I think he is likely going to need a new sleep study to get this covered.  The best way to get that done is to go through pulmonary.  I can put in the referral to get that set up.  I pended it below.  Thanks.

## 2021-11-10 NOTE — Telephone Encounter (Signed)
I left a message for the patient to return my call.

## 2021-11-10 NOTE — Telephone Encounter (Signed)
Patient called and said that he had a sleep study a couple of years ago and the put him on a cpap machine and he said he had discarded it because it was not working. He said that he really has to do something about his sleep apnea and needs one that will work but wasn't sure how he needs to go about getting one. Call back is [phone]

## 2021-11-11 NOTE — Telephone Encounter (Signed)
I put in the referral to [place].  They both do a great job but that should be closer for patient.  Thanks.

## 2021-11-22 ENCOUNTER — Ambulatory Visit (INDEPENDENT_AMBULATORY_CARE_PROVIDER_SITE_OTHER): Payer: Managed Care, Other (non HMO) | Admitting: Adult Health

## 2021-11-22 ENCOUNTER — Encounter: Payer: Self-pay | Admitting: Adult Health

## 2021-11-22 DIAGNOSIS — G4733 Obstructive sleep apnea (adult) (pediatric): Secondary | ICD-10-CM | POA: Diagnosis not present

## 2021-11-22 DIAGNOSIS — J309 Allergic rhinitis, unspecified: Secondary | ICD-10-CM | POA: Diagnosis not present

## 2021-11-22 NOTE — Patient Instructions (Signed)
Set up for home sleep study  Work on healthy sleep regimen  Work on healthy weight  Do not drive if sleepy  Follow up in 2 months to discuss study results and treatment plan.

## 2021-11-22 NOTE — Assessment & Plan Note (Signed)
Improved control on current regimen

## 2021-11-22 NOTE — Assessment & Plan Note (Signed)
Previous dx of OSA , snoring, daytime sleepiness , BMI 42, restless sleep and witnessed apneic events all concerning for ongoing OSA - set up for Home sleep study   - discussed how weight can impact sleep and risk for sleep disordered breathing - discussed options to assist with weight loss: combination of diet modification, cardiovascular and strength training exercises   - had an extensive discussion regarding the adverse health consequences related to untreated sleep disordered breathing - specifically discussed the risks for hypertension, coronary artery disease, cardiac dysrhythmias, cerebrovascular disease, and diabetes - lifestyle modification discussed   - discussed how sleep disruption can increase risk of accidents, particularly when driving - safe driving practices were discussed   Plan  Patient Instructions  Set up for home sleep study  Work on healthy sleep regimen  Work on healthy weight  Do not drive if sleepy  Follow up in 2 months to discuss study results and treatment plan.

## 2021-11-22 NOTE — Progress Notes (Signed)
$'@Patient'H$  ID: Dustin [name], male    DOB: 08/11/1955, 66 y.o.   MRN: [id number]  Chief Complaint  Patient presents with   Follow-up    Referring provider: Tonia Ghent, MD  HPI: 66 year old male seen previously for sleep apnea and chronic cough  TEST/EVENTS :  Sleep Study 04/04/18 Mild obstructive sleep apnea with an AHI of 6.5 and SpO2 low of 80%.  FENO 07/30/88-21  11/22/2021 Follow up : OSA  Patient presents for follow-up visit.  He was last seen January 2023.  Patient at that time was having some snoring and daytime sleepiness.  He was set up for home sleep study that was completed on April 04, 2018.  Patient says he only slept for couple hours at night.  Sleep study showed mild sleep apnea with AHI at 6.5/hour and SpO2 low at 80%.  Patient was recommended to begin CPAP therapy.  Says he wore for 2-3 months but was very uncomfortable. Was told CPAP was under recall, not sure where it is.  [facility]   Patient returns today because of ongoing symptoms.  He has significant snoring that is disruptive to his wife  Also his wife reports episodes where he stops breathing.  Having to sleep in separate bedroom due to snoring. He typically goes to bed about 10 PM.  Takes about 30 minutes to go to sleep.  Is up about 6 AM.  Patient is up multiple times throughout the night and is restless. Has BPH with frequent nocturia .  Epworth score is 8 out of 24.  Typically gets sleepy in the evening hours or if he is riding in a car for long period of time. No history of CHF or CVA. Has removable implant with plans for permanent implant.  No sleep aides. Caffeine intake: 2 cups of coffee. No pain meds .   Previously had chronic cough. Improved/resolved since starting on allergy shots and sinus surgery .  Minimal cough on occasion.    Allergies  Allergen Reactions   Antihistamines, Diphenhydramine-Type Other (See Comments)    Insomnia with benadryl, but able to tolerate claritin    Erythromycin     REACTION: GI UPSET   Flomax [Tamsulosin]     headache   Guaifenesin & Derivatives Other (See Comments)    Irritable but able to tolerate med if needed.    Metoprolol Other (See Comments)    Max tolerated dose '50mg'$  day, not an allergy    Immunization History  Administered Date(s) Administered   Fluad Quad(high Dose 65+) 04/07/2021   Influenza Whole 02/11/2013   Influenza,inj,Quad PF,6+ Mos 02/09/2015, 02/02/2016, 01/05/2017, 02/18/2018   Influenza,inj,quad, With Preservative 01/25/2019   Influenza-Unspecified 01/14/2014   Moderna Sars-Covid-2 Vaccination 06/18/2019, 07/16/2019, 04/30/2020   Td 09/30/2001, 11/01/2018   Tdap 12/14/2011   Zoster Recombinat (Shingrix) 12/29/2017, 03/23/2018    Past Medical History:  Diagnosis Date   Allergy    Hyperlipidemia    Hypertension    Vertigo 06/03/2015   1 episode   Wears hearing aid in both ears     Tobacco History: Social History   Tobacco Use  Smoking Status Former   Packs/day: 0.50   Years: 4.00   Total pack years: 2.00   Types: Cigarettes   Quit date: 1975   Years since quitting: 48.6  Smokeless Tobacco Never   Counseling given: Not Answered   Outpatient Medications Prior to Visit  Medication Sig Dispense Refill   fluticasone (FLONASE) 50 MCG/ACT nasal spray USE 1-2 SPRAYS  IN BOTH NOSTRILS EVERY DAY 16 g 12   ipratropium (ATROVENT) 0.03 % nasal spray Place 2 sprays into both nostrils every 12 (twelve) hours. 30 mL 3   tadalafil (CIALIS) 5 MG tablet Take 0.5-1 tablets (2.5-5 mg total) by mouth daily. 30 tablet 11   benzonatate (TESSALON) 100 MG capsule Take 1 capsule (100 mg total) by mouth 3 (three) times daily as needed. (Patient not taking: Reported on 11/22/2021) 30 capsule 0   hydrochlorothiazide (HYDRODIURIL) 25 MG tablet Take 1 tablet (25 mg total) by mouth daily. 90 tablet 3   metoprolol succinate (TOPROL-XL) 50 MG 24 hr tablet Take 1 tablet (50 mg total) by mouth daily. Take with or immediately  following a meal. 90 tablet 3   predniSONE (STERAPRED UNI-PAK 21 TAB) 10 MG (21) TBPK tablet 6 day taper; take as directed on package instructions (Patient not taking: Reported on 11/22/2021) 21 tablet 0   finasteride (PROSCAR) 5 MG tablet Take 1 tablet (5 mg total) by mouth daily. 90 tablet 3   No facility-administered medications prior to visit.     Review of Systems:   Constitutional:   No  weight loss, night sweats,  Fevers, chills, fatigue, or  lassitude.  HEENT:   No headaches,  Difficulty swallowing,  Tooth/dental problems, or  Sore throat,                No sneezing, itching, ear ache,  +nasal congestion, post nasal drip,   CV:  No chest pain,  Orthopnea, PND, swelling in lower extremities, anasarca, dizziness, palpitations, syncope.   GI  No heartburn, indigestion, abdominal pain, nausea, vomiting, diarrhea, change in bowel habits, loss of appetite, bloody stools.   Resp: No shortness of breath with exertion or at rest.  No excess mucus, no productive cough,  No non-productive cough,  No coughing up of blood.  No change in color of mucus.  No wheezing.  No chest wall deformity  Skin: no rash or lesions.  GU: no dysuria, change in color of urine, no urgency or frequency.  No flank pain, no hematuria   MS:  No joint pain or swelling.  No decreased range of motion.  No back pain.    Physical Exam  BP 110/60 (BP Location: Left Arm)   Pulse 67   Ht '5\' 8"'$  (1.727 m)   Wt 282 lb 9.6 oz (128.2 kg)   SpO2 92%   BMI 42.97 kg/m   GEN: A/Ox3; pleasant , NAD, well nourished    HEENT:  [place]/AT,   NOSE-clear, THROAT-clear, no lesions, no postnasal drip or exudate noted. Class 3 MP airway Class 3 MP airway   NECK:  Supple w/ fair ROM; no JVD; normal carotid impulses w/o bruits; no thyromegaly or nodules palpated; no lymphadenopathy.    RESP  Clear  P & A; w/o, wheezes/ rales/ or rhonchi. no accessory muscle use, no dullness to percussion  CARD:  RRR, no m/r/g, no peripheral edema,  pulses intact, no cyanosis or clubbing.  GI:   Soft & nt; nml bowel sounds; no organomegaly or masses detected.   Musco: Warm bil, no deformities or joint swelling noted.   Neuro: alert, no focal deficits noted.    Skin: Warm, no lesions or rashes    Lab Results:   BNP No results found for: "BNP"  ProBNP No results found for: "PROBNP"  Imaging: No results found.        No data to display  Lab Results  Component Value Date   NITRICOXIDE 21 07/30/2017        Assessment & Plan:   OSA (obstructive sleep apnea) Previous dx of OSA , snoring, daytime sleepiness , BMI 42, restless sleep and witnessed apneic events all concerning for ongoing OSA - set up for Home sleep study   - discussed how weight can impact sleep and risk for sleep disordered breathing - discussed options to assist with weight loss: combination of diet modification, cardiovascular and strength training exercises   - had an extensive discussion regarding the adverse health consequences related to untreated sleep disordered breathing - specifically discussed the risks for hypertension, coronary artery disease, cardiac dysrhythmias, cerebrovascular disease, and diabetes - lifestyle modification discussed   - discussed how sleep disruption can increase risk of accidents, particularly when driving - safe driving practices were discussed   Plan  Patient Instructions  Set up for home sleep study  Work on healthy sleep regimen  Work on healthy weight  Do not drive if sleepy  Follow up in 2 months to discuss study results and treatment plan.       Allergic rhinitis Improved control on current regimen      Rexene Edison, NP 11/22/2021

## 2021-11-24 ENCOUNTER — Institutional Professional Consult (permissible substitution): Payer: Medicare Other | Admitting: Adult Health

## 2021-12-27 ENCOUNTER — Telehealth: Payer: Medicare Other | Admitting: Physician Assistant

## 2021-12-27 DIAGNOSIS — J019 Acute sinusitis, unspecified: Secondary | ICD-10-CM | POA: Diagnosis not present

## 2021-12-27 DIAGNOSIS — B9689 Other specified bacterial agents as the cause of diseases classified elsewhere: Secondary | ICD-10-CM | POA: Diagnosis not present

## 2021-12-27 MED ORDER — BENZONATATE 100 MG PO CAPS: 100.0000 mg | ORAL_CAPSULE | Freq: Three times a day (TID) | ORAL | 0 refills | Status: DC | PRN

## 2021-12-27 MED ORDER — AMOXICILLIN-POT CLAVULANATE 875-125 MG PO TABS: 1.0000 | ORAL_TABLET | Freq: Two times a day (BID) | ORAL | 0 refills | Status: DC

## 2021-12-27 NOTE — Progress Notes (Signed)
E-Visit for Sinus Problems  We are sorry that you are not feeling well.  Here is how we plan to help!  Based on what you have shared with me it looks like you have sinusitis.  Sinusitis is inflammation and infection in the sinus cavities of the head.  Based on your presentation I believe you most likely have Acute Bacterial Sinusitis.  This is an infection caused by bacteria and is treated with antibiotics. I have prescribed Augmentin '875mg'$ /'125mg'$  one tablet twice daily with food, for 7 days. You may use an oral decongestant such as Mucinex D or if you have glaucoma or high blood pressure use plain Mucinex. Saline nasal spray help and can safely be used as often as needed for congestion.  If you develop worsening sinus pain, fever or notice severe headache and vision changes, or if symptoms are not better after completion of antibiotic, please schedule an appointment with a health care provider.    I have also sent in a prescription cough medication to use as directed, when needed.  Sinus infections are not as easily transmitted as other respiratory infection, however we still recommend that you avoid close contact with loved ones, especially the very young and elderly.  Remember to wash your hands thoroughly throughout the day as this is the number one way to prevent the spread of infection!  Home Care: Only take medications as instructed by your medical team. Complete the entire course of an antibiotic. Do not take these medications with alcohol. A steam or ultrasonic humidifier can help congestion.  You can place a towel over your head and breathe in the steam from hot water coming from a faucet. Avoid close contacts especially the very young and the elderly. Cover your mouth when you cough or sneeze. Always remember to wash your hands.  Get Help Right Away If: You develop worsening fever or sinus pain. You develop a severe head ache or visual changes. Your symptoms persist after you have  completed your treatment plan.  Make sure you Understand these instructions. Will watch your condition. Will get help right away if you are not doing well or get worse.  Thank you for choosing an e-visit.  Your e-visit answers were reviewed by a board certified advanced clinical practitioner to complete your personal care plan. Depending upon the condition, your plan could have included both over the counter or prescription medications.  Please review your pharmacy choice. Make sure the pharmacy is open so you can pick up prescription now. If there is a problem, you may contact your provider through [employer] and have the prescription routed to another pharmacy.  Your safety is important to [place]. If you have drug allergies check your prescription carefully.   For the next 24 hours you can use MyChart to ask questions about today's visit, request a non-urgent call back, or ask for a work or school excuse. You will get an email in the next two days asking about your experience. I hope that your e-visit has been valuable and will speed your recovery.

## 2021-12-27 NOTE — Progress Notes (Signed)
I have spent 5 minutes in review of e-visit questionnaire, review and updating patient chart, medical decision making and response to patient.   Daivion Cody Domonic Kimball, PA-C    

## 2022-01-02 ENCOUNTER — Ambulatory Visit
Admission: EM | Admit: 2022-01-02 | Discharge: 2022-01-02 | Disposition: A | Discharge status: Home / Self Care | Payer: Medicare Other | Attending: Emergency Medicine | Admitting: Emergency Medicine

## 2022-01-02 ENCOUNTER — Telehealth: Payer: Medicare Other | Admitting: Physician Assistant

## 2022-01-02 DIAGNOSIS — J329 Chronic sinusitis, unspecified: Secondary | ICD-10-CM

## 2022-01-02 DIAGNOSIS — J0100 Acute maxillary sinusitis, unspecified: Secondary | ICD-10-CM

## 2022-01-02 MED ORDER — DOXYCYCLINE HYCLATE 100 MG PO CAPS: 100.0000 mg | ORAL_CAPSULE | Freq: Two times a day (BID) | ORAL | 0 refills | Status: AC

## 2022-01-02 MED ORDER — PREDNISONE 10 MG (21) PO TBPK: ORAL_TABLET | Freq: Every day | ORAL | 0 refills | Status: DC

## 2022-01-02 NOTE — Progress Notes (Signed)
Because or recurring/persistent symptoms despite treatment via e-visit, I feel your condition warrants further evaluation and I recommend that you be seen in a face to face visit.   NOTE: There will be NO CHARGE for this eVisit   If you are having a true medical emergency please call 911.      For an urgent face to face visit, [facility] has seven urgent care centers for your convenience:     [facility] [place] [facility] [place] Get Driving Directions [phone] [place], [place] [address]    [facility]) Get Driving Directions [phone] [place], [place] [address]  [facility] [place] ([place]) Get Driving Directions [phone] [address] [place],  [place]  [address]  [place] at [address] Get Driving Directions  [phone] [address] [place],  [place] [address]   [facility] Get Driving Directions [phone] [address] [place], [place], [place] [address]   [facility] Get Driving Directions  [phone] [address] [place], [place] [address]    [facility] at [place] Get Driving Directions [phone] [address], [place], [place] [address]  Your MyChart E-visit questionnaire answers were reviewed by a board certified advanced clinical practitioner to complete your personal care plan based on your specific symptoms.  Thank you for using e-Visits.

## 2022-01-02 NOTE — ED Provider Notes (Signed)
[facility]    CSN: [id number] Arrival date & time: 01/02/22  1224      History   Chief Complaint Chief Complaint  Patient presents with   Facial Pain    HPI Dustin [name] is a 66 y.o. male.  Patient presents with sinus pain, sinus pressure, sinus headache, sinus congestion x2 weeks.  He reports history of frequent sinus infections and sinus surgery in 2020.  He denies fever, chills, sore throat, cough, shortness of breath, or other symptoms.  Patient had an E-visit on 12/27/2021; diagnosed with acute sinusitis; treated with Augmentin and Tessalon Perles.  He attempted an E-visit again today and was instructed to be seen in person.  The history is provided by the patient and medical records.    Past Medical History:  Diagnosis Date   Allergy    Hyperlipidemia    Hypertension    Vertigo 06/03/2015   1 episode   Wears hearing aid in both ears     Patient Active Problem List   Diagnosis Date Noted   OSA (obstructive sleep apnea) 11/22/2021   Erectile dysfunction 04/13/2021   Elbow pain 04/13/2021   Healthcare maintenance 03/31/2020   Nocturia 10/21/2019   Paresthesia 09/24/2019   Snoring 02/20/2018   Advance care planning 02/08/2014   Family history of prostate cancer 11/18/2010   Essential hypertension 02/19/2007   Allergic rhinitis 02/19/2007    Past Surgical History:  Procedure Laterality Date   ETHMOIDECTOMY Bilateral 02/27/2019   Procedure: ETHMOIDECTOMY;  Surgeon: Margaretha Sheffield, MD;  Location: [facility];  Service: ENT;  Laterality: Bilateral;   FRONTAL SINUS EXPLORATION Bilateral 02/27/2019   Procedure: FRONTAL SINUS EXPLORATION;  Surgeon: Margaretha Sheffield, MD;  Location: [facility];  Service: ENT;  Laterality: Bilateral;   IMAGE GUIDED SINUS SURGERY Bilateral 02/27/2019   Procedure: IMAGE GUIDED SINUS SURGERY;  Surgeon: Margaretha Sheffield, MD;  Location: [facility];  Service: ENT;  Laterality: Bilateral;   LASIK Bilateral 2005    Dr Lucita Ferrara   MAXILLARY ANTROSTOMY Bilateral 02/27/2019   Procedure: MAXILLARY ANTROSTOMY WITH TISSUE REMOVAL;  Surgeon: Margaretha Sheffield, MD;  Location: [facility];  Service: ENT;  Laterality: Bilateral;   SEPTOPLASTY Bilateral 02/27/2019   Procedure: SEPTOPLASTY;  Surgeon: Margaretha Sheffield, MD;  Location: [facility];  Service: ENT;  Laterality: Bilateral;  disk put on desk in charge nurse office, 02/06/2019 ds put 2nd disk on charge nurse desk 10-29 kp   TURBINATE REDUCTION Bilateral 02/27/2019   Procedure: TURBINATE REDUCTION;  Surgeon: Margaretha Sheffield, MD;  Location: [facility];  Service: ENT;  Laterality: Bilateral;   TYMPANOSTOMY TUBE PLACEMENT     VASECTOMY  04/1995       Home Medications    Prior to Admission medications   Medication Sig Start Date End Date Taking? Authorizing Provider  doxycycline (VIBRAMYCIN) 100 MG capsule Take 1 capsule (100 mg total) by mouth 2 (two) times daily for 10 days. 01/02/22 01/12/22 Yes Sharion Balloon, NP  predniSONE (STERAPRED UNI-PAK 21 TAB) 10 MG (21) TBPK tablet Take by mouth daily. As directed 01/02/22  Yes Sharion Balloon, NP  tadalafil (CIALIS) 5 MG tablet Take 0.5-1 tablets (2.5-5 mg total) by mouth daily. 05/09/21   Tonia Ghent, MD  benzonatate (TESSALON) 100 MG capsule Take 1 capsule (100 mg total) by mouth 3 (three) times daily as needed for cough. 12/27/21   Brunetta Jeans, PA-C  fluticasone (FLONASE) 50 MCG/ACT nasal spray USE 1-2 SPRAYS IN BOTH NOSTRILS  EVERY DAY 04/08/18   Tonia Ghent, MD  hydrochlorothiazide (HYDRODIURIL) 25 MG tablet Take 1 tablet (25 mg total) by mouth daily. 04/07/21   Tonia Ghent, MD  ipratropium (ATROVENT) 0.03 % nasal spray Place 2 sprays into both nostrils every 12 (twelve) hours. 04/07/21   Tonia Ghent, MD  metoprolol succinate (TOPROL-XL) 50 MG 24 hr tablet Take 1 tablet (50 mg total) by mouth daily. Take with or immediately following a meal. 12/29/20   Tonia Ghent,  MD    Family History Family History  Problem Relation Age of Onset   Hyperlipidemia Mother    Hypertension Mother    Coronary artery disease Father    Stroke Father    Cancer Father        prostate   Heart disease Father    Prostate cancer Father    COPD Sister    Diabetes Paternal Uncle    Diabetes Paternal Grandfather    Colon cancer Neg Hx     Social History Social History   Tobacco Use   Smoking status: Former    Packs/day: 0.50    Years: 4.00    Total pack years: 2.00    Types: Cigarettes    Quit date: 1975    Years since quitting: 48.7   Smokeless tobacco: Never  Vaping Use   Vaping Use: Never used  Substance Use Topics   Alcohol use: Yes    Alcohol/week: 0.0 standard drinks of alcohol    Comment: occ glass of wine/week   Drug use: No     Allergies   Antihistamines, diphenhydramine-type; Erythromycin; Flomax [tamsulosin]; Guaifenesin & derivatives; and Metoprolol   Review of Systems Review of Systems  Constitutional:  Negative for chills and fever.  HENT:  Positive for congestion, postnasal drip, sinus pressure and sinus pain. Negative for ear pain and sore throat.   Respiratory:  Negative for cough and shortness of breath.   Cardiovascular:  Negative for chest pain and palpitations.  All other systems reviewed and are negative.    Physical Exam Triage Vital Signs ED Triage Vitals  Enc Vitals Group     BP      Pulse      Resp      Temp      Temp src      SpO2      Weight      Height      Head Circumference      Peak Flow      Pain Score      Pain Loc      Pain Edu?      Excl. in [place]?    No data found.  Updated Vital Signs BP (!) 142/73   Pulse 78   Temp 98.2 F (36.8 C)   Resp 19   Ht '5\' 8"'$  (1.727 m)   Wt 278 lb (126.1 kg)   SpO2 95%   BMI 42.27 kg/m   Visual Acuity Right Eye Distance:   Left Eye Distance:   Bilateral Distance:    Right Eye Near:   Left Eye Near:    Bilateral Near:     Physical Exam Vitals and  nursing note reviewed.  Constitutional:      General: He is not in acute distress.    Appearance: He is well-developed. He is obese. He is not ill-appearing.  HENT:     Right Ear: Tympanic membrane normal.     Left Ear: Tympanic membrane normal.  Nose: Congestion present.     Mouth/Throat:     Mouth: Mucous membranes are moist.     Pharynx: Oropharynx is clear.  Cardiovascular:     Rate and Rhythm: Normal rate and regular rhythm.     Heart sounds: Normal heart sounds.  Pulmonary:     Effort: Pulmonary effort is normal. No respiratory distress.     Breath sounds: Normal breath sounds.  Musculoskeletal:     Cervical back: Neck supple.  Skin:    General: Skin is warm and dry.  Neurological:     Mental Status: He is alert.  Psychiatric:        Mood and Affect: Mood normal.        Behavior: Behavior normal.      UC Treatments / Results  Labs (all labs ordered are listed, but only abnormal results are displayed) Labs Reviewed - No data to display  EKG   Radiology No results found.  Procedures Procedures (including critical care time)  Medications Ordered in UC Medications - No data to display  Initial Impression / Assessment and Plan / UC Course  I have reviewed the triage vital signs and the nursing notes.  Pertinent labs & imaging results that were available during my care of the patient were reviewed by me and considered in my medical decision making (see chart for details).    Acute sinusitis.  Patient completed a course of Augmentin that was prescribed on an E-visit on 12/27/2021.  His symptoms have not improved.  He has history of frequent sinus infections and previous sinus surgery.  Treating today with doxycycline and prednisone.  Instructed patient to follow-up with his PCP or ENT.  He agrees to plan of care.  Final Clinical Impressions(s) / UC Diagnoses   Final diagnoses:  Acute non-recurrent maxillary sinusitis     Discharge Instructions       Take the doxycycline and prednisone as directed.  Follow up with your primary care provider or ENT.        ED Prescriptions     Medication Sig Dispense Auth. Provider   predniSONE (STERAPRED UNI-PAK 21 TAB) 10 MG (21) TBPK tablet Take by mouth daily. As directed 21 tablet Sharion Balloon, NP   doxycycline (VIBRAMYCIN) 100 MG capsule Take 1 capsule (100 mg total) by mouth 2 (two) times daily for 10 days. 20 capsule Sharion Balloon, NP      PDMP not reviewed this encounter.   Sharion Balloon, NP 01/02/22 1313

## 2022-01-02 NOTE — ED Triage Notes (Signed)
Patient to Urgent Care with complaints of headache/ facial pain/ teeth pain x2 weeks. Reports completing an e-visit and has taken tessalon/ augmention for a sinus infection but reports still feeling badly.   Denies any fevers.

## 2022-01-02 NOTE — Discharge Instructions (Addendum)
Take the doxycycline and prednisone as directed.  Follow up with your primary care provider or ENT.

## 2022-01-06 ENCOUNTER — Other Ambulatory Visit: Payer: Self-pay | Admitting: Family Medicine

## 2022-01-10 ENCOUNTER — Telehealth: Payer: Self-pay | Admitting: Family Medicine

## 2022-01-10 NOTE — Telephone Encounter (Signed)
Patient left a message wanting to be scheduled for counseling mainly focusing on anger and stress management. I called him back and left a message letting him know that he will need to have his PCP send a referral for [place] to be able to schedule him.

## 2022-01-11 ENCOUNTER — Encounter: Payer: Self-pay | Admitting: Family Medicine

## 2022-01-13 ENCOUNTER — Other Ambulatory Visit: Payer: Self-pay | Admitting: Family Medicine

## 2022-01-13 ENCOUNTER — Telehealth: Payer: Self-pay | Admitting: Family Medicine

## 2022-01-13 DIAGNOSIS — Z659 Problem related to unspecified psychosocial circumstances: Secondary | ICD-10-CM

## 2022-01-13 NOTE — Telephone Encounter (Signed)
Please see my chart message.  I put in the referral to psychiatry.  He should be able to use that referral to get set up with counseling.  Please let me know if he has any trouble getting the appointment.  If he is having urgent symptoms related to his mood (ie thoughts of self-harm, etc.), then please advise him to be seen urgently.  Thanks.

## 2022-01-16 NOTE — Telephone Encounter (Signed)
Left message to return call to our office.  

## 2022-01-23 ENCOUNTER — Ambulatory Visit (INDEPENDENT_AMBULATORY_CARE_PROVIDER_SITE_OTHER): Payer: Medicare Other

## 2022-01-23 DIAGNOSIS — G4733 Obstructive sleep apnea (adult) (pediatric): Secondary | ICD-10-CM

## 2022-01-25 NOTE — Telephone Encounter (Signed)
Patient aware referral was done

## 2022-02-09 ENCOUNTER — Encounter: Payer: Self-pay | Admitting: Family Medicine

## 2022-02-10 ENCOUNTER — Encounter: Payer: Self-pay | Admitting: Family Medicine

## 2022-02-10 MED ORDER — FLUTICASONE PROPIONATE 50 MCG/ACT NA SUSP: NASAL | 12 refills | Status: DC

## 2022-02-14 ENCOUNTER — Other Ambulatory Visit: Payer: Self-pay | Admitting: Family Medicine

## 2022-02-14 MED ORDER — METOPROLOL SUCCINATE ER 25 MG PO TB24: ORAL_TABLET | ORAL | Status: DC

## 2022-02-14 MED ORDER — HYDROCHLOROTHIAZIDE 12.5 MG PO TABS: 12.5000 mg | ORAL_TABLET | Freq: Every day | ORAL | Status: DC

## 2022-02-15 DIAGNOSIS — G4733 Obstructive sleep apnea (adult) (pediatric): Secondary | ICD-10-CM

## 2022-02-21 ENCOUNTER — Telehealth: Payer: Medicare Other | Admitting: Physician Assistant

## 2022-02-21 DIAGNOSIS — B9689 Other specified bacterial agents as the cause of diseases classified elsewhere: Secondary | ICD-10-CM

## 2022-02-21 DIAGNOSIS — J019 Acute sinusitis, unspecified: Secondary | ICD-10-CM | POA: Diagnosis not present

## 2022-02-21 MED ORDER — DOXYCYCLINE HYCLATE 100 MG PO TABS: 100.0000 mg | ORAL_TABLET | Freq: Two times a day (BID) | ORAL | 0 refills | Status: DC

## 2022-02-21 NOTE — Progress Notes (Signed)
I have spent 5 minutes in review of e-visit questionnaire, review and updating patient chart, medical decision making and response to patient.   Benjamin Cody Yaris Ferrell, PA-C    

## 2022-02-21 NOTE — Progress Notes (Signed)

## 2022-02-25 ENCOUNTER — Other Ambulatory Visit: Payer: Self-pay | Admitting: Family Medicine

## 2022-02-27 ENCOUNTER — Telehealth: Payer: Self-pay | Admitting: Pulmonary Disease

## 2022-02-27 DIAGNOSIS — G4733 Obstructive sleep apnea (adult) (pediatric): Secondary | ICD-10-CM | POA: Diagnosis not present

## 2022-02-27 NOTE — Telephone Encounter (Signed)
I gave several studies to [place] at the [place] office to take to [employer] office to get scanned in. Can you please check with Dustin [name] about when this will be scanned in.  If Dustin [name] doesn't have this study, then please let me know and I can print it again.    Please let him know his sleep study showed moderate obstructive sleep apnea.  He needs an ROV with me or Tammy to discuss treatment options.

## 2022-02-27 NOTE — Telephone Encounter (Signed)
Patient is aware of results and voiced his understanding.  Mychart visit scheduled 03/01/2022 at 9:30.  Dustin [name], please advise if you have study. This will need to be scanned in prior to visit. Thank you.

## 2022-02-27 NOTE — Telephone Encounter (Signed)
Called and spoke to patient. He is requesting sleep study results.  According to appt desk, HST was completed 02/15/2022, however it has not been scanned into chart yet.   Dr. Halford Chessman, please advise. Thanks

## 2022-02-28 NOTE — Telephone Encounter (Signed)
HST printed and signed by Dr. Halford Chessman. Faxing to [facility] to have scanned in patients chart or to give to [facility] to scan.

## 2022-03-01 ENCOUNTER — Telehealth (INDEPENDENT_AMBULATORY_CARE_PROVIDER_SITE_OTHER): Payer: Managed Care, Other (non HMO) | Admitting: Adult Health

## 2022-03-01 ENCOUNTER — Encounter: Payer: Self-pay | Admitting: Adult Health

## 2022-03-01 DIAGNOSIS — G4733 Obstructive sleep apnea (adult) (pediatric): Secondary | ICD-10-CM | POA: Diagnosis not present

## 2022-03-01 MED ORDER — METOPROLOL SUCCINATE ER 25 MG PO TB24: ORAL_TABLET | ORAL | 1 refills | Status: DC

## 2022-03-01 MED ORDER — HYDROCHLOROTHIAZIDE 12.5 MG PO TABS: 12.5000 mg | ORAL_TABLET | Freq: Every day | ORAL | 1 refills | Status: DC

## 2022-03-01 NOTE — Patient Instructions (Signed)
Begin CPAP at bedtime goal is to wear all night long for at least 6 or more hours  Work on healthy sleep regimen  Work on healthy weight  Do not drive if sleepy  Follow up in 3 months and As needed

## 2022-03-01 NOTE — Progress Notes (Signed)
Virtual Visit via Video Note  I connected with Dustin [name] on 03/01/22 at  9:30 AM EST by a video enabled telemedicine application and verified that I am speaking with the correct person using two identifiers.  Location: Patient: Home  Provider: Office    I discussed the limitations of evaluation and management by telemedicine and the availability of in person appointments. The patient expressed understanding and agreed to proceed.  History of Present Illness: 66 year old male followed for sleep apnea chronic cough Today's virtual visit is a 68-monthfollow-up.  Patient was previously diagnosed with sleep apnea in the past.  Was started on CPAP.  But says that it was not very comfortable.  Then he was told his machine become under recall.  Patient complained of ongoing snoring and daytime sleepiness.  He was set up for repeat sleep study that was completed February 15, 2022 that showed moderate sleep apnea with AHI 25.9/hour and SPO2 low at 78%.  We discussed his sleep study results.  Went over treatment options including weight loss, oral appliance and CPAP therapy.  Reviewed Inspire device. Patient would like to proceed with CPAP therapy.  [place] .  Works from home     Observations/Objective: Home sleep study February 15, 2022 showed moderate sleep apnea with AHI at 25.9/hour and SPO2 low at 78%  Sleep Study 04/04/18 Mild obstructive sleep apnea with an AHI of 6.5 and SpO2 low of 80%.   FENO 07/30/88-21  Assessment and Plan: Moderate obstructive sleep apnea-we reviewed sleep study results in detail.  Patient has significant symptom burden.  We will proceed with CPAP therapy.  Begin CPAP AutoSet 5 to 15 cm H2O.  Mask of choice. - discussed how weight can impact sleep and risk for sleep disordered breathing - discussed options to assist with weight loss: combination of diet modification, cardiovascular and strength training exercises   - had an  extensive discussion regarding the adverse health consequences related to untreated sleep disordered breathing - specifically discussed the risks for hypertension, coronary artery disease, cardiac dysrhythmias, cerebrovascular disease, and diabetes - lifestyle modification discussed   - discussed how sleep disruption can increase risk of accidents, particularly when driving - safe driving practices were discussed     Plan  Patient Instructions  Begin CPAP at bedtime goal is to wear all night long for at least 6 or more hours  Work on healthy sleep regimen  Work on healthy weight  Do not drive if sleepy  Follow up in 3 months and As needed       Follow Up Instructions:    I discussed the assessment and treatment plan with the patient. The patient was provided an opportunity to ask questions and all were answered. The patient agreed with the plan and demonstrated an understanding of the instructions.   The patient was advised to call back or seek an in-person evaluation if the symptoms worsen or if the condition fails to improve as anticipated.  I provided 20  minutes of non-face-to-face time during this encounter.   TRexene Edison NP

## 2022-03-01 NOTE — Addendum Note (Signed)
Addended by: Sherrilee Gilles B on: 03/01/2022 10:06 AM   Modules accepted: Orders

## 2022-03-01 NOTE — Addendum Note (Signed)
Addended by: Meredith Staggers A on: 03/01/2022 10:15 AM   Modules accepted: Orders

## 2022-03-09 ENCOUNTER — Telehealth: Payer: Managed Care, Other (non HMO) | Admitting: Family Medicine

## 2022-03-09 DIAGNOSIS — H5789 Other specified disorders of eye and adnexa: Secondary | ICD-10-CM

## 2022-03-09 NOTE — Progress Notes (Signed)
[place]   Currently out of state in [place].

## 2022-03-11 ENCOUNTER — Telehealth: Payer: Managed Care, Other (non HMO) | Admitting: Physician Assistant

## 2022-03-11 DIAGNOSIS — B9689 Other specified bacterial agents as the cause of diseases classified elsewhere: Secondary | ICD-10-CM

## 2022-03-11 DIAGNOSIS — H109 Unspecified conjunctivitis: Secondary | ICD-10-CM | POA: Diagnosis not present

## 2022-03-11 MED ORDER — POLYMYXIN B-TRIMETHOPRIM 10000-0.1 UNIT/ML-% OP SOLN: 1.0000 [drp] | OPHTHALMIC | 0 refills | Status: DC

## 2022-03-11 NOTE — Progress Notes (Signed)

## 2022-03-13 ENCOUNTER — Other Ambulatory Visit: Payer: Self-pay | Admitting: Family Medicine

## 2022-03-15 NOTE — Telephone Encounter (Signed)
Tammy, please advise. Thanks 

## 2022-03-16 NOTE — Telephone Encounter (Signed)
Spoke to patient via telephone and relayed below message. He is aware that we do not recommend Luna machine from any resmed machine is okay.  He voiced his understanding.  Nothing further needed.

## 2022-03-16 NOTE — Telephone Encounter (Signed)
DME company typically supplies Resmed CPAP machines

## 2022-03-17 ENCOUNTER — Telehealth: Payer: Managed Care, Other (non HMO) | Admitting: Physician Assistant

## 2022-03-17 DIAGNOSIS — B9789 Other viral agents as the cause of diseases classified elsewhere: Secondary | ICD-10-CM | POA: Diagnosis not present

## 2022-03-17 DIAGNOSIS — J019 Acute sinusitis, unspecified: Secondary | ICD-10-CM

## 2022-03-17 MED ORDER — AZELASTINE HCL 0.1 % NA SOLN: 1.0000 | Freq: Two times a day (BID) | NASAL | 0 refills | Status: DC

## 2022-03-17 NOTE — Progress Notes (Signed)
E-Visit for Sinus Problems  We are sorry that you are not feeling well.  Here is how we plan to help!  Based on what you have shared with me it looks like you have sinusitis.  Sinusitis is inflammation and infection in the sinus cavities of the head.  Based on your presentation I believe you most likely have Acute Viral Sinusitis.This is an infection most likely caused by a virus. There is not specific treatment for viral sinusitis other than to help you with the symptoms until the infection runs its course.  You may use an oral decongestant such as Mucinex D or if you have glaucoma or high blood pressure use plain Mucinex. Saline nasal spray help and can safely be used as often as needed for congestion, I have prescribed: Azelastine nasal spray 2 sprays in each nostril twice a day  Some authorities believe that zinc sprays or the use of Echinacea may shorten the course of your symptoms.  Sinus infections are not as easily transmitted as other respiratory infection, however we still recommend that you avoid close contact with loved ones, especially the very young and elderly.  Remember to wash your hands thoroughly throughout the day as this is the number one way to prevent the spread of infection!  Home Care: Only take medications as instructed by your medical team. Do not take these medications with alcohol. A steam or ultrasonic humidifier can help congestion.  You can place a towel over your head and breathe in the steam from hot water coming from a faucet. Avoid close contacts especially the very young and the elderly. Cover your mouth when you cough or sneeze. Always remember to wash your hands.  Get Help Right Away If: You develop worsening fever or sinus pain. You develop a severe head ache or visual changes. Your symptoms persist after you have completed your treatment plan.  Make sure you Understand these instructions. Will watch your condition. Will get help right away if you are  not doing well or get worse.   Thank you for choosing an e-visit.  Your e-visit answers were reviewed by a board certified advanced clinical practitioner to complete your personal care plan. Depending upon the condition, your plan could have included both over the counter or prescription medications.  Please review your pharmacy choice. Make sure the pharmacy is open so you can pick up prescription now. If there is a problem, you may contact your provider through MyChart messaging and have the prescription routed to another pharmacy.  Your safety is important to us. If you have drug allergies check your prescription carefully.   For the next 24 hours you can use MyChart to ask questions about today's visit, request a non-urgent call back, or ask for a work or school excuse. You will get an email in the next two days asking about your experience. I hope that your e-visit has been valuable and will speed your recovery.  I have spent 5 minutes in review of e-visit questionnaire, review and updating patient chart, medical decision making and response to patient.   Estefano Victory M Darryn Kydd, PA-C  

## 2022-03-20 ENCOUNTER — Ambulatory Visit: Payer: Managed Care, Other (non HMO) | Admitting: Family Medicine

## 2022-03-20 ENCOUNTER — Encounter: Payer: Self-pay | Admitting: Family Medicine

## 2022-03-20 VITALS — BP 124/72 | HR 66 | Temp 97.1°F | Ht 68.0 in | Wt 289.0 lb

## 2022-03-20 DIAGNOSIS — J0100 Acute maxillary sinusitis, unspecified: Secondary | ICD-10-CM

## 2022-03-20 MED ORDER — AMOXICILLIN-POT CLAVULANATE 875-125 MG PO TABS: 1.0000 | ORAL_TABLET | Freq: Two times a day (BID) | ORAL | 0 refills | Status: DC

## 2022-03-20 NOTE — Progress Notes (Unsigned)
Previous MyChart message discussed with patient.  ================================= Previously with conjunctivitis, started his antibiotic on the Saturday after Thanksgiving.  His eye irritation did not completely resolve in the meantime, in spite of using Polytrim drops.  He has had recurrent sinus infections over the past year.  With the most recent episode he has been taking Tylenol and ibuprofen as needed.  He has history of previous sinus surgery, discussed with patient.  He improved after that but had more sinus symptoms in the meantime.  His CPAP machine is to be ordered soon.  His colonoscopy is pending.  His cataract surgery is pending. R>L eye discharge and irritation recently.  HA and prev tooth pain but not now.  R facial puffiness recently noted.  No tongue or lip swelling.  No dysphagia.  No wheeze.    Meds, vitals, and allergies reviewed.   ROS: Per HPI unless specifically indicated in ROS section   GEN: nad, alert and oriented HEENT: mucous membranes moist, tm w/o erythema, sinuses nontender but he has puffiness into the right parotid gland without tenderness.  Nasal exam stuffy, clear discharge noted,  OP with cobblestoning, no lip or tongue swelling. NECK: supple w/o LA, no stridor. CV: rrr.   PULM: ctab, no inc wob EXT: no edema SKIN: no acute rash

## 2022-03-20 NOTE — Patient Instructions (Signed)
Start augmentin and update me about your situation in about 2 days.  Likely need a CT and possibly ENT evaluation after that.  Rest and fluids.  Stop the eye drops in the meantime.  Take care.  Glad to see you.

## 2022-03-20 NOTE — Telephone Encounter (Signed)
No worries. The medicare visits can be confusing. They are a two part appointment if you see the health advisor nurse which is usually by phone and then the in person part is with Dr. Damita Dunnings.

## 2022-03-22 NOTE — Assessment & Plan Note (Signed)
Discussed options.  Still okay for outpatient follow-up.  Start augmentin and update me about his situation in about 2 days.  Likely need a sinus CT and possibly ENT evaluation after that, likely to be done after completion of antibiotics. Rest and fluids in the meantime.  He agrees to plan.  Stop Polytrim.  I suspect that some of his excessive tearing is due to sinus congestion and nasal congestion limiting tear duct outflow.

## 2022-03-23 ENCOUNTER — Encounter: Payer: Self-pay | Admitting: Family Medicine

## 2022-03-26 ENCOUNTER — Other Ambulatory Visit: Payer: Self-pay | Admitting: Family Medicine

## 2022-03-26 DIAGNOSIS — J329 Chronic sinusitis, unspecified: Secondary | ICD-10-CM

## 2022-03-26 DIAGNOSIS — Z9889 Other specified postprocedural states: Secondary | ICD-10-CM

## 2022-03-26 MED ORDER — PREDNISONE 10 MG (21) PO TBPK: ORAL_TABLET | Freq: Every day | ORAL | 0 refills | Status: DC

## 2022-03-27 ENCOUNTER — Encounter: Payer: Self-pay | Admitting: *Deleted

## 2022-03-27 ENCOUNTER — Other Ambulatory Visit: Payer: Self-pay | Admitting: Family Medicine

## 2022-03-27 DIAGNOSIS — I10 Essential (primary) hypertension: Secondary | ICD-10-CM

## 2022-03-27 DIAGNOSIS — Z125 Encounter for screening for malignant neoplasm of prostate: Secondary | ICD-10-CM

## 2022-03-29 ENCOUNTER — Other Ambulatory Visit: Payer: Self-pay | Admitting: Family Medicine

## 2022-03-29 ENCOUNTER — Other Ambulatory Visit (INDEPENDENT_AMBULATORY_CARE_PROVIDER_SITE_OTHER): Payer: Managed Care, Other (non HMO)

## 2022-03-29 DIAGNOSIS — I10 Essential (primary) hypertension: Secondary | ICD-10-CM | POA: Diagnosis not present

## 2022-03-29 DIAGNOSIS — Z125 Encounter for screening for malignant neoplasm of prostate: Secondary | ICD-10-CM

## 2022-03-29 DIAGNOSIS — J329 Chronic sinusitis, unspecified: Secondary | ICD-10-CM

## 2022-03-29 DIAGNOSIS — Z9889 Other specified postprocedural states: Secondary | ICD-10-CM

## 2022-03-29 LAB — COMPREHENSIVE METABOLIC PANEL
ALT: 17 U/L (ref 0–53)
AST: 12 U/L (ref 0–37)
Albumin: 4.6 g/dL (ref 3.5–5.2)
Alkaline Phosphatase: 64 U/L (ref 39–117)
BUN: 23 mg/dL (ref 6–23)
CO2: 34 mEq/L — ABNORMAL HIGH (ref 19–32)
Calcium: 9.8 mg/dL (ref 8.4–10.5)
Chloride: 99 mEq/L (ref 96–112)
Creatinine, Ser: 0.84 mg/dL (ref 0.40–1.50)
GFR: 89.85 mL/min (ref >60.00)
Glucose, Bld: 99 mg/dL (ref 70–99)
Potassium: 4.3 mEq/L (ref 3.5–5.1)
Sodium: 140 mEq/L (ref 135–145)
Total Bilirubin: 0.3 mg/dL (ref 0.2–1.2)
Total Protein: 7.3 g/dL (ref 6.0–8.3)

## 2022-03-29 LAB — LIPID PANEL
Cholesterol: 186 mg/dL (ref 0–200)
HDL: 55.2 mg/dL (ref >39.00)
LDL Cholesterol: 118 mg/dL — ABNORMAL HIGH (ref 0–99)
NonHDL: 130.92
Total CHOL/HDL Ratio: 3
Triglycerides: 66 mg/dL (ref 0.0–149.0)
VLDL: 13.2 mg/dL (ref 0.0–40.0)

## 2022-03-29 LAB — PSA, MEDICARE: PSA: 0.3 ng/ml (ref 0.10–4.00)

## 2022-03-29 NOTE — Addendum Note (Signed)
Addended by: Virl Cagey on: 03/29/2022 12:06 PM   Modules accepted: Orders

## 2022-03-31 ENCOUNTER — Ambulatory Visit: Payer: Managed Care, Other (non HMO)

## 2022-03-31 ENCOUNTER — Ambulatory Visit
Admission: RE | Admit: 2022-03-31 | Discharge: 2022-03-31 | Disposition: A | Discharge status: Home / Self Care | Payer: Managed Care, Other (non HMO) | Source: Ambulatory Visit | Attending: Family Medicine | Admitting: Family Medicine

## 2022-03-31 ENCOUNTER — Ambulatory Visit (INDEPENDENT_AMBULATORY_CARE_PROVIDER_SITE_OTHER): Payer: Managed Care, Other (non HMO)

## 2022-03-31 VITALS — Ht 68.0 in | Wt 289.0 lb

## 2022-03-31 DIAGNOSIS — Z0000 Encounter for general adult medical examination without abnormal findings: Secondary | ICD-10-CM | POA: Diagnosis not present

## 2022-03-31 DIAGNOSIS — J329 Chronic sinusitis, unspecified: Secondary | ICD-10-CM

## 2022-03-31 DIAGNOSIS — Z9889 Other specified postprocedural states: Secondary | ICD-10-CM

## 2022-03-31 NOTE — Progress Notes (Signed)
Subjective:   Dustin [name] is a 66 y.o. male who presents for Medicare Annual/Subsequent preventive examination.  Review of Systems    No ROS.  Medicare Wellness Virtual Visit.  Visual/audio telehealth visit, UTA vital signs.   See social history for additional risk factors.   Cardiac Risk Factors include: advanced age (>86mn, >>76women);male gender     Objective:    Today's Vitals   03/31/22 1254  Weight: 289 lb (131.1 kg)  Height: '5\' 8"'$  (1.727 m)   Body mass index is 43.94 kg/m.     03/31/2022   12:52 PM 01/02/2022   12:34 PM 03/29/2021   12:02 PM 03/24/2020   12:04 PM 11/21/2019    1:44 PM 02/27/2019    6:49 AM 04/04/2018    8:49 PM  Advanced Directives  Does Patient Have a Medical Advance Directive? No No No No No No No  Would patient like information on creating a medical advance directive? No - Patient declined  Yes (MAU/Ambulatory/Procedural Areas - Information given) No - Patient declined  Yes (MAU/Ambulatory/Procedural Areas - Information given) No - Patient declined    Current Medications (verified) Outpatient Encounter Medications as of 03/31/2022  Medication Sig   amoxicillin-clavulanate (AUGMENTIN) 875-125 MG tablet Take 1 tablet by mouth 2 (two) times daily.   azelastine (ASTELIN) 0.1 % nasal spray Place 1 spray into both nostrils 2 (two) times daily. Use in each nostril as directed   fluticasone (FLONASE) 50 MCG/ACT nasal spray USE 1-2 SPRAYS IN BOTH NOSTRILS EVERY DAY   hydrochlorothiazide (HYDRODIURIL) 12.5 MG tablet Take 1 tablet (12.5 mg total) by mouth daily.   loratadine (CLARITIN) 10 MG tablet Take 10 mg by mouth daily.   metoprolol succinate (TOPROL-XL) 25 MG 24 hr tablet TAKE 1 TABLET BY MOUTH DAILY.**TAKE WITHOR IMMEDIATELY FOLLOWING A MEAL**   predniSONE (STERAPRED UNI-PAK 21 TAB) 10 MG (21) TBPK tablet Take by mouth daily. As directed with food.   tadalafil (CIALIS) 5 MG tablet Take 0.5-1 tablets (2.5-5 mg total) by mouth daily.   No  facility-administered encounter medications on file as of 03/31/2022.    Allergies (verified) Antihistamines, diphenhydramine-type; Erythromycin; Flomax [tamsulosin]; Guaifenesin & derivatives; and Metoprolol   History: Past Medical History:  Diagnosis Date   Allergy    Hyperlipidemia    Hypertension    Vertigo 06/03/2015   1 episode   Wears hearing aid in both ears    Past Surgical History:  Procedure Laterality Date   ETHMOIDECTOMY Bilateral 02/27/2019   Procedure: ETHMOIDECTOMY;  Surgeon: JMargaretha Sheffield MD;  [facility] [facility]  Service: ENT;  Laterality: Bilateral;   FRONTAL SINUS EXPLORATION Bilateral 02/27/2019   Procedure: FRONTAL SINUS EXPLORATION;  Surgeon: JMargaretha Sheffield MD;  [facility]  Service: ENT;  Laterality: Bilateral;   IMAGE GUIDED SINUS SURGERY Bilateral 02/27/2019   Procedure: IMAGE GUIDED SINUS SURGERY;  Surgeon: JMargaretha Sheffield MD;  [facility]  Service: ENT;  Laterality: Bilateral;   LASIK Bilateral 2005   Dr SLucita Ferrara  MAXILLARY ANTROSTOMY Bilateral 02/27/2019   Procedure: MAXILLARY ANTROSTOMY WITH TISSUE REMOVAL;  Surgeon: JMargaretha Sheffield MD;  [facility]  Service: ENT;  Laterality: Bilateral;   SEPTOPLASTY Bilateral 02/27/2019   Procedure: SEPTOPLASTY;  Surgeon: JMargaretha Sheffield MD;  [place] [facility]  Service: ENT;  Laterality: Bilateral;  disk put on desk in charge nurse office, 02/06/2019 ds put 2nd disk on charge nurse desk 10-29 kp   [place] 02/27/2019  Procedure: TURBINATE REDUCTION;  Surgeon: Margaretha Sheffield, MD;  Location: [facility];  Service: ENT;  Laterality: Bilateral;   TYMPANOSTOMY TUBE PLACEMENT     VASECTOMY  04/1995   Family History  Problem Relation Age of Onset   Hyperlipidemia Mother    Hypertension Mother    Coronary artery disease Father    Stroke Father    Cancer Father        prostate   Heart disease Father     Prostate cancer Father    COPD Sister    Diabetes Paternal Uncle    Diabetes Paternal Grandfather    Colon cancer Neg Hx    Social History   Socioeconomic History   Marital status: Married    Spouse name: Not on file   Number of children: 4   Years of education: Not on file   Highest education level: Not on file  Occupational History   Occupation: Reinsurance-Marketing with companies  Tobacco Use   Smoking status: Former    Packs/day: 0.50    Years: 4.00    Total pack years: 2.00    Types: Cigarettes    Quit date: 1975    Years since quitting: 48.9   Smokeless tobacco: Never  Vaping Use   Vaping Use: Never used  Substance and Sexual Activity   Alcohol use: Yes    Alcohol/week: 0.0 standard drinks of alcohol    Comment: occ glass of wine/week   Drug use: No   Sexual activity: Not on file  Other Topics Concern   Not on file  Social History Narrative   Lives with wife, married since 1987, prev widowed   From [place], [place]   4 kids combined with his 2nd wife, 6 grandkids   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   Right handed   Social Determinants of Health   Financial Resource Strain: Low Risk  (03/31/2022)   Overall Financial Resource Strain (CARDIA)    Difficulty of Paying Living Expenses: Not hard at all  Food Insecurity: No Food Insecurity (03/31/2022)   Hunger Vital Sign    Worried About Running Out of Food in the Last Year: Never true    Ran Out of Food in the Last Year: Never true  Transportation Needs: No Transportation Needs (03/31/2022)   PRAPARE - Hydrologist (Medical): No    Lack of Transportation (Non-Medical): No  Physical Activity: Inactive (03/31/2022)   Exercise Vital Sign    Days of Exercise per Week: 0 days    Minutes of Exercise per Session: 0 min  Stress: Stress Concern Present (03/31/2022)   [place]    Feeling of Stress : To some  extent  Social Connections: Unknown (03/31/2022)   Social Connection and Isolation Panel [NHANES]    Frequency of Communication with Friends and Family: More than three times a week    Frequency of Social Gatherings with Friends and Family: Twice a week    Attends Religious Services: Not on Advertising copywriter or Organizations: Yes    Attends Music therapist: More than 4 times per year    Marital Status: Married    Tobacco Counseling Counseling given: Not Answered   Clinical Intake:  Pre-visit preparation completed: Yes           How often do you need to have someone help you when you read instructions, pamphlets, or other  written materials from your doctor or pharmacy?: 1 - Never    Interpreter Needed?: No      Activities of Daily Living    03/31/2022   12:52 PM 03/27/2022    8:42 AM  In your present state of health, do you have any difficulty performing the following activities:  Hearing? 1 1  Vision? 1 1  Difficulty concentrating or making decisions? 0 0  Walking or climbing stairs? 0 0  Dressing or bathing? 0 0  Doing errands, shopping? 0 0  Preparing Food and eating ? N N  Using the Toilet? N N  In the past six months, have you accidently leaked urine? N N  Do you have problems with loss of bowel control? N N  Managing your Medications? N N  Managing your Finances? N N  Housekeeping or managing your Housekeeping? N N    Patient Care Team: Tonia Ghent, MD as PCP - General  Indicate any recent Medical Services you may have received from other than [facility] providers in the past year (date may be approximate).     Assessment:   This is a routine wellness examination for Abisai.  I connected with  Tracie Harrier on 03/31/22 by a audio enabled telemedicine application and verified that I am speaking with the correct person using two identifiers.  Patient Location: Home  Provider Location: Office/Clinic  I discussed the  limitations of evaluation and management by telemedicine. The patient expressed understanding and agreed to proceed.   Hearing/Vision screen Hearing Screening - Comments:: Decrease in hearing patient has hearing aids Vision Screening - Comments:: Followed by [facility] Wears corrective lenses Cataract extraction scheduled with [facility] They have seen their ophthalmologist in the last 12 months.    Dietary issues and exercise activities discussed: Current Exercise Habits: Home exercise routine, Intensity: Mild   Goals Addressed             This Visit's Progress    Patient Stated   On track    03/24/2020, I will maintain and continue medications as prescribed.      Patient Stated   On track    Would like to eat healthier, drink more water and walk more       Depression Screen    03/31/2022   12:52 PM 03/29/2021   12:06 PM 03/29/2020    3:39 PM 03/24/2020   12:10 PM 02/18/2018    3:18 PM 02/07/2017    5:30 PM 02/02/2016    5:23 PM  PHQ 2/9 Scores  PHQ - 2 Score 0 0 0 0 0 0 0  PHQ- 9 Score   0 0       Fall Risk    03/31/2022   12:51 PM 03/27/2022    8:42 AM 03/29/2021   12:04 PM 03/29/2020    3:39 PM 03/24/2020   12:10 PM  [place] in the past year? 0 0 0 0 0  Number falls in past yr:  0 0 0 0  Injury with Fall?  0 0 0 0  Risk for fall due to :   No Fall Risks  Medication side effect  Follow up Falls evaluation completed;Education provided  Falls prevention discussed Falls evaluation completed Falls prevention discussed;Falls evaluation completed    FALL RISK PREVENTION PERTAINING TO THE HOME: Home free of loose throw rugs in walkways, pet beds, electrical cords, etc? Yes  Adequate lighting in your home to reduce risk of  falls? Yes   ASSISTIVE DEVICES UTILIZED TO PREVENT FALLS: Life alert? No  Use of a cane, walker or w/c? No   TIMED UP AND GO: Was the test performed? No .   Cognitive Function:    03/24/2020   12:11 PM  MMSE -  Mini Mental State Exam  Orientation to time 5  Orientation to Place 5  Registration 3  Attention/ Calculation 5  Recall 3  Language- repeat 1        03/31/2022   12:53 PM  6CIT Screen  What Year? 0 points  What month? 0 points  What time? 0 points  Count back from 20 0 points  Months in reverse 2 points  Repeat phrase 0 points  Total Score 2 points    Immunizations Immunization History  Administered Date(s) Administered   Fluad Quad(high Dose 65+) 04/07/2021   Influenza Whole 02/11/2013   Influenza,inj,Quad PF,6+ Mos 02/09/2015, 02/02/2016, 01/05/2017, 02/18/2018   Influenza,inj,quad, With Preservative 01/25/2019   Influenza-Unspecified 01/14/2014   Moderna Sars-Covid-2 Vaccination 06/18/2019, 07/16/2019, 04/30/2020   Td 09/30/2001, 11/01/2018   Tdap 12/14/2011   Zoster Recombinat (Shingrix) 12/29/2017, 03/23/2018   Pneumococcal vaccine status: Due, Education has been provided regarding the importance of this vaccine. Advised may receive this vaccine at local pharmacy or Health Dept. Aware to provide a copy of the vaccination record if obtained from local pharmacy or Health Dept. Verbalized acceptance and understanding.  Screening Tests Health Maintenance  Topic Date Due   COLONOSCOPY (Pts 45-71yr Insurance coverage will need to be confirmed)  09/02/2015   COVID-19 Vaccine (4 - 2023-24 season) 04/05/2022 (Originally 12/16/2021)   INFLUENZA VACCINE  07/16/2022 (Originally 11/15/2021)   Pneumonia Vaccine 69 Years old (1 - PCV) 04/01/2023 (Originally 02/15/2019)   DTaP/Tdap/Td (4 - Td or Tdap) 10/31/2028   Hepatitis C Screening  Completed   Zoster Vaccines- Shingrix  Completed   HPV VACCINES  Aged Out   Health Maintenance Health Maintenance Due  Topic Date Due   COLONOSCOPY (Pts 45-445yrInsurance coverage will need to be confirmed)  09/02/2015   Colonoscopy- scheduled 2/29/23  Lung Cancer Screening: (Low Dose CT Chest recommended if Age 68-80ears, 30 pack-year  currently smoking OR have quit w/in 15years.) does not qualify.   Hepatitis C Screening: Completed 01/31/16.   Vision Screening: Recommended annual ophthalmology exams for early detection of glaucoma and other disorders of the eye.  Dental Screening: Recommended annual dental exams for proper oral hygiene  Community Resource Referral / Chronic Care Management: CRR required this visit?  No   CCM required this visit?  No      Plan:     I have personally reviewed and noted the following in the patient's chart:   Medical and social history Use of alcohol, tobacco or illicit drugs  Current medications and supplements including opioid prescriptions. Patient is not currently taking opioid prescriptions. Functional ability and status Nutritional status Physical activity Advanced directives List of other physicians Hospitalizations, surgeries, and ER visits in previous 12 months Vitals Screenings to include cognitive, depression, and falls Referrals and appointments  In addition, I have reviewed and discussed with patient certain preventive protocols, quality metrics, and best practice recommendations. A written personalized care plan for preventive services as well as general preventive health recommendations were provided to patient.     DeLeta JunglingLPN   [id number]

## 2022-03-31 NOTE — Patient Instructions (Addendum)
Dustin [name] , Thank you for taking time to come for your Medicare Wellness Visit. I appreciate your ongoing commitment to your health goals. Please review the following plan we discussed and let me know if I can assist you in the future.   These are the goals we discussed:  Goals      Patient Stated     03/24/2020, I will maintain and continue medications as prescribed.      Patient Stated     Would like to eat healthier, drink more water and walk more        This is a list of the screening recommended for you and due dates:  Health Maintenance  Topic Date Due   Colon Cancer Screening  09/02/2015   COVID-19 Vaccine (4 - 2023-24 season) 04/05/2022*   Flu Shot  07/16/2022*   Pneumonia Vaccine (1 - PCV) 04/01/2023*   DTaP/Tdap/Td vaccine (4 - Td or Tdap) 10/31/2028   Hepatitis C Screening: USPSTF Recommendation to screen - Ages 80-79 yo.  Completed   Zoster (Shingles) Vaccine  Completed   HPV Vaccine  Aged Out  *Topic was postponed. The date shown is not the original due date.    Advanced directives: End of life planning; Advanced aging; Advanced directives discussed.  No HCPOA/Living Will.  Additional information available in office. Declined at this time.  Conditions/risks identified: none new.  Next appointment: Follow up in one year for your annual wellness visit.   Preventive Care 65 Years and Older, Male  Preventive care refers to lifestyle choices and visits with your health care provider that can promote health and wellness. What does preventive care include? A yearly physical exam. This is also called an annual well check. Dental exams once or twice a year. Routine eye exams. Ask your health care provider how often you should have your eyes checked. Personal lifestyle choices, including: Daily care of your teeth and gums. Regular physical activity. Eating a healthy diet. Avoiding tobacco and drug use. Limiting alcohol use. Practicing safe sex. Taking low doses of  aspirin every day. Taking vitamin and mineral supplements as recommended by your health care provider. What happens during an annual well check? The services and screenings done by your health care provider during your annual well check will depend on your age, overall health, lifestyle risk factors, and family history of disease. Counseling  Your health care provider may ask you questions about your: Alcohol use. Tobacco use. Drug use. Emotional well-being. Home and relationship well-being. Sexual activity. Eating habits. History of falls. Memory and ability to understand (cognition). Work and work Statistician. Screening  You may have the following tests or measurements: Height, weight, and BMI. Blood pressure. Lipid and cholesterol levels. These may be checked every 5 years, or more frequently if you are over 61 years old. Skin check. Lung cancer screening. You may have this screening every year starting at age 84 if you have a 30-pack-year history of smoking and currently smoke or have quit within the past 15 years. Fecal occult blood test (FOBT) of the stool. You may have this test every year starting at age 44. Flexible sigmoidoscopy or colonoscopy. You may have a sigmoidoscopy every 5 years or a colonoscopy every 10 years starting at age 82. Prostate cancer screening. Recommendations will vary depending on your family history and other risks. Hepatitis C blood test. Hepatitis B blood test. Sexually transmitted disease (STD) testing. Diabetes screening. This is done by checking your blood sugar (glucose) after you have  not eaten for a while (fasting). You may have this done every 1-3 years. Abdominal aortic aneurysm (AAA) screening. You may need this if you are a current or former smoker. Osteoporosis. You may be screened starting at age 55 if you are at high risk. Talk with your health care provider about your test results, treatment options, and if necessary, the need for more  tests. Vaccines  Your health care provider may recommend certain vaccines, such as: Influenza vaccine. This is recommended every year. Tetanus, diphtheria, and acellular pertussis (Tdap, Td) vaccine. You may need a Td booster every 10 years. Zoster vaccine. You may need this after age 71. Pneumococcal 13-valent conjugate (PCV13) vaccine. One dose is recommended after age 65. Pneumococcal polysaccharide (PPSV23) vaccine. One dose is recommended after age 15. Talk to your health care provider about which screenings and vaccines you need and how often you need them. This information is not intended to replace advice given to you by your health care provider. Make sure you discuss any questions you have with your health care provider. Document Released: 04/30/2015 Document Revised: 12/22/2015 Document Reviewed: 02/02/2015 Elsevier Interactive Patient Education  2017 [employer] Prevention in the Home Falls can cause injuries. They can happen to people of all ages. There are many things you can do to make your home safe and to help prevent falls. What can I do on the outside of my home? Regularly fix the edges of walkways and driveways and fix any cracks. Remove anything that might make you trip as you walk through a door, such as a raised step or threshold. Trim any bushes or trees on the path to your home. Use bright outdoor lighting. Clear any walking paths of anything that might make someone trip, such as rocks or tools. Regularly check to see if handrails are loose or broken. Make sure that both sides of any steps have handrails. Any raised decks and porches should have guardrails on the edges. Have any leaves, snow, or ice cleared regularly. Use sand or salt on walking paths during winter. Clean up any spills in your garage right away. This includes oil or grease spills. What can I do in the bathroom? Use night lights. Install grab bars by the toilet and in the tub and shower.  Do not use towel bars as grab bars. Use non-skid mats or decals in the tub or shower. If you need to sit down in the shower, use a plastic, non-slip stool. Keep the floor dry. Clean up any water that spills on the floor as soon as it happens. Remove soap buildup in the tub or shower regularly. Attach bath mats securely with double-sided non-slip rug tape. Do not have throw rugs and other things on the floor that can make you trip. What can I do in the bedroom? Use night lights. Make sure that you have a light by your bed that is easy to reach. Do not use any sheets or blankets that are too big for your bed. They should not hang down onto the floor. Have a firm chair that has side arms. You can use this for support while you get dressed. Do not have throw rugs and other things on the floor that can make you trip. What can I do in the kitchen? Clean up any spills right away. Avoid walking on wet floors. Keep items that you use a lot in easy-to-reach places. If you need to reach something above you, use a strong step stool  that has a grab bar. Keep electrical cords out of the way. Do not use floor polish or wax that makes floors slippery. If you must use wax, use non-skid floor wax. Do not have throw rugs and other things on the floor that can make you trip. What can I do with my stairs? Do not leave any items on the stairs. Make sure that there are handrails on both sides of the stairs and use them. Fix handrails that are broken or loose. Make sure that handrails are as long as the stairways. Check any carpeting to make sure that it is firmly attached to the stairs. Fix any carpet that is loose or worn. Avoid having throw rugs at the top or bottom of the stairs. If you do have throw rugs, attach them to the floor with carpet tape. Make sure that you have a light switch at the top of the stairs and the bottom of the stairs. If you do not have them, ask someone to add them for you. What else  can I do to help prevent falls? Wear shoes that: Do not have high heels. Have rubber bottoms. Are comfortable and fit you well. Are closed at the toe. Do not wear sandals. If you use a stepladder: Make sure that it is fully opened. Do not climb a closed stepladder. Make sure that both sides of the stepladder are locked into place. Ask someone to hold it for you, if possible. Clearly mark and make sure that you can see: Any grab bars or handrails. First and last steps. Where the edge of each step is. Use tools that help you move around (mobility aids) if they are needed. These include: Canes. Walkers. Scooters. Crutches. Turn on the lights when you go into a dark area. Replace any light bulbs as soon as they burn out. Set up your furniture so you have a clear path. Avoid moving your furniture around. If any of your floors are uneven, fix them. If there are any pets around you, be aware of where they are. Review your medicines with your doctor. Some medicines can make you feel dizzy. This can increase your chance of falling. Ask your doctor what other things that you can do to help prevent falls. This information is not intended to replace advice given to you by your health care provider. Make sure you discuss any questions you have with your health care provider. Document Released: 01/28/2009 Document Revised: 09/09/2015 Document Reviewed: 05/08/2014 Elsevier Interactive Patient Education  2017 [employer].

## 2022-04-04 ENCOUNTER — Encounter: Payer: Self-pay | Admitting: Family Medicine

## 2022-04-04 ENCOUNTER — Ambulatory Visit (INDEPENDENT_AMBULATORY_CARE_PROVIDER_SITE_OTHER): Payer: Managed Care, Other (non HMO) | Admitting: Family Medicine

## 2022-04-04 VITALS — BP 110/72 | HR 73 | Temp 97.2°F | Ht 68.0 in | Wt 286.0 lb

## 2022-04-04 DIAGNOSIS — J329 Chronic sinusitis, unspecified: Secondary | ICD-10-CM

## 2022-04-04 DIAGNOSIS — Z23 Encounter for immunization: Secondary | ICD-10-CM

## 2022-04-04 DIAGNOSIS — Z0000 Encounter for general adult medical examination without abnormal findings: Secondary | ICD-10-CM

## 2022-04-04 DIAGNOSIS — I10 Essential (primary) hypertension: Secondary | ICD-10-CM | POA: Diagnosis not present

## 2022-04-04 DIAGNOSIS — Z7189 Other specified counseling: Secondary | ICD-10-CM

## 2022-04-04 NOTE — Patient Instructions (Signed)
If you notice you are lightheaded after starting CPAP, then stop metoprolol let me know about your BP.   I'll work on the ENT eval.   Take care.  Glad to see you.

## 2022-04-04 NOTE — Progress Notes (Unsigned)
D/w pt about ENT f/u.  CT reviewed with patient.  On astelin and flonase.    He has cataract surgery pending.   He is going to get his CPAP soon.    Hypertension:    Using medication without problems or lightheadedness: yes Chest pain with exertion:no Edema:no Short of breath: only with sig exertion.  We talked about deconditioning, ie getting extra walking.   Labs d/w pt.    Prev AAA screening neg.   Tetanus 2020 Flu 2023 PNA 2021 Shingles prev done.   covid vaccine 2021 HIV and HCV neg prev.   PSA wnl.  Dw pt.  FH noted.   Colonoscopy pending 2023 Living will d/w pt.  Wife designated if patient were incapacitated.    Diet and exercise d/w pt.  Encouraged both, discussed.  Meds, vitals, and allergies reviewed.   PMH and SH reviewed  ROS: Per HPI unless specifically indicated in ROS section   GEN: nad, alert and oriented HEENT: mucous membranes moist NECK: supple w/o LA CV: rrr. PULM: ctab, no inc wob ABD: soft, +bs EXT: no edema SKIN: no acute rash

## 2022-04-05 DIAGNOSIS — J329 Chronic sinusitis, unspecified: Secondary | ICD-10-CM | POA: Insufficient documentation

## 2022-04-05 NOTE — Assessment & Plan Note (Signed)
Labs discussed with patient.  Continue work on diet and exercise.  He is going to get set up with CPAP soon and that may significantly help his situation, in terms of his blood pressure and his energy level, to allow him to exercise more frequently.  If he has any low blood pressure then he can stop his metoprolol and update me.  See after visit summary.  Okay for outpatient follow-up.

## 2022-04-05 NOTE — Assessment & Plan Note (Signed)
Prev AAA screening neg.   Tetanus 2020 Flu 2023 PNA 2021 Shingles prev done.   covid vaccine 2021 HIV and HCV neg prev.   PSA wnl.  Dw pt.  FH noted.   Colonoscopy pending 2023 Living will d/w pt.  Wife designated if patient were incapacitated.    Diet and exercise d/w pt.  Encouraged both, discussed.

## 2022-04-05 NOTE — Assessment & Plan Note (Signed)
Living will d/w pt.  Wife designated if patient were incapacitated.   ?

## 2022-04-05 NOTE — Assessment & Plan Note (Signed)
CT reviewed.  Refer to ENT.

## 2022-04-07 ENCOUNTER — Telehealth: Payer: Managed Care, Other (non HMO) | Admitting: Physician Assistant

## 2022-04-07 DIAGNOSIS — J329 Chronic sinusitis, unspecified: Secondary | ICD-10-CM | POA: Diagnosis not present

## 2022-04-07 DIAGNOSIS — B9689 Other specified bacterial agents as the cause of diseases classified elsewhere: Secondary | ICD-10-CM | POA: Diagnosis not present

## 2022-04-07 MED ORDER — DOXYCYCLINE HYCLATE 100 MG PO TABS: 100.0000 mg | ORAL_TABLET | Freq: Two times a day (BID) | ORAL | 0 refills | Status: DC

## 2022-04-07 NOTE — Progress Notes (Signed)
E-Visit for Sinus Problems  We are sorry that you are not feeling well.  Here is how we plan to help!  Based on what you have shared with me it looks like you have sinusitis.  Sinusitis is inflammation and infection in the sinus cavities of the head.  Based on your presentation I believe you most likely have Acute Bacterial Sinusitis.  This is an infection caused by bacteria and is treated with antibiotics. I have prescribed Doxycycline 100mg by mouth twice a day for 10 days. You may use an oral decongestant such as Mucinex D or if you have glaucoma or high blood pressure use plain Mucinex. Saline nasal spray help and can safely be used as often as needed for congestion.  If you develop worsening sinus pain, fever or notice severe headache and vision changes, or if symptoms are not better after completion of antibiotic, please schedule an appointment with a health care provider.    Sinus infections are not as easily transmitted as other respiratory infection, however we still recommend that you avoid close contact with loved ones, especially the very young and elderly.  Remember to wash your hands thoroughly throughout the day as this is the number one way to prevent the spread of infection!  Home Care: Only take medications as instructed by your medical team. Complete the entire course of an antibiotic. Do not take these medications with alcohol. A steam or ultrasonic humidifier can help congestion.  You can place a towel over your head and breathe in the steam from hot water coming from a faucet. Avoid close contacts especially the very young and the elderly. Cover your mouth when you cough or sneeze. Always remember to wash your hands.  Get Help Right Away If: You develop worsening fever or sinus pain. You develop a severe head ache or visual changes. Your symptoms persist after you have completed your treatment plan.  Make sure you Understand these instructions. Will watch your  condition. Will get help right away if you are not doing well or get worse.  Thank you for choosing an e-visit.  Your e-visit answers were reviewed by a board certified advanced clinical practitioner to complete your personal care plan. Depending upon the condition, your plan could have included both over the counter or prescription medications.  Please review your pharmacy choice. Make sure the pharmacy is open so you can pick up prescription now. If there is a problem, you may contact your provider through MyChart messaging and have the prescription routed to another pharmacy.  Your safety is important to us. If you have drug allergies check your prescription carefully.   For the next 24 hours you can use MyChart to ask questions about today's visit, request a non-urgent call back, or ask for a work or school excuse. You will get an email in the next two days asking about your experience. I hope that your e-visit has been valuable and will speed your recovery.  I have spent 5 minutes in review of e-visit questionnaire, review and updating patient chart, medical decision making and response to patient.   Kortnee Bas M Jaileen Janelle, PA-C  

## 2022-04-14 ENCOUNTER — Ambulatory Visit: Payer: Managed Care, Other (non HMO) | Admitting: Family Medicine

## 2022-04-14 ENCOUNTER — Encounter: Payer: Self-pay | Admitting: Family Medicine

## 2022-04-14 VITALS — BP 122/80 | HR 74 | Temp 97.6°F | Ht 68.0 in | Wt 288.0 lb

## 2022-04-14 DIAGNOSIS — J329 Chronic sinusitis, unspecified: Secondary | ICD-10-CM | POA: Diagnosis not present

## 2022-04-14 MED ORDER — SULFAMETHOXAZOLE-TRIMETHOPRIM 800-160 MG PO TABS: 1.0000 | ORAL_TABLET | Freq: Two times a day (BID) | ORAL | 0 refills | Status: DC

## 2022-04-14 MED ORDER — PREDNISONE 10 MG (21) PO TBPK: ORAL_TABLET | Freq: Every day | ORAL | 0 refills | Status: DC

## 2022-04-14 NOTE — Progress Notes (Unsigned)
Discussed recent events:           07-Apr-2022 E-visit for sinus problems - prescribed 100 mg Doxycycline for 10 days  o        Symptoms onset quickly after December 19 visit o        Medicine helpful but not well - about 80% ? o        still very tired most days - after work and normal activities o        Sleeping more - not refreshed o        Lung aches - breathing issues o        Eyes itch, water, ache - have eyedrops o        Feel like infection is lingering o        Appointment with [facility] on Friday, January 5  o        Next steps?   ========================== Woke up this AM feeling worse.  Will finish doxycycline soon, still on med currently.  He was getting some better on doxy then sx levelled off but not back to baseline. No fevers. Fatigued.  Upper tooth pain B.  Eyes are tearing more.

## 2022-04-14 NOTE — Patient Instructions (Signed)
Stop doxy, start septra, restart prednisone and follow up with ENT.  I'll await your consult note.  Rest and fluids.  Take care.  Glad to see you.

## 2022-04-15 ENCOUNTER — Other Ambulatory Visit: Payer: Self-pay | Admitting: Family Medicine

## 2022-04-16 NOTE — Assessment & Plan Note (Signed)
Not improved on doxycycline. Stop doxy, start septra, restart prednisone and follow up with ENT.  I'll await his consult note.  Rest and fluids.  Okay for outpatient follow-up.  He agrees to plan.

## 2022-05-17 ENCOUNTER — Encounter: Payer: Self-pay | Admitting: Family Medicine

## 2022-05-17 MED ORDER — TADALAFIL 5 MG PO TABS: 2.5000 mg | ORAL_TABLET | Freq: Every day | ORAL | 11 refills | Status: DC

## 2022-05-22 ENCOUNTER — Encounter: Payer: Self-pay | Admitting: Family Medicine

## 2022-05-22 ENCOUNTER — Other Ambulatory Visit: Payer: Self-pay

## 2022-05-22 MED ORDER — HYDROCHLOROTHIAZIDE 12.5 MG PO TABS: 12.5000 mg | ORAL_TABLET | Freq: Every day | ORAL | 1 refills | Status: DC

## 2022-05-22 NOTE — Telephone Encounter (Signed)
Sounds like you are giving it a good try, keep up good work.  Fine to move appointment out till March

## 2022-05-22 NOTE — Telephone Encounter (Signed)
Mychart message sent by pt: Dustin Bellini Ackley "Dustin"  P [facility] (supporting Tammy S Parrett, NP)2 days ago      [employer],   This is a brief update on my beginning use of the CPAP machine.    I received the device on April 18, 2022 and have been working to acclimate my sleep to using the mask and all the other CPAP related tasks.    While I am making progress, I am still challenged to use the device for more than 4-5 hours a night. I should get to a consistent 6 hours usage in the next week or so, although I have some other medical procedures that may that more difficult- colonoscopy on February 8, and cataract surgery on February 14 and February 28.  I am also continuing to take antibiotics for a sinus infection, per my ENT physician.  No excuses, but several things happening at once as it concerns my health!     I do need to be compliant on the use of the CPAP for insurance purposes by the end of March, including my follow-up visit with you.    With that in mind, I'd like to ask you if we can reschedule the February appointment to sometime in mid-March, please.  I can call to get specifics and to try to avoid any conflicts with my work calendar.  Health is the number one priority, but I also need to be considerate of my colleagues and current project scheduling.    Please let me know if any thoughts, and I will plan to call early next week on the appointment.    Thanks,   Dustin [name]    Routing to [place] for review.

## 2022-05-25 ENCOUNTER — Encounter: Payer: Self-pay | Admitting: *Deleted

## 2022-05-26 ENCOUNTER — Encounter: Payer: Self-pay | Admitting: *Deleted

## 2022-05-26 ENCOUNTER — Ambulatory Visit
Admission: RE | Admit: 2022-05-26 | Discharge: 2022-05-26 | Disposition: A | Discharge status: Home / Self Care | Payer: Managed Care, Other (non HMO) | Attending: Gastroenterology | Admitting: Gastroenterology

## 2022-05-26 ENCOUNTER — Ambulatory Visit: Payer: Managed Care, Other (non HMO) | Admitting: Anesthesiology

## 2022-05-26 ENCOUNTER — Encounter
Admission: RE | Disposition: A | Discharge status: Home / Self Care | Payer: Self-pay | Source: Home / Self Care | Attending: Gastroenterology

## 2022-05-26 DIAGNOSIS — Z87891 Personal history of nicotine dependence: Secondary | ICD-10-CM | POA: Insufficient documentation

## 2022-05-26 DIAGNOSIS — E785 Hyperlipidemia, unspecified: Secondary | ICD-10-CM | POA: Insufficient documentation

## 2022-05-26 DIAGNOSIS — K640 First degree hemorrhoids: Secondary | ICD-10-CM | POA: Diagnosis not present

## 2022-05-26 DIAGNOSIS — E669 Obesity, unspecified: Secondary | ICD-10-CM | POA: Insufficient documentation

## 2022-05-26 DIAGNOSIS — Z09 Encounter for follow-up examination after completed treatment for conditions other than malignant neoplasm: Secondary | ICD-10-CM | POA: Insufficient documentation

## 2022-05-26 DIAGNOSIS — Z86010 Personal history of colonic polyps: Secondary | ICD-10-CM | POA: Diagnosis not present

## 2022-05-26 DIAGNOSIS — I10 Essential (primary) hypertension: Secondary | ICD-10-CM | POA: Diagnosis not present

## 2022-05-26 DIAGNOSIS — G4733 Obstructive sleep apnea (adult) (pediatric): Secondary | ICD-10-CM | POA: Diagnosis not present

## 2022-05-26 DIAGNOSIS — D123 Benign neoplasm of transverse colon: Secondary | ICD-10-CM | POA: Insufficient documentation

## 2022-05-26 DIAGNOSIS — D122 Benign neoplasm of ascending colon: Secondary | ICD-10-CM | POA: Diagnosis not present

## 2022-05-26 DIAGNOSIS — K5730 Diverticulosis of large intestine without perforation or abscess without bleeding: Secondary | ICD-10-CM | POA: Diagnosis not present

## 2022-05-26 DIAGNOSIS — Z1211 Encounter for screening for malignant neoplasm of colon: Secondary | ICD-10-CM | POA: Insufficient documentation

## 2022-05-26 HISTORY — PX: COLONOSCOPY WITH PROPOFOL: SHX5780

## 2022-05-26 HISTORY — DX: Sleep apnea, unspecified: G47.30

## 2022-05-26 SURGERY — COLONOSCOPY WITH PROPOFOL
Anesthesia: General

## 2022-05-26 MED ORDER — PHENYLEPHRINE HCL (PRESSORS) 10 MG/ML IV SOLN
INTRAVENOUS | Status: DC | PRN
  Administered 2022-05-26 (×3): 80 ug via INTRAVENOUS

## 2022-05-26 MED ORDER — PROPOFOL 10 MG/ML IV BOLUS
INTRAVENOUS | Status: DC | PRN
  Administered 2022-05-26: 150 ug/kg/min via INTRAVENOUS
  Administered 2022-05-26: 100 mg via INTRAVENOUS

## 2022-05-26 MED ORDER — LIDOCAINE HCL (CARDIAC) PF 100 MG/5ML IV SOSY
PREFILLED_SYRINGE | INTRAVENOUS | Status: DC | PRN
  Administered 2022-05-26: 100 mg via INTRAVENOUS

## 2022-05-26 MED ORDER — SIMETHICONE 40 MG/0.6ML PO SUSP
ORAL | Status: DC | PRN
  Administered 2022-05-26: 240 mL

## 2022-05-26 MED ORDER — SODIUM CHLORIDE 0.9 % IV SOLN: INTRAVENOUS | Status: DC

## 2022-05-26 NOTE — Interval H&P Note (Signed)
History and Physical Interval Note:  05/26/2022 11:07 AM  Dustin [name]  has presented today for surgery, with the diagnosis of personal history adenomatous polyp.  The various methods of treatment have been discussed with the patient and family. After consideration of risks, benefits and other options for treatment, the patient has consented to  Procedure(s): COLONOSCOPY WITH PROPOFOL (N/A) as a surgical intervention.  The patient's history has been reviewed, patient examined, no change in status, stable for surgery.  I have reviewed the patient's chart and labs.  Questions were answered to the patient's satisfaction.     Lesly Rubenstein  Ok to proceed with colonoscopy

## 2022-05-26 NOTE — Transfer of Care (Signed)
Immediate Anesthesia Transfer of Care Note  Patient: Dustin [name]  Procedure(s) Performed: COLONOSCOPY WITH PROPOFOL  Patient Location: PACU  Anesthesia Type:General  Level of Consciousness: drowsy  Airway & Oxygen Therapy: Patient Spontanous Breathing  Post-op Assessment: Report given to RN and Post -op Vital signs reviewed and stable  Post vital signs: Reviewed and stable  Last Vitals:  Vitals Value Taken Time  BP    Temp    Pulse    Resp    SpO2      Last Pain:  Vitals:   05/26/22 1027  TempSrc: Temporal         Complications: No notable events documented.

## 2022-05-26 NOTE — Anesthesia Preprocedure Evaluation (Signed)
Anesthesia Evaluation  Patient identified by MRN, date of birth, ID band Patient awake    Reviewed: Allergy & Precautions, NPO status , Patient's Chart, lab work & pertinent test results  Airway Mallampati: III  TM Distance: >3 FB Neck ROM: full    Dental  (+) Teeth Intact   Pulmonary neg pulmonary ROS, sleep apnea , former smoker   Pulmonary exam normal  + decreased breath sounds      Cardiovascular Exercise Tolerance: Good hypertension, Pt. on medications negative cardio ROS Normal cardiovascular exam Rhythm:Regular Rate:Normal     Neuro/Psych Pt. Passed out this am. Feels totally normal at this time, vital signs stable. negative neurological ROS  negative psych ROS   GI/Hepatic negative GI ROS, Neg liver ROS,,,  Endo/Other  negative endocrine ROS    Renal/GU negative Renal ROS  negative genitourinary   Musculoskeletal negative musculoskeletal ROS (+)    Abdominal  (+) + obese  Peds negative pediatric ROS (+)  Hematology negative hematology ROS (+)   Anesthesia Other Findings Past Medical History: No date: Allergy No date: Hyperlipidemia No date: Hypertension No date: Sleep apnea 06/03/2015: Vertigo     Comment:  1 episode No date: Wears hearing aid in both ears  Past Surgical History: No date: COLONOSCOPY WITH PROPOFOL 02/27/2019: ETHMOIDECTOMY; Bilateral     Comment:  Procedure: ETHMOIDECTOMY;  Surgeon: Margaretha Sheffield, MD;                Location: [facility];  Service: ENT;                Laterality: Bilateral; 02/27/2019: FRONTAL SINUS EXPLORATION; Bilateral     Comment:  Procedure: FRONTAL SINUS EXPLORATION;  Surgeon: Margaretha Sheffield, MD;  Location: [facility];  Service: ENT;               Laterality: Bilateral; 02/27/2019: IMAGE GUIDED SINUS SURGERY; Bilateral     Comment:  Procedure: IMAGE GUIDED SINUS SURGERY;  Surgeon:               Margaretha Sheffield, MD;  Location:  [facility];                Service: ENT;  Laterality: Bilateral; 2005: LASIK; Bilateral     Comment:  Dr Lucita Ferrara 02/27/2019: MAXILLARY ANTROSTOMY; Bilateral     Comment:  Procedure: MAXILLARY ANTROSTOMY WITH TISSUE REMOVAL;                Surgeon: Margaretha Sheffield, MD;  Location: [facility];  Service: ENT;  Laterality: Bilateral; 02/27/2019: SEPTOPLASTY; Bilateral     Comment:  Procedure: SEPTOPLASTY;  Surgeon: Margaretha Sheffield, MD;                Location: [facility];  Service: ENT;                Laterality: Bilateral;  disk put on desk in charge nurse               office, 02/06/2019 ds put 2nd disk on charge nurse desk               10-29 kp 02/27/2019: TURBINATE REDUCTION; Bilateral     Comment:  Procedure: TURBINATE REDUCTION;  Surgeon: Margaretha Sheffield,              MD;  Location: [facility];  Service: ENT;                Laterality: Bilateral; No date: TYMPANOSTOMY TUBE PLACEMENT 04/1995: VASECTOMY     Reproductive/Obstetrics negative OB ROS                             Anesthesia Physical Anesthesia Plan  ASA: 3  Anesthesia Plan: General   Post-op Pain Management:    Induction: Intravenous  PONV Risk Score and Plan: Propofol infusion and TIVA  Airway Management Planned: Natural Airway  Additional Equipment:   Intra-op Plan:   Post-operative Plan:   Informed Consent: I have reviewed the patients History and Physical, chart, labs and discussed the procedure including the risks, benefits and alternatives for the proposed anesthesia with the patient or authorized representative who has indicated his/her understanding and acceptance.     Dental Advisory Given  Plan Discussed with: CRNA and Surgeon  Anesthesia Plan Comments:        Anesthesia Quick Evaluation

## 2022-05-26 NOTE — Op Note (Signed)
[facility] Gastroenterology Patient Name: Dustin [name] Procedure Date: 05/26/2022 11:03 AM MRN: [id number] Account #: [id number] Date of Birth: [birth year] Admit Type: Outpatient Age: 67 Room: [facility] ENDO ROOM 3 Gender: Male Note Status: Finalized Instrument Name: Park Meo [id number] Procedure:             Colonoscopy Indications:           Surveillance: Personal history of adenomatous polyps                         on last colonoscopy > 5 years ago Providers:             Andrey Farmer MD, MD Referring MD:          Elveria Rising. Damita Dunnings, MD (Referring MD) Medicines:             Monitored Anesthesia Care Complications:         No immediate complications. Estimated blood loss:                         Minimal. Procedure:             Pre-Anesthesia Assessment:                        - Prior to the procedure, a History and Physical was                         performed, and patient medications and allergies were                         reviewed. The patient is competent. The risks and                         benefits of the procedure and the sedation options and                         risks were discussed with the patient. All questions                         were answered and informed consent was obtained.                         Patient identification and proposed procedure were                         verified by the physician, the nurse, the                         anesthesiologist, the anesthetist and the technician                         in the endoscopy suite. Mental Status Examination:                         alert and oriented. Airway Examination: normal                         oropharyngeal airway and neck mobility. Respiratory  Examination: clear to auscultation. CV Examination:                         normal. Prophylactic Antibiotics: The patient does not                         require prophylactic antibiotics. Prior                          Anticoagulants: The patient has taken no anticoagulant                         or antiplatelet agents. ASA Grade Assessment: III - A                         patient with severe systemic disease. After reviewing                         the risks and benefits, the patient was deemed in                         satisfactory condition to undergo the procedure. The                         anesthesia plan was to use monitored anesthesia care                         (MAC). Immediately prior to administration of                         medications, the patient was re-assessed for adequacy                         to receive sedatives. The heart rate, respiratory                         rate, oxygen saturations, blood pressure, adequacy of                         pulmonary ventilation, and response to care were                         monitored throughout the procedure. The physical                         status of the patient was re-assessed after the                         procedure.                        After obtaining informed consent, the colonoscope was                         passed under direct vision. Throughout the procedure,                         the patient's blood pressure, pulse, and oxygen  saturations were monitored continuously. The                         Colonoscope was introduced through the anus and                         advanced to the the cecum, identified by appendiceal                         orifice and ileocecal valve. The colonoscopy was                         performed without difficulty. The patient tolerated                         the procedure well. The quality of the bowel                         preparation was good. The ileocecal valve, appendiceal                         orifice, and rectum were photographed. Findings:      The perianal and digital rectal examinations were normal.      A 1 mm polyp was found in the ascending  colon. The polyp was sessile.       The polyp was removed with a jumbo cold forceps. Resection and retrieval       were complete. Estimated blood loss was minimal.      Multiple small-mouthed diverticula were found in the hepatic flexure and       ascending colon.      Two sessile polyps were found in the splenic flexure. The polyps were 2       to 3 mm in size. These polyps were removed with a cold snare. Resection       and retrieval were complete. Estimated blood loss was minimal.      Many small-mouthed diverticula were found in the sigmoid colon and       descending colon.      Internal hemorrhoids were found during retroflexion. The hemorrhoids       were Grade I (internal hemorrhoids that do not prolapse).      The exam was otherwise without abnormality on direct and retroflexion       views. Impression:            - One 1 mm polyp in the ascending colon, removed with                         a jumbo cold forceps. Resected and retrieved.                        - Diverticulosis at the hepatic flexure and in the                         ascending colon.                        - Two 2 to 3 mm polyps at the splenic flexure, removed  with a cold snare. Resected and retrieved.                        - Diverticulosis in the sigmoid colon and in the                         descending colon.                        - Internal hemorrhoids.                        - The examination was otherwise normal on direct and                         retroflexion views. Recommendation:        - Discharge patient to home.                        - Resume previous diet.                        - Continue present medications.                        - Await pathology results.                        - Repeat colonoscopy date to be determined after                         pending pathology results are reviewed for                         surveillance.                        - Return to  referring physician as previously                         scheduled. Procedure Code(s):     --- Professional ---                        [phone], Colonoscopy, flexible; with removal of                         tumor(s), polyp(s), or other lesion(s) by snare                         technique                        45380, 12, Colonoscopy, flexible; with biopsy, single                         or multiple Diagnosis Code(s):     --- Professional ---                        Z86.010, Personal history of colonic polyps                        D12.2, Benign neoplasm of  ascending colon                        K64.0, First degree hemorrhoids                        D12.3, Benign neoplasm of transverse colon (hepatic                         flexure or splenic flexure)                        K57.30, Diverticulosis of large intestine without                         perforation or abscess without bleeding CPT copyright 2022 American Medical Association. All rights reserved. The codes documented in this report are preliminary and upon coder review may  be revised to meet current compliance requirements. Andrey Farmer MD, MD 05/26/2022 11:35:48 AM Number of Addenda: 0 Note Initiated On: 05/26/2022 11:03 AM Scope Withdrawal Time: 0 hours 10 minutes 52 seconds  Total Procedure Duration: 0 hours 16 minutes 2 seconds  Estimated Blood Loss:  Estimated blood loss was minimal.      [facility]

## 2022-05-26 NOTE — Anesthesia Postprocedure Evaluation (Signed)
Anesthesia Post Note  Patient: Dustin [name]  Procedure(s) Performed: COLONOSCOPY WITH PROPOFOL  Patient location during evaluation: PACU Anesthesia Type: General Level of consciousness: awake and awake and alert Pain management: pain level controlled Vital Signs Assessment: post-procedure vital signs reviewed and stable Respiratory status: spontaneous breathing and nonlabored ventilation Cardiovascular status: stable Anesthetic complications: no  There were no known notable events for this encounter.   Last Vitals:  Vitals:   05/26/22 1153 05/26/22 1203  BP: (!) 97/59 96/68  Pulse: 77 75  Resp: 20   Temp:    SpO2: 93% 95%    Last Pain:  Vitals:   05/26/22 1203  TempSrc:   PainSc: 0-No pain                 VAN STAVEREN,Lagretta Loseke

## 2022-05-26 NOTE — H&P (Signed)
Outpatient short stay form Pre-procedure 05/26/2022  Lesly Rubenstein, MD  Primary Physician: Tonia Ghent, MD  Reason for visit:  Surveillance  History of present illness:    67 y/o gentleman with history of hypertension, OSA, and obesity here for surveillance colonoscopy. Last colonoscopy in 2011 with small Ta's. No blood thinners. No family history of GI malignancies. No significant abdominal surgeries.    Current Facility-Administered Medications:    0.9 %  sodium chloride infusion, , Intravenous, Continuous, Antwion Carpenter, Hilton Cork, MD, Last Rate: 20 mL/hr at 05/26/22 1044, New Bag at 05/26/22 1044  Medications Prior to Admission  Medication Sig Dispense Refill Last Dose   hydrochlorothiazide (HYDRODIURIL) 12.5 MG tablet Take 1 tablet (12.5 mg total) by mouth daily. 90 tablet 1 05/25/2022   loratadine (CLARITIN) 10 MG tablet Take 10 mg by mouth daily.   05/25/2022   metoprolol succinate (TOPROL-XL) 50 MG 24 hr tablet TAKE 1 TABLET BY MOUTH DAILY.**TAKE WITHOR IMMEDIATELY FOLLOWING A MEAL** 90 tablet 2 05/25/2022   azelastine (ASTELIN) 0.1 % nasal spray Place 1 spray into both nostrils 2 (two) times daily. Use in each nostril as directed 30 mL 0    fluticasone (FLONASE) 50 MCG/ACT nasal spray USE 1-2 SPRAYS IN BOTH NOSTRILS EVERY DAY 16 g 12    metoprolol succinate (TOPROL-XL) 25 MG 24 hr tablet TAKE 1 TABLET BY MOUTH DAILY.**TAKE WITHOR IMMEDIATELY FOLLOWING A MEAL** 90 tablet 1    predniSONE (STERAPRED UNI-PAK 21 TAB) 10 MG (21) TBPK tablet Take by mouth daily. As directed with food. 21 tablet 0    sulfamethoxazole-trimethoprim (BACTRIM DS) 800-160 MG tablet Take 1 tablet by mouth 2 (two) times daily. 20 tablet 0    tadalafil (CIALIS) 5 MG tablet Take 0.5-1 tablets (2.5-5 mg total) by mouth daily. 30 tablet 11      Allergies  Allergen Reactions   Antihistamines, Diphenhydramine-Type Other (See Comments)    Insomnia with benadryl, but able to tolerate claritin   Erythromycin      REACTION: GI UPSET   Flomax [Tamsulosin]     headache   Guaifenesin & Derivatives Other (See Comments)    Irritable but able to tolerate med if needed.    Metoprolol Other (See Comments)    Max tolerated dose 70m day, not an allergy     Past Medical History:  Diagnosis Date   Allergy    Hyperlipidemia    Hypertension    Sleep apnea    Vertigo 06/03/2015   1 episode   Wears hearing aid in both ears     Review of systems:  Otherwise negative.    Physical Exam  Gen: Alert, oriented. Appears stated age.  HEENT: PERRLA. Lungs: No respiratory distress CV: RRR Abd: soft, benign, no masses Ext: No edema    Planned procedures: Proceed with colonoscopy. The patient understands the nature of the planned procedure, indications, risks, alternatives and potential complications including but not limited to bleeding, infection, perforation, damage to internal organs and possible oversedation/side effects from anesthesia. The patient agrees and gives consent to proceed.  Please refer to procedure notes for findings, recommendations and patient disposition/instructions.     CLesly Rubenstein MD [facility]

## 2022-05-28 NOTE — Progress Notes (Signed)
Voicemail.  No Message Left. 

## 2022-05-29 ENCOUNTER — Encounter: Payer: Self-pay | Admitting: Gastroenterology

## 2022-05-29 LAB — SURGICAL PATHOLOGY

## 2022-06-05 ENCOUNTER — Ambulatory Visit: Payer: Managed Care, Other (non HMO) | Admitting: Adult Health

## 2022-06-07 ENCOUNTER — Ambulatory Visit: Payer: Managed Care, Other (non HMO) | Admitting: Adult Health

## 2022-06-22 ENCOUNTER — Ambulatory Visit: Payer: Managed Care, Other (non HMO) | Admitting: Adult Health

## 2022-06-29 ENCOUNTER — Ambulatory Visit: Payer: Managed Care, Other (non HMO) | Admitting: Adult Health

## 2022-06-29 ENCOUNTER — Encounter: Payer: Self-pay | Admitting: Adult Health

## 2022-06-29 VITALS — BP 128/78 | HR 69 | Temp 97.8°F | Ht 68.0 in | Wt 285.0 lb

## 2022-06-29 DIAGNOSIS — G4733 Obstructive sleep apnea (adult) (pediatric): Secondary | ICD-10-CM | POA: Diagnosis not present

## 2022-06-29 NOTE — Progress Notes (Signed)
$'@Patient'x$  ID: Dustin [name], male    DOB: 09-09-1955, 67 y.o.   MRN: [id number]  Chief Complaint  Patient presents with   Follow-up    Referring provider: Tonia Ghent, MD  HPI: 67 year old male followed for obstructive sleep apnea  TEST/EVENTS :  repeat sleep study that was completed February 15, 2022 that showed moderate sleep apnea with AHI 25.9/hour and SPO2 low at 78%.    Sleep Study 04/04/18 Mild obstructive sleep apnea with an AHI of 6.5 and SpO2 low of 80%.   FENO 07/30/88-21  06/29/2022 Follow  up : OSA  Patient presents for 59-monthfollow-up.  Patient has underlying moderate obstructive sleep apnea.  He had a repeat sleep study in November for 2023 that showed moderate sleep apnea.  He was recommend to start on CPAP.  Patient says he is trying to get used to his CPAP but does not really like it.  Feels very claustrophobic and anxious when he wears it.  Feels that he does not get a good night sleep sometimes he cannot go to sleep when he puts it on sometimes once he has it on he has to take it off because he is not comfortable feels that the mask does not work very well.  He initially started with a nasal mass it did not work very well then went to a fullface mask.  CPAP download shows 62% compliance with daily average usage at 3 hours.  Patient is on auto CPAP 4 to 20 cm H2O.  AHI is 1.9/hour.  Daily average pressure at 9.5 cm H2O.  We discussed multiple ideas to help with CPAP compliance.  Patient will change over to a nasal mask again to see if this might work.  Mask desensitization discussed at home.  Also will adjust CPAP pressure for comfort.   Allergies  Allergen Reactions   Antihistamines, Diphenhydramine-Type Other (See Comments)    Insomnia with benadryl, but able to tolerate claritin   Erythromycin     REACTION: GI UPSET   Flomax [Tamsulosin]     headache   Guaifenesin & Derivatives Other (See Comments)    Irritable but able to tolerate med if needed.     Metoprolol Other (See Comments)    Max tolerated dose '50mg'$  day, not an allergy    Immunization History  Administered Date(s) Administered   Fluad Quad(high Dose 65+) 04/07/2021, 04/04/2022   Influenza Whole 02/11/2013   Influenza, High Dose Seasonal PF 03/29/2020   Influenza,inj,Quad PF,6+ Mos 02/09/2015, 02/02/2016, 01/05/2017, 02/18/2018   Influenza,inj,quad, With Preservative 01/25/2019   Influenza-Unspecified 01/14/2014   Moderna Sars-Covid-2 Vaccination 06/18/2019, 07/16/2019, 04/30/2020   Pneumococcal Polysaccharide-23 03/29/2020   Td 09/30/2001, 11/01/2018   Tdap 12/14/2011   Zoster Recombinat (Shingrix) 12/29/2017, 03/23/2018    Past Medical History:  Diagnosis Date   Allergy    Hyperlipidemia    Hypertension    Sleep apnea    Vertigo 06/03/2015   1 episode   Wears hearing aid in both ears     Tobacco History: Social History   Tobacco Use  Smoking Status Former   Packs/day: 0.50   Years: 4.00   Additional pack years: 0.00   Total pack years: 2.00   Types: Cigarettes   Quit date: 1975   Years since quitting: 49.2  Smokeless Tobacco Never   Counseling given: Not Answered   Outpatient Medications Prior to Visit  Medication Sig Dispense Refill   azelastine (ASTELIN) 0.1 % nasal spray Place 1 spray into both  nostrils 2 (two) times daily. Use in each nostril as directed 30 mL 0   fluticasone (FLONASE) 50 MCG/ACT nasal spray USE 1-2 SPRAYS IN BOTH NOSTRILS EVERY DAY 16 g 12   hydrochlorothiazide (HYDRODIURIL) 12.5 MG tablet Take 1 tablet (12.5 mg total) by mouth daily. 90 tablet 1   loratadine (CLARITIN) 10 MG tablet Take 10 mg by mouth daily.     metoprolol succinate (TOPROL-XL) 25 MG 24 hr tablet TAKE 1 TABLET BY MOUTH DAILY.**TAKE WITHOR IMMEDIATELY FOLLOWING A MEAL** 90 tablet 1   tadalafil (CIALIS) 5 MG tablet Take 0.5-1 tablets (2.5-5 mg total) by mouth daily. 30 tablet 11   metoprolol succinate (TOPROL-XL) 50 MG 24 hr tablet TAKE 1 TABLET BY MOUTH  DAILY.**TAKE WITHOR IMMEDIATELY FOLLOWING A MEAL** 90 tablet 2   predniSONE (STERAPRED UNI-PAK 21 TAB) 10 MG (21) TBPK tablet Take by mouth daily. As directed with food. (Patient not taking: Reported on 06/29/2022) 21 tablet 0   sulfamethoxazole-trimethoprim (BACTRIM DS) 800-160 MG tablet Take 1 tablet by mouth 2 (two) times daily. (Patient not taking: Reported on 06/29/2022) 20 tablet 0   No facility-administered medications prior to visit.     Review of Systems:   Constitutional:   No  weight loss, night sweats,  Fevers, chills,  +fatigue, or  lassitude.  HEENT:   No headaches,  Difficulty swallowing,  Tooth/dental problems, or  Sore throat,                No sneezing, itching, ear ache, nasal congestion, post nasal drip,   CV:  No chest pain,  Orthopnea, PND, swelling in lower extremities, anasarca, dizziness, palpitations, syncope.   GI  No heartburn, indigestion, abdominal pain, nausea, vomiting, diarrhea, change in bowel habits, loss of appetite, bloody stools.   Resp: No shortness of breath with exertion or at rest.  No excess mucus, no productive cough,  No non-productive cough,  No coughing up of blood.  No change in color of mucus.  No wheezing.  No chest wall deformity  Skin: no rash or lesions.  GU: no dysuria, change in color of urine, no urgency or frequency.  No flank pain, no hematuria   MS:  No joint pain or swelling.  No decreased range of motion.  No back pain.    Physical Exam  BP 128/78 (BP Location: Left Arm, Patient Position: Sitting, Cuff Size: Large)   Pulse 69   Temp 97.8 F (36.6 C) (Oral)   Ht '5\' 8"'$  (1.727 m)   Wt 285 lb (129.3 kg)   SpO2 93%   BMI 43.33 kg/m   GEN: A/Ox3; pleasant , NAD, well nourished    HEENT:  [place]/AT,  , NOSE-clear, THROAT-clear, no lesions, no postnasal drip or exudate noted.  Class III MP airway  NECK:  Supple w/ fair ROM; no JVD; normal carotid impulses w/o bruits; no thyromegaly or nodules palpated; no lymphadenopathy.     RESP  Clear  P & A; w/o, wheezes/ rales/ or rhonchi. no accessory muscle use, no dullness to percussion  CARD:  RRR, no m/r/g, no peripheral edema, pulses intact, no cyanosis or clubbing.  GI:   Soft & nt; nml bowel sounds; no organomegaly or masses detected.   Musco: Warm bil, no deformities or joint swelling noted.   Neuro: alert, no focal deficits noted.    Skin: Warm, no lesions or rashes    Lab Results:  CBC    Component Value Date/Time   WBC 6.2 09/23/2019 0840  RBC 4.66 09/23/2019 0840   HGB 14.3 09/23/2019 0840   HCT 43.0 09/23/2019 0840   PLT 245.0 09/23/2019 0840   MCV 92.3 09/23/2019 0840   MCH 30.4 05/25/2015 0759   MCHC 33.3 09/23/2019 0840   RDW 13.7 09/23/2019 0840   LYMPHSABS 1.4 09/23/2019 0840   MONOABS 0.5 09/23/2019 0840   EOSABS 0.1 09/23/2019 0840   BASOSABS 0.0 09/23/2019 0840    BMET    Component Value Date/Time   NA 140 03/29/2022 0816   K 4.3 03/29/2022 0816   CL 99 03/29/2022 0816   CO2 34 (H) 03/29/2022 0816   GLUCOSE 99 03/29/2022 0816   BUN 23 03/29/2022 0816   CREATININE 0.84 03/29/2022 0816   CALCIUM 9.8 03/29/2022 0816   GFRNONAA >60 05/25/2015 0759   GFRAA >60 05/25/2015 0759    BNP No results found for: "BNP"  ProBNP No results found for: "PROBNP"  Imaging: No results found.        No data to display          Lab Results  Component Value Date   NITRICOXIDE 21 07/30/2017        Assessment & Plan:   OSA (obstructive sleep apnea) Moderate obstructive sleep apnea.  Patient is having difficulty tolerating CPAP.  Will adjust CPAP pressure for comfort.  Change to auto CPAP 5 to 10 cm H2O.  Also recommended on mask desensitization at home.  Changed back to a nasal mask to see if this is more comfortable.  If this is not working would recommend a DME mask fitting.  Plan  Patient Instructions  Change back to nasal mask. If this does not work message me and we can refer for mask fitting.  Change CPAP  pressure to 5 to 10cm.  Saline nasal spray Twice daily   Saline nasal gel At bedtime  .  Continue on CPAP at bedtime Goal is to wear all night long for at least 6 or more hours  Work on healthy sleep regimen  Work on healthy weight  Do not drive if sleepy  Follow up in 4-6 months and As needed        [employer], NP 06/29/2022

## 2022-06-29 NOTE — Addendum Note (Signed)
Addended by: Vanessa Barbara on: 06/29/2022 11:41 AM   Modules accepted: Orders

## 2022-06-29 NOTE — Assessment & Plan Note (Signed)
Moderate obstructive sleep apnea.  Patient is having difficulty tolerating CPAP.  Will adjust CPAP pressure for comfort.  Change to auto CPAP 5 to 10 cm H2O.  Also recommended on mask desensitization at home.  Changed back to a nasal mask to see if this is more comfortable.  If this is not working would recommend a DME mask fitting.  Plan  Patient Instructions  Change back to nasal mask. If this does not work message me and we can refer for mask fitting.  Change CPAP pressure to 5 to 10cm.  Saline nasal spray Twice daily   Saline nasal gel At bedtime  .  Continue on CPAP at bedtime Goal is to wear all night long for at least 6 or more hours  Work on healthy sleep regimen  Work on healthy weight  Do not drive if sleepy  Follow up in 4-6 months and As needed

## 2022-06-29 NOTE — Patient Instructions (Addendum)
Change back to nasal mask. If this does not work message me and we can refer for mask fitting.  Change CPAP pressure to 5 to 10cm.  Saline nasal spray Twice daily   Saline nasal gel At bedtime  .  Continue on CPAP at bedtime Goal is to wear all night long for at least 6 or more hours  Work on healthy sleep regimen  Work on healthy weight  Do not drive if sleepy  Follow up in 4-6 months and As needed

## 2022-07-01 ENCOUNTER — Telehealth: Payer: Managed Care, Other (non HMO) | Admitting: Family Medicine

## 2022-07-01 DIAGNOSIS — J019 Acute sinusitis, unspecified: Secondary | ICD-10-CM

## 2022-07-01 DIAGNOSIS — B9689 Other specified bacterial agents as the cause of diseases classified elsewhere: Secondary | ICD-10-CM | POA: Diagnosis not present

## 2022-07-01 MED ORDER — AMOXICILLIN-POT CLAVULANATE 875-125 MG PO TABS: 1.0000 | ORAL_TABLET | Freq: Two times a day (BID) | ORAL | 0 refills | Status: DC

## 2022-07-01 MED ORDER — PREDNISONE 20 MG PO TABS: 20.0000 mg | ORAL_TABLET | Freq: Two times a day (BID) | ORAL | 0 refills | Status: AC

## 2022-07-01 NOTE — Progress Notes (Signed)
E-Visit for Sinus Problems  We are sorry that you are not feeling well.  Here is how we plan to help!  Based on what you have shared with me it looks like you have sinusitis.  Sinusitis is inflammation and infection in the sinus cavities of the head.  Based on your presentation I believe you most likely have Acute Bacterial Sinusitis.  This is an infection caused by bacteria and is treated with antibiotics. I have prescribed Augmentin 875mg /125mg  one tablet twice daily with food, for 7 days. I am also sending prednisone.  You may use an oral decongestant such as Mucinex D or if you have glaucoma or high blood pressure use plain Mucinex. Saline nasal spray help and can safely be used as often as needed for congestion.  If you develop worsening sinus pain, fever or notice severe headache and vision changes, or if symptoms are not better after completion of antibiotic, please schedule an appointment with a health care provider.    Sinus infections are not as easily transmitted as other respiratory infection, however we still recommend that you avoid close contact with loved ones, especially the very young and elderly.  Remember to wash your hands thoroughly throughout the day as this is the number one way to prevent the spread of infection!  Home Care: Only take medications as instructed by your medical team. Complete the entire course of an antibiotic. Do not take these medications with alcohol. A steam or ultrasonic humidifier can help congestion.  You can place a towel over your head and breathe in the steam from hot water coming from a faucet. Avoid close contacts especially the very young and the elderly. Cover your mouth when you cough or sneeze. Always remember to wash your hands.  Get Help Right Away If: You develop worsening fever or sinus pain. You develop a severe head ache or visual changes. Your symptoms persist after you have completed your treatment plan.  Make sure you Understand  these instructions. Will watch your condition. Will get help right away if you are not doing well or get worse.  Thank you for choosing an e-visit.  Your e-visit answers were reviewed by a board certified advanced clinical practitioner to complete your personal care plan. Depending upon the condition, your plan could have included both over the counter or prescription medications.  Please review your pharmacy choice. Make sure the pharmacy is open so you can pick up prescription now. If there is a problem, you may contact your provider through [employer] and have the prescription routed to another pharmacy.  Your safety is important to [place]. If you have drug allergies check your prescription carefully.   For the next 24 hours you can use MyChart to ask questions about today's visit, request a non-urgent call back, or ask for a work or school excuse. You will get an email in the next two days asking about your experience. I hope that your e-visit has been valuable and will speed your recovery.    have provided 5 minutes of non face to face time during this encounter for chart review and documentation.

## 2022-07-10 ENCOUNTER — Encounter: Payer: Self-pay | Admitting: Family Medicine

## 2022-07-13 ENCOUNTER — Other Ambulatory Visit: Payer: Self-pay | Admitting: Family Medicine

## 2022-07-13 DIAGNOSIS — J329 Chronic sinusitis, unspecified: Secondary | ICD-10-CM

## 2022-07-13 MED ORDER — AMOXICILLIN-POT CLAVULANATE 875-125 MG PO TABS: 1.0000 | ORAL_TABLET | Freq: Two times a day (BID) | ORAL | 0 refills | Status: DC

## 2022-07-17 ENCOUNTER — Ambulatory Visit: Payer: Managed Care, Other (non HMO) | Admitting: Family Medicine

## 2022-07-19 ENCOUNTER — Other Ambulatory Visit: Payer: Self-pay | Admitting: Family Medicine

## 2022-07-21 ENCOUNTER — Telehealth: Payer: Self-pay | Admitting: Family Medicine

## 2022-07-21 NOTE — Telephone Encounter (Signed)
Patient called in to let Dr Para March know that he wont be able to get into the ENT until 09/14/2202. He said that the antibiotic that he was prescribed is doing just fine and working for him.

## 2022-07-21 NOTE — Telephone Encounter (Signed)
Called patient and let him know, he states he is on the wait list already

## 2022-07-21 NOTE — Telephone Encounter (Signed)
Noted. Please see if he can get on the cancellation list.  Thanks.

## 2022-08-07 ENCOUNTER — Telehealth: Payer: Self-pay | Admitting: Family Medicine

## 2022-08-07 MED ORDER — AMOXICILLIN-POT CLAVULANATE 875-125 MG PO TABS: 1.0000 | ORAL_TABLET | Freq: Two times a day (BID) | ORAL | 0 refills | Status: DC

## 2022-08-07 NOTE — Telephone Encounter (Signed)
I would keep the [facility] OV as scheduled.  I resent augmentin rx in the meantime.  Thanks.

## 2022-08-07 NOTE — Telephone Encounter (Signed)
Please check with patient.  When is he going to see [facility]?  Has he seen them recently?  I didn't seen any notes from that clinic yet.    Please let me know about that as it may change the plan for the patient.  Thanks.

## 2022-08-07 NOTE — Telephone Encounter (Signed)
Patient contacted the office stating he is having the same sinus infection type symptoms, congestion, ear pain, sore throat, fatigue and headache. States this is on going and he has been seen a few times for this same issue. States he has also been working with ENT and was prescribed an antibiotic nasal rinse and has seen no improvement, Would like a call back with how to proceed, please advise [phone].

## 2022-08-07 NOTE — Telephone Encounter (Signed)
Patient has been notified to keep ENT appt and that [facility].

## 2022-08-07 NOTE — Telephone Encounter (Signed)
Patient states he will not see [facility] until May 20th. The last time he has seen [facility] was maybe a month or two ago.

## 2022-08-10 ENCOUNTER — Other Ambulatory Visit: Payer: Self-pay | Admitting: Family Medicine

## 2022-08-12 ENCOUNTER — Telehealth: Payer: Managed Care, Other (non HMO) | Admitting: Family Medicine

## 2022-08-12 DIAGNOSIS — J301 Allergic rhinitis due to pollen: Secondary | ICD-10-CM | POA: Diagnosis not present

## 2022-08-12 MED ORDER — PREDNISONE 20 MG PO TABS: 20.0000 mg | ORAL_TABLET | Freq: Two times a day (BID) | ORAL | 0 refills | Status: AC

## 2022-08-12 NOTE — Progress Notes (Signed)
E visit for Allergic Rhinitis We are sorry that you are not feeling well.  Here is how we plan to help!  Based on what you have shared with me it looks like you have Allergic Rhinitis.  Rhinitis is when a reaction occurs that causes nasal congestion, runny nose, sneezing, and itching.  Most types of rhinitis are caused by an inflammation and are associated with symptoms in the eyes ears or throat. There are several types of rhinitis.  The most common are acute rhinitis, which is usually caused by a viral illness, allergic or seasonal rhinitis, and nonallergic or year-round rhinitis.  Nasal allergies occur certain times of the year.  Allergic rhinitis is caused when allergens in the air trigger the release of histamine in the body.  Histamine causes itching, swelling, and fluid to build up in the fragile linings of the nasal passages, sinuses and eyelids.  An itchy nose and clear discharge are common.  I recommend the following over the counter treatments: You should take a daily dose of antihistamine  I also would recommend a nasal spray: Flonase 2 sprays into each nostril once daily  You may also benefit from eye drops such as: Visine 1-2 drops each eye twice daily as needed  I am also sending prednisone to add to your current regimen. Follow up with ENT as scheduled.  HOME CARE:  You can use an over-the-counter saline nasal spray as needed Avoid areas where there is heavy dust, mites, or molds Stay indoors on windy days during the pollen season Keep windows closed in home, at least in bedroom; use air conditioner. Use high-efficiency house air filter Keep windows closed in car, turn AC on re-circulate Avoid playing out with dog during pollen season  GET HELP RIGHT AWAY IF:  If your symptoms do not improve within 10 days You become short of breath You develop yellow or green discharge from your nose for over 3 days You have coughing fits  MAKE SURE YOU:  Understand these  instructions Will watch your condition Will get help right away if you are not doing well or get worse  Thank you for choosing an e-visit. Your e-visit answers were reviewed by a board certified advanced clinical practitioner to complete your personal care plan. Depending upon the condition, your plan could have included both over the counter or prescription medications. Please review your pharmacy choice. Be sure that the pharmacy you have chosen is open so that you can pick up your prescription now.  If there is a problem you may message your provider in MyChart to have the prescription routed to another pharmacy. Your safety is important to [place]. If you have drug allergies check your prescription carefully.  For the next 24 hours, you can use MyChart to ask questions about today's visit, request a non-urgent call back, or ask for a work or school excuse from your e-visit provider. You will get an email in the next two days asking about your experience. I hope that your e-visit has been valuable and will speed your recovery.     have provided 5 minutes of non face to face time during this encounter for chart review and documentation.

## 2022-08-21 ENCOUNTER — Telehealth: Payer: Managed Care, Other (non HMO) | Admitting: Physician Assistant

## 2022-08-21 DIAGNOSIS — J0141 Acute recurrent pansinusitis: Secondary | ICD-10-CM | POA: Diagnosis not present

## 2022-08-21 MED ORDER — MONTELUKAST SODIUM 10 MG PO TABS: 10.0000 mg | ORAL_TABLET | Freq: Every day | ORAL | 0 refills | Status: DC

## 2022-08-21 MED ORDER — PREDNISONE 20 MG PO TABS: 40.0000 mg | ORAL_TABLET | Freq: Every day | ORAL | 0 refills | Status: DC

## 2022-08-21 NOTE — Progress Notes (Signed)
E-Visit for Sinus Problems  We are sorry that you are not feeling well.  Here is how we plan to help!  Based on what you have shared with me it looks like you have sinusitis.  Sinusitis is inflammation and infection in the sinus cavities of the head.  Based on your presentation I believe you most likely have Acute Viral Sinusitis.This is an infection most likely caused by a virus. There is not specific treatment for viral sinusitis other than to help you with the symptoms until the infection runs its course.  You may use an oral decongestant such as Mucinex D or if you have glaucoma or high blood pressure use plain Mucinex. Saline nasal spray help and can safely be used as often as needed for congestion, I have prescribed: Montelukast 10mg  Take 1 tablet nightly at bedtime. I have also prescribed Prednisone 20mg  Take 2 tablets (40mg ) daily for 7 days.   Some authorities believe that zinc sprays or the use of Echinacea may shorten the course of your symptoms.  Sinus infections are not as easily transmitted as other respiratory infection, however we still recommend that you avoid close contact with loved ones, especially the very young and elderly.  Remember to wash your hands thoroughly throughout the day as this is the number one way to prevent the spread of infection!  Home Care: Only take medications as instructed by your medical team. Do not take these medications with alcohol. A steam or ultrasonic humidifier can help congestion.  You can place a towel over your head and breathe in the steam from hot water coming from a faucet. Avoid close contacts especially the very young and the elderly. Cover your mouth when you cough or sneeze. Always remember to wash your hands.  Get Help Right Away If: You develop worsening fever or sinus pain. You develop a severe head ache or visual changes. Your symptoms persist after you have completed your treatment plan.  Make sure you Understand these  instructions. Will watch your condition. Will get help right away if you are not doing well or get worse.   Thank you for choosing an e-visit.  Your e-visit answers were reviewed by a board certified advanced clinical practitioner to complete your personal care plan. Depending upon the condition, your plan could have included both over the counter or prescription medications.  Please review your pharmacy choice. Make sure the pharmacy is open so you can pick up prescription now. If there is a problem, you may contact your provider through [employer] and have the prescription routed to another pharmacy.  Your safety is important to [place]. If you have drug allergies check your prescription carefully.   For the next 24 hours you can use MyChart to ask questions about today's visit, request a non-urgent call back, or ask for a work or school excuse. You will get an email in the next two days asking about your experience. I hope that your e-visit has been valuable and will speed your recovery.  I have spent 5 minutes in review of e-visit questionnaire, review and updating patient chart, medical decision making and response to patient.   Margaretann Loveless, PA-C

## 2022-08-28 ENCOUNTER — Other Ambulatory Visit: Payer: Self-pay

## 2022-08-28 MED ORDER — METOPROLOL SUCCINATE ER 25 MG PO TB24: ORAL_TABLET | ORAL | 1 refills | Status: DC

## 2022-09-04 DIAGNOSIS — J341 Cyst and mucocele of nose and nasal sinus: Secondary | ICD-10-CM | POA: Insufficient documentation

## 2022-09-05 ENCOUNTER — Telehealth: Payer: Managed Care, Other (non HMO) | Admitting: Family Medicine

## 2022-09-05 DIAGNOSIS — J329 Chronic sinusitis, unspecified: Secondary | ICD-10-CM

## 2022-09-05 NOTE — Progress Notes (Signed)
Given the amount of antibiotics and prednisone you have had- we will need to have you seen in person (face to face) this time for eval and treatment.    NOTE: There will be NO CHARGE for this eVisit

## 2022-09-06 ENCOUNTER — Encounter: Payer: Self-pay | Admitting: Family Medicine

## 2022-09-07 ENCOUNTER — Encounter: Payer: Self-pay | Admitting: Family Medicine

## 2022-09-07 ENCOUNTER — Ambulatory Visit (INDEPENDENT_AMBULATORY_CARE_PROVIDER_SITE_OTHER): Payer: Managed Care, Other (non HMO) | Admitting: Family Medicine

## 2022-09-07 VITALS — BP 122/80 | HR 74 | Temp 98.0°F | Ht 68.0 in | Wt 288.0 lb

## 2022-09-07 DIAGNOSIS — J329 Chronic sinusitis, unspecified: Secondary | ICD-10-CM | POA: Diagnosis not present

## 2022-09-07 DIAGNOSIS — J0141 Acute recurrent pansinusitis: Secondary | ICD-10-CM

## 2022-09-07 MED ORDER — MONTELUKAST SODIUM 10 MG PO TABS: 10.0000 mg | ORAL_TABLET | Freq: Every day | ORAL | 3 refills | Status: DC

## 2022-09-07 NOTE — Progress Notes (Signed)
Off abx currently.  Most recent rx was for singular- ongoing- and he is off prednisone now.  Used OTC phenylephrine and that seemed to help.    He is using nasal saline BID now.  D/w pt about possibly using nasal abx rinse per ENT.   Recent ENT OV d/w pt.   Meds, vitals, and allergies reviewed.   ROS: Per HPI unless specifically indicated in ROS section   Nad Ncat Neck supple, no LA Rrr Ctab Abd soft Skin well-perfused. OP wnl Nasal exam slightly stuffy.

## 2022-09-07 NOTE — Patient Instructions (Signed)
Keep taking singulair and using nasal saline.  Phenylephrine if needed.  Update me as needed.  Take care.  Glad to see you.

## 2022-09-11 NOTE — Assessment & Plan Note (Signed)
Discussed options. At this point would keep taking singulair and using nasal saline.  Phenylephrine if needed.  He can update me as needed.

## 2022-09-15 ENCOUNTER — Other Ambulatory Visit: Payer: Self-pay | Admitting: Family Medicine

## 2022-09-15 DIAGNOSIS — I10 Essential (primary) hypertension: Secondary | ICD-10-CM

## 2022-09-15 DIAGNOSIS — Z9189 Other specified personal risk factors, not elsewhere classified: Secondary | ICD-10-CM

## 2022-09-21 ENCOUNTER — Ambulatory Visit
Admission: RE | Admit: 2022-09-21 | Discharge: 2022-09-21 | Disposition: A | Discharge status: Home / Self Care | Payer: Managed Care, Other (non HMO) | Source: Ambulatory Visit | Attending: Family Medicine | Admitting: Family Medicine

## 2022-09-21 ENCOUNTER — Encounter: Payer: Self-pay | Admitting: Family Medicine

## 2022-09-21 DIAGNOSIS — Z9189 Other specified personal risk factors, not elsewhere classified: Secondary | ICD-10-CM | POA: Insufficient documentation

## 2022-09-21 DIAGNOSIS — I10 Essential (primary) hypertension: Secondary | ICD-10-CM | POA: Insufficient documentation

## 2022-09-24 ENCOUNTER — Other Ambulatory Visit: Payer: Self-pay | Admitting: Family Medicine

## 2022-09-24 ENCOUNTER — Encounter: Payer: Self-pay | Admitting: Family Medicine

## 2022-09-24 DIAGNOSIS — I2510 Atherosclerotic heart disease of native coronary artery without angina pectoris: Secondary | ICD-10-CM | POA: Insufficient documentation

## 2022-09-24 MED ORDER — ASPIRIN 81 MG PO TBEC: 81.0000 mg | DELAYED_RELEASE_TABLET | Freq: Every day | ORAL | Status: AC

## 2022-09-24 MED ORDER — ATORVASTATIN CALCIUM 20 MG PO TABS: 20.0000 mg | ORAL_TABLET | Freq: Every day | ORAL | 3 refills | Status: DC

## 2022-09-27 ENCOUNTER — Other Ambulatory Visit: Payer: Self-pay

## 2022-09-27 DIAGNOSIS — J0141 Acute recurrent pansinusitis: Secondary | ICD-10-CM

## 2022-09-27 MED ORDER — MONTELUKAST SODIUM 10 MG PO TABS
10.0000 mg | ORAL_TABLET | Freq: Every day | ORAL | 3 refills | Status: DC
Start: 2022-09-27 — End: 2023-09-24

## 2022-09-27 NOTE — Progress Notes (Signed)
Rx needs to be resent to [facility].   Rx resent

## 2022-10-02 ENCOUNTER — Encounter: Payer: Self-pay | Admitting: Family Medicine

## 2022-10-07 ENCOUNTER — Telehealth: Payer: Managed Care, Other (non HMO) | Admitting: Nurse Practitioner

## 2022-10-07 DIAGNOSIS — H6990 Unspecified Eustachian tube disorder, unspecified ear: Secondary | ICD-10-CM | POA: Diagnosis not present

## 2022-10-07 MED ORDER — FLUTICASONE PROPIONATE 50 MCG/ACT NA SUSP: 2.0000 | Freq: Every day | NASAL | 6 refills | Status: DC

## 2022-10-07 NOTE — Progress Notes (Signed)
E-Visit for Ear Pain - Eustachian Tube Dysfunction   We are sorry that you are not feeling well. Here is how we plan to help!  Based on what you have shared with me it looks like you have Eustachian Tube Dysfunction.  Eustachian Tube Dysfunction is a condition where the tubes that connect your middle ears to your upper throat become blocked. This can lead to discomfort, hearing difficulties and a feeling of fullness in your ear. Eustachian tube dysfunction usually resolves itself in a few days. The usual symptoms include: Hearing problems Tinnitus, or ringing in your ears Clicking or popping sounds A feeling of fullness in your ears Pain that mimics an ear infection Dizziness, vertigo or balance problems A "tickling" sensation in your ears  ?Eustachian tube dysfunction symptoms may get worse in higher altitudes. This is called barotrauma, and it can happen while scuba diving, flying in an airplane or driving in the mountains.   What causes eustachian tube dysfunction? Allergies and infections (like the common cold and the flu) are the most common causes of eustachian tube dysfunction. These conditions can cause inflammation and mucus buildup, leading to blockage. GERD, or chronic acid reflux, can also cause ETD. This is because stomach acid can back up into your throat and result in inflammation. As mentioned above, altitude changes can also cause ETD.   What are some common eustachian tube dysfunction treatments? In most cases, treatment isn't necessary because ETD often resolves on its own. However, you might need treatment if your symptoms linger for more than two weeks.    Eustachian tube dysfunction treatment depends on the cause and the severity of your condition. Treatments may include home remedies, medications or, in severe cases, surgery.     HOME CARE: Sometimes simple home remedies can help with mild cases of eustachian tube dysfunction. To try and clear the blockage, you  can: Chew gum. Yawn. Swallow. Try the Valsalva maneuver (breathing out forcefully while closing your mouth and pinching your nostrils). Use a saline spray to clear out nasal passages.  MEDICATIONS: Over-the-counter medications can help if allergies are causing eustachian tube dysfunction. Try antihistamines (like cetirizine or diphenhydramine) to ease your symptoms. If you have discomfort, pain relievers -- such as acetaminophen or ibuprofen -- can help.  Sometimes intranasal glucocorticosteroids (like Flonase or Nasacort) help.  I have prescribed Fluticasone 50 mcg/spray 2 sprays in each nostril daily for 10-14 days    GET HELP RIGHT AWAY IF: Fever is over 102.2 degrees. You develop progressive ear pain or hearing loss. Ear symptoms persist longer than 3 days after treatment.  MAKE SURE YOU: Understand these instructions. Will watch your condition. Will get help right away if you are not doing well or get worse.  Thank you for choosing an e-visit.  Your e-visit answers were reviewed by a board certified advanced clinical practitioner to complete your personal care plan. Depending upon the condition, your plan could have included both over the counter or prescription medications.  Please review your pharmacy choice. Make sure the pharmacy is open so you can pick up the prescription now. If there is a problem, you may contact your provider through MyChart messaging and have the prescription routed to another pharmacy.  Your safety is important to us. If you have drug allergies check your prescription carefully.   For the next 24 hours you can use MyChart to ask questions about today's visit, request a non-urgent call back, or ask for a work or school excuse. You will   get an email with a survey after your eVisit asking about your experience. We would appreciate your feedback. I hope that your e-visit has been valuable and will aid in your recovery.   Mary-Margaret Albirtha Grinage, FNP   5-10  minutes spent reviewing and documenting in chart.    

## 2022-10-09 ENCOUNTER — Encounter: Payer: Self-pay | Admitting: Family Medicine

## 2022-10-10 ENCOUNTER — Other Ambulatory Visit: Payer: Self-pay | Admitting: Family Medicine

## 2022-10-11 NOTE — Telephone Encounter (Signed)
Is it possible to get [facility] approved for this patient, given that he has documented cardiac disease?  Many thanks.

## 2022-10-12 ENCOUNTER — Other Ambulatory Visit: Payer: Self-pay | Admitting: Pharmacist

## 2022-10-12 DIAGNOSIS — I25118 Atherosclerotic heart disease of native coronary artery with other forms of angina pectoris: Secondary | ICD-10-CM

## 2022-10-12 MED ORDER — SEMAGLUTIDE-WEIGHT MANAGEMENT 0.5 MG/0.5ML ~~LOC~~ SOAJ: 0.5000 mg | SUBCUTANEOUS | 2 refills | Status: DC

## 2022-10-12 MED ORDER — SEMAGLUTIDE-WEIGHT MANAGEMENT 0.25 MG/0.5ML ~~LOC~~ SOAJ
0.2500 mg | SUBCUTANEOUS | 2 refills | Status: DC
Start: 2022-10-12 — End: 2022-11-17

## 2022-10-12 MED ORDER — SEMAGLUTIDE-WEIGHT MANAGEMENT 1 MG/0.5ML ~~LOC~~ SOAJ
1.0000 mg | SUBCUTANEOUS | 2 refills | Status: DC
Start: 2022-12-09 — End: 2022-11-17

## 2022-10-12 MED ORDER — SEMAGLUTIDE-WEIGHT MANAGEMENT 2.4 MG/0.75ML ~~LOC~~ SOAJ: 2.4000 mg | SUBCUTANEOUS | 2 refills | Status: DC

## 2022-10-12 MED ORDER — SEMAGLUTIDE-WEIGHT MANAGEMENT 1.7 MG/0.75ML ~~LOC~~ SOAJ
1.7000 mg | SUBCUTANEOUS | 2 refills | Status: DC
Start: 2023-01-07 — End: 2022-11-17

## 2022-10-12 NOTE — Progress Notes (Signed)
10/12/2022 Name: Dustin [name] MRN: [id number] DOB: 11-Sep-1955  Chief Complaint  Patient presents with   Medication Management    Dustin [name] is a 67 y.o. year old male who presented for a telephone visit.   They were referred to the pharmacist by their PCP for assistance in managing medication access.    Subjective:  Care Team: Primary Care Provider: Joaquim Nam, MD ; Next Scheduled Visit: 04/06/23 Cardiologist: Dustin [name]; Next Scheduled Visit: 10/13/22  Medication Access/Adherence  Current Pharmacy:  [facility] - [place], [place] - [name] [place] [address] Phone: [phone] Fax: [phone]  [facility] - [place], [place] - [address] AT Portal to [facility] [address] [place] [address] Phone: [phone] Fax: [phone]   Patient reports affordability concerns with their medications: No  Patient reports access/transportation concerns to their pharmacy: No  Patient reports adherence concerns with their medications:  No     Hyperlipidemia/ASCVD Risk Reduction  Current lipid lowering medications: atorvastatin 20 mg daily Medications tried in the past:   Antiplatelet regimen: aspirin 81 mg daily   Obesity (BMI 43.8), Complicated by Coronary Artery Calcium Score of 3303 (equivalent of ASCVD), OSA  Current medications: none, inquires about Wegovy vs Zepbound today given OSA, cardiovascular risk   Physical activity: walks daily, gardening; plans to increase physical activity.   Prior trials of behavioral modification/reduced caloric diet/increased physical activity have been ineffective   Appointment tomorrow with Dr. Mariah [name]   Hypertension:  Current medications: hydrochlorothiazide 12.5 mg daily, metoprolol succinate 25 mg daily    Objective: EXAM: Coronary Calcium Score   TECHNIQUE: The patient was scanned on a Siemens Somatom scanner.  Axial non-contrast 3 mm slices were carried out through the heart. The data set was analyzed on a dedicated work station and scored using the Agatson method.   FINDINGS: Non-cardiac: See separate report from [facility].   Ascending Aorta: Normal size   Pericardium: Normal   Coronary arteries: Normal origin of left and right coronary arteries. Distribution of arterial calcifications if present, as noted below;   LM 0   LAD 1039   LCx 655   RCA 1609   Total 3303   IMPRESSION AND RECOMMENDATION: 1. Coronary calcium score of 3303. This was 98th percentile for age and sex matched control.   2. CAC >300 in LAD, LCx, RCA. CAC-DRS A3/N3   3. Recommend aspirin and statin if no contraindication.   4. Recommend cardiology consultation.   5. Continue heart healthy lifestyle and risk factor modification.   Electronically Signed: By: Dustin [name] M.D. On: 09/22/2022 10:42  Lab Results  Component Value Date   CREATININE 0.84 03/29/2022   BUN 23 03/29/2022   NA 140 03/29/2022   K 4.3 03/29/2022   CL 99 03/29/2022   CO2 34 (H) 03/29/2022    Lab Results  Component Value Date   CHOL 186 03/29/2022   HDL 55.20 03/29/2022   LDLCALC 118 (H) 03/29/2022   TRIG 66.0 03/29/2022   CHOLHDL 3 03/29/2022    Medications Reviewed Today     Reviewed by Dustin [name], RPH-CPP (Pharmacist) on 10/12/22 at [phone]  Med List Status: <None>   Medication Order Taking? Sig Documenting Provider Last Dose Status Informant  aspirin EC 81 MG tablet [id number]  Take 1 tablet (81 mg total) by mouth daily. Swallow whole. Dustin Nam, MD  Active   atorvastatin (LIPITOR) 20 MG  tablet [id number]  Take 1 tablet (20 mg total) by mouth daily. Dustin Nam, MD  Active   azelastine (ASTELIN) 0.1 % nasal spray [id number] No Place 1 spray into both nostrils 2 (two) times daily. Use in each nostril as directed Dustin Loveless, PA-C Taking Active   fluticasone (FLONASE) 50  MCG/ACT nasal spray [id number]  Place 2 sprays into both nostrils daily. Dustin [name], Mary-Margaret, [name]  Active   hydrochlorothiazide (HYDRODIURIL) 12.5 MG tablet [id number]  TAKE 1 TABLET DAILY Dustin Nam, MD  Active   loratadine (CLARITIN) 10 MG tablet [id number] No Take 10 mg by mouth daily. [provider] Taking Active   metoprolol succinate (TOPROL-XL) 25 MG 24 hr tablet [id number] No TAKE 1 TABLET BY MOUTH DAILY. *TAKE WITHOR IMMEDIATELY FOLLOWING A MEAL* Dustin Nam, MD Taking Active   montelukast (SINGULAIR) 10 MG tablet [id number]  Take 1 tablet (10 mg total) by mouth at bedtime. Dustin Nam, MD  Active   tadalafil (CIALIS) 5 MG tablet [id number] No Take 0.5-1 tablets (2.5-5 mg total) by mouth daily. Dustin Nam, MD Taking Active               Assessment/Plan:   Hyperlipidemia/ASCVD Risk Reduction: - Currently uncontrolled, recommend more stringent goal of <70. Defer to cardiology appointment tomorrow.  - Reviewed long term complications of uncontrolled cholesterol   Obesity (BMI 43.8) complicated by ASCVD risk equivalent CAD - Currently unable to achieve goal weight loss of 5-10% through diet and lifestyle modifications alone - Recommend dietary focus on lean proteins, whole grains, vegetables.  - Recommend goal of at least 150 minutes of moderate intensity physical activity weekly.  - Recommend to pursue Wegovy coverage for obesity + ASCVD. No history MEN2, medullary thyroid cancer, pancreatitis, s/p cholecystectomy. PA completed today for [facility] therapy. Counseling on injection technique, mechanism of action, side effects.  Addendum: PA approved for [facility]. Orders placed under Dr. Para March for [place].   Follow Up Plan: follow up in 4 weeks for tolerability/titration  Dustin [name], PharmD, BCACP, CPP Clinical Pharmacist [facility] [phone]

## 2022-10-12 NOTE — Patient Instructions (Addendum)
Mr. Tollison,  It was great talking to you today!  Start Wegovy 0.25 mg weekly. This medication may cause stomach upset, queasiness, or constipation, especially when first starting. This generally improves over time. Call our office if these symptoms occur and worsen, or if you have severe symptoms such as vomiting, diarrhea, or stomach pain.   Here is the website for the savings card. I misspoke, it actually brings the copay down to $0.  http://potts.com/.html  Here is the step by step on how to inject:   [email or web address]  Please reach out with any questions. Thanks!  [name], PharmD, BCACP, CPP Clinical Pharmacist [facility] [phone]

## 2022-10-12 NOTE — Progress Notes (Signed)
Cardiology Office Note  Date:  10/13/2022   ID:  Dustin [name], DOB 18-Jun-1955, MRN [id number]  PCP:  Joaquim Nam, MD   Chief Complaint  Patient presents with   New Patient (Initial Visit)    Ref by Dr. Para March for Aortic Atherosclerosis shown on CT calcium score test. Medications reviewed by the patient verbally. Patient c/o shortness of breath with over exertion and tiredness/fatigue.     HPI:  Dustin [name] is a 67 year old gentleman with past medical history of Sleep apnea Essential hypertension Hyperlipidemia Coronary calcification on CT scan Former smoker, no diabetes Who presents by referral from Dr. Para March for evaluation of his coronary calcification  CT scan performed September 21, 2022 Images pulled up and reviewed with him in detail Aortic atherosclerosis Calcium score 3300 LAD 1039 LCx 655 RCA 1609 Total 3303  Lab work reviewed Total cholesterol 186 LDL 118 Currently on Lipitor 20  Sedentary, no walking Has walked twice recently, reports endurance is low, wonders if it is cardiac issue  Father with CAD, MI age 15s Uncle with aorta aneurysm Brother 46 MI, CAD  EKG personally reviewed by myself on todays visit EKG Interpretation Date/Time:  Friday October 13 2022 08:32:46 EDT Ventricular Rate:  68 PR Interval:  162 QRS Duration:  104 QT Interval:  414 QTC Calculation: 440 R Axis:   -3  Text Interpretation: Normal sinus rhythm Incomplete right bundle branch block No previous ECGs available Confirmed by Julien Nordmann [phone]) on 10/13/2022 8:52:07 AM    PMH:   has a past medical history of Allergy, Hyperlipidemia, Hypertension, Sleep apnea, Vertigo (06/03/2015), and Wears hearing aid in both ears.  PSH:    Past Surgical History:  Procedure Laterality Date   CATARACT EXTRACTION Bilateral    COLONOSCOPY WITH PROPOFOL     COLONOSCOPY WITH PROPOFOL N/A 05/26/2022   Procedure: COLONOSCOPY WITH PROPOFOL;  Surgeon: Regis Bill, MD;  Location:  [facility] END[facility]COPY;  Service: Endoscopy;  Laterality: N/A;   ETHMOIDECTOMY Bilateral 02/27/2019   Procedure: ETHMOIDECTOMY;  Surgeon: Vernie Murders, MD;  Location: [facility];  Service: ENT;  Laterality: Bilateral;   FRONTAL SINUS EXPLORATION Bilateral 02/27/2019   Procedure: FRONTAL SINUS EXPLORATION;  Surgeon: Vernie Murders, MD;  Location: [facility];  Service: ENT;  Laterality: Bilateral;   IMAGE GUIDED SINUS SURGERY Bilateral 02/27/2019   Procedure: IMAGE GUIDED SINUS SURGERY;  Surgeon: Vernie Murders, MD;  Location: [facility];  Service: ENT;  Laterality: Bilateral;   LASIK Bilateral 2005   Dr Delaney Meigs   MAXILLARY ANTROSTOMY Bilateral 02/27/2019   Procedure: MAXILLARY ANTROSTOMY WITH TISSUE REMOVAL;  Surgeon: Vernie Murders, MD;  Location: [facility];  Service: ENT;  Laterality: Bilateral;   SEPTOPLASTY Bilateral 02/27/2019   Procedure: SEPTOPLASTY;  Surgeon: Vernie Murders, MD;  Location: [facility];  Service: ENT;  Laterality: Bilateral;  disk put on desk in charge nurse office, 02/06/2019 ds put 2nd disk on charge nurse desk 10-29 kp   TURBINATE REDUCTION Bilateral 02/27/2019   Procedure: TURBINATE REDUCTION;  Surgeon: Vernie Murders, MD;  Location: [facility];  Service: ENT;  Laterality: Bilateral;   TYMPANOSTOMY TUBE PLACEMENT     VASECTOMY  04/1995    Current Outpatient Medications  Medication Sig Dispense Refill   aspirin EC 81 MG tablet Take 1 tablet (81 mg total) by mouth daily. Swallow whole.     atorvastatin (LIPITOR) 20 MG tablet Take 1 tablet (20 mg total) by mouth daily. 90 tablet 3   azelastine (  ASTELIN) 0.1 % nasal spray Place 1 spray into both nostrils 2 (two) times daily. Use in each nostril as directed 30 mL 0   cetirizine (ZYRTEC) 10 MG tablet Take 10 mg by mouth daily.     ezetimibe (ZETIA) 10 MG tablet Take 1 tablet (10 mg total) by mouth daily. 90 tablet 3   fluticasone (FLONASE) 50 MCG/ACT nasal spray Place 2  sprays into both nostrils daily. 16 g 6   hydrochlorothiazide (HYDRODIURIL) 12.5 MG tablet TAKE 1 TABLET DAILY 90 tablet 1   loratadine (CLARITIN) 10 MG tablet Take 10 mg by mouth daily.     metoprolol succinate (TOPROL-XL) 25 MG 24 hr tablet TAKE 1 TABLET BY MOUTH DAILY. *TAKE WITHOR IMMEDIATELY FOLLOWING A MEAL* 90 tablet 1   montelukast (SINGULAIR) 10 MG tablet Take 1 tablet (10 mg total) by mouth at bedtime. 90 tablet 3   naproxen (NAPROSYN) 250 MG tablet Take 250 mg by mouth as needed.     tadalafil (CIALIS) 5 MG tablet Take 0.5-1 tablets (2.5-5 mg total) by mouth daily. 30 tablet 11   Semaglutide-Weight Management 0.25 MG/0.5ML SOAJ Inject 0.25 mg into the skin once a week. (Patient not taking: Reported on 10/13/2022) 2 mL 2   [START ON 11/10/2022] Semaglutide-Weight Management 0.5 MG/0.5ML SOAJ Inject 0.5 mg into the skin once a week. (Patient not taking: Reported on 10/13/2022) 2 mL 2   [START ON 12/09/2022] Semaglutide-Weight Management 1 MG/0.5ML SOAJ Inject 1 mg into the skin once a week. (Patient not taking: Reported on 10/13/2022) 2 mL 2   [START ON 01/07/2023] Semaglutide-Weight Management 1.7 MG/0.75ML SOAJ Inject 1.7 mg into the skin once a week. (Patient not taking: Reported on 10/13/2022) 3 mL 2   [START ON 02/05/2023] Semaglutide-Weight Management 2.4 MG/0.75ML SOAJ Inject 2.4 mg into the skin once a week. (Patient not taking: Reported on 10/13/2022) 3 mL 2   No current facility-administered medications for this visit.     Allergies:   Antihistamines, diphenhydramine-type; Erythromycin; Flomax [tamsulosin]; Guaifenesin & derivatives; and Metoprolol   Social History:  The patient  reports that he quit smoking about 49 years ago. His smoking use included cigarettes. He has a 2.00 pack-year smoking history. He has never used smokeless tobacco. He reports current alcohol use. He reports that he does not use drugs.   Family History:   family history includes COPD in his sister; Cancer in  his father; Coronary artery disease in his father; Diabetes in his paternal grandfather and paternal uncle; Heart disease in his father; Hyperlipidemia in his mother; Hypertension in his mother; Prostate cancer in his father; Stroke in his father.    Review of Systems: Review of Systems  Constitutional: Negative.   HENT: Negative.    Respiratory: Negative.    Cardiovascular: Negative.   Gastrointestinal: Negative.   Musculoskeletal: Negative.   Neurological: Negative.   Psychiatric/Behavioral: Negative.    All other systems reviewed and are negative.    PHYSICAL EXAM: VS:  BP 130/78 (BP Location: Left Arm, Patient Position: Sitting, Cuff Size: Large)   Pulse 68   Ht 5\' 9"  (1.753 m)   Wt 292 lb 2 oz (132.5 kg)   SpO2 95%   BMI 43.14 kg/m  , BMI Body mass index is 43.14 kg/m. GEN: Well nourished, well developed, in no acute distress HEENT: normal Neck: no JVD, carotid bruits, or masses Cardiac: RRR; no murmurs, rubs, or gallops,no edema  Respiratory:  clear to auscultation bilaterally, normal work of breathing GI: soft, nontender,  nondistended, + BS MS: no deformity or atrophy Skin: warm and dry, no rash Neuro:  Strength and sensation are intact Psych: euthymic mood, full affect   Recent Labs: 03/29/2022: ALT 17; BUN 23; Creatinine, Ser 0.84; Potassium 4.3; Sodium 140    Lipid Panel Lab Results  Component Value Date   CHOL 186 03/29/2022   HDL 55.20 03/29/2022   LDLCALC 118 (H) 03/29/2022   TRIG 66.0 03/29/2022      Wt Readings from Last 3 Encounters:  10/13/22 292 lb 2 oz (132.5 kg)  09/07/22 288 lb (130.6 kg)  06/29/22 285 lb (129.3 kg)       ASSESSMENT AND PLAN:  Problem List Items Addressed This Visit       Cardiology Problems   CAD (coronary artery disease) - Primary   Relevant Medications   ezetimibe (ZETIA) 10 MG tablet   Other Relevant Orders   EKG 12-Lead (Completed)   Essential hypertension   Relevant Medications   ezetimibe (ZETIA) 10  MG tablet   Other Relevant Orders   EKG 12-Lead (Completed)     Other   OSA (obstructive sleep apnea)   Other Visit Diagnoses     Mixed hyperlipidemia       Relevant Medications   ezetimibe (ZETIA) 10 MG tablet   Aortic atherosclerosis (HCC)       Relevant Medications   ezetimibe (ZETIA) 10 MG tablet   Other Relevant Orders   EKG 12-Lead (Completed)   Medication management       Relevant Orders   Lipid panel   Hepatic function panel      Cad with stable angina Heavy coronary calcification on CT scan, calcium score greater than 3000 Images pulled up and reviewed in detail Strong family history of coronary disease, brother with sudden death, father with coronary disease and MI Poor endurance, shortness of breath, unable to exclude ischemia We have ordered pharmacologic Myoview to rule out ischemia Cardiac CTA would be less beneficial in the setting of heavy calcification  Hyperlipidemia Recently started on Lipitor 20, we will add Zetia 10 mg to achieve goal LDL less than 55.  Lipids and LFTs ordered September 2024  Essential hypertension Blood pressure is well controlled on today's visit. No changes made to the medications.  Morbid obesity Reports he has Wegovy, will start this week  Aortic atherosclerosis Very mild, seen on calcium score CT scan images Management as above   Total encounter time more than 60 minutes  Greater than 50% was spent in counseling and coordination of care with the patient    Signed, Dossie Arbour, M.D., Ph.D. [facility] [place], [place] [phone]

## 2022-10-13 ENCOUNTER — Encounter: Payer: Self-pay | Admitting: Cardiovascular Disease

## 2022-10-13 ENCOUNTER — Ambulatory Visit: Payer: Managed Care, Other (non HMO) | Attending: Cardiovascular Disease | Admitting: Cardiovascular Disease

## 2022-10-13 VITALS — BP 130/78 | HR 68 | Ht 69.0 in | Wt 292.1 lb

## 2022-10-13 DIAGNOSIS — Z79899 Other long term (current) drug therapy: Secondary | ICD-10-CM

## 2022-10-13 DIAGNOSIS — R072 Precordial pain: Secondary | ICD-10-CM

## 2022-10-13 DIAGNOSIS — I10 Essential (primary) hypertension: Secondary | ICD-10-CM | POA: Diagnosis not present

## 2022-10-13 DIAGNOSIS — E782 Mixed hyperlipidemia: Secondary | ICD-10-CM

## 2022-10-13 DIAGNOSIS — G4733 Obstructive sleep apnea (adult) (pediatric): Secondary | ICD-10-CM

## 2022-10-13 DIAGNOSIS — I700 Atherosclerosis of aorta: Secondary | ICD-10-CM

## 2022-10-13 DIAGNOSIS — I25118 Atherosclerotic heart disease of native coronary artery with other forms of angina pectoris: Secondary | ICD-10-CM

## 2022-10-13 MED ORDER — EZETIMIBE 10 MG PO TABS: 10.0000 mg | ORAL_TABLET | Freq: Every day | ORAL | 3 refills | Status: DC

## 2022-10-13 NOTE — Patient Instructions (Addendum)
Medication Instructions:  Please start zetia 10 mg daily  If you need a refill on your cardiac medications before your next appointment, please call your pharmacy.   Lab work:  Your provider would like for you to return in September to have the following labs drawn: Lipids and Hepatic Function Test You will need to be fasting.   You may also go to any of these [facility] locations:   [facility] - [address] ([facility]) - [address] [phone] [address]   [place] - [address]    [place]  - [address]    [place] - [address] - [address] [employer]  - [address] [place] - [address] ([facility]   Testing/Procedures:  Your provider has ordered a Lexiscan/ Exercise Myoview Stress test. This will take place at [facility]. Please report to the [facility] medical mall entrance. The volunteers at the first desk will direct you where to go.  [facility] MYOVIEW  Your provider has ordered a Stress Test with nuclear imaging. The purpose of this test is to evaluate the blood supply to your heart muscle. This procedure is referred to as a "Non-Invasive Stress Test." This is because other than having an IV started in your vein, nothing is inserted or "invades" your body. Cardiac stress tests are done to find areas of poor blood flow to the heart by determining the extent of coronary artery disease (CAD). Some patients exercise on a treadmill, which naturally increases the blood flow to your heart, while others who are unable to walk on a treadmill due to physical limitations will have a pharmacologic/chemical stress agent called Lexiscan . This medicine will mimic walking on a treadmill by temporarily increasing your coronary blood flow.   Please note: these test may take anywhere between 2-4 hours to complete  How to prepare for your Myoview test:  Nothing to eat for 6  hours prior to the test No caffeine for 24 hours prior to test No smoking 24 hours prior to test. Your medication may be taken with water.  If your doctor stopped a medication because of this test, do not take that medication. Ladies, please do not wear dresses.  Skirts or pants are appropriate. Please wear a short sleeve shirt. No perfume, cologne or lotion. Wear comfortable walking shoes. No heels!   PLEASE NOTIFY THE OFFICE AT LEAST 24 HOURS IN ADVANCE IF YOU ARE UNABLE TO KEEP YOUR APPOINTMENT.  [phone] AND  PLEASE NOTIFY NUCLEAR MEDICINE AT [facility] AT LEAST 24 HOURS IN ADVANCE IF YOU ARE UNABLE TO KEEP YOUR APPOINTMENT. [phone]   Follow-Up: At [facility], you and your health needs are our priority.  As part of our continuing mission to provide you with exceptional heart care, we have created designated Provider Care Teams.  These Care Teams include your primary Cardiologist (physician) and Advanced Practice Providers (APPs -  Physician Assistants and Nurse Practitioners) who all work together to provide you with the care you need, when you need it.  You will need a follow up appointment in 6 months  Providers on your designated Care Team:   Nicolasa Ducking, NP Eula Listen, PA-C Cadence Fransico [place], [place]  COVID-19 Vaccine Information can be found at: [email or web address] For questions related to vaccine distribution or appointments, please email [email or web address] or call [phone].

## 2022-10-16 ENCOUNTER — Telehealth: Payer: Managed Care, Other (non HMO) | Admitting: Family Medicine

## 2022-10-16 DIAGNOSIS — M545 Low back pain, unspecified: Secondary | ICD-10-CM

## 2022-10-16 MED ORDER — NAPROXEN 500 MG PO TABS: 500.0000 mg | ORAL_TABLET | Freq: Two times a day (BID) | ORAL | 0 refills | Status: DC

## 2022-10-16 NOTE — Progress Notes (Signed)
We are sorry that you are not feeling well.  Here is how we plan to help!  Based on what you have shared with me it looks like you mostly have acute back pain.  Acute back pain is defined as musculoskeletal pain that can resolve in 1-3 weeks with conservative treatment.  I have prescribed Naprosyn 500 mg take one by mouth twice a day non-steroid anti-inflammatory (NSAID)  Some patients experience stomach irritation or in increased heartburn with anti-inflammatory drugs.    Back pain is very common.  The pain often gets better over time.  The cause of back pain is usually not dangerous.  Most people can learn to manage their back pain on their own.  Home Care Stay active.  Start with short walks on flat ground if you can.  Try to walk farther each day. Do not sit, drive or stand in one place for more than 30 minutes.  Do not stay in bed. Do not avoid exercise or work.  Activity can help your back heal faster. Be careful when you bend or lift an object.  Bend at your knees, keep the object close to you, and do not twist. Sleep on a firm mattress.  Lie on your side, and bend your knees.  If you lie on your back, put a pillow under your knees. Only take medicines as told by your doctor. Put ice on the injured area. Put ice in a plastic bag Place a towel between your skin and the bag Leave the ice on for 15-20 minutes, 3-4 times a day for the first 2-3 days. 210 After that, you can switch between ice and heat packs. Ask your doctor about back exercises or massage. Avoid feeling anxious or stressed.  Find good ways to deal with stress, such as exercise.  Get Help Right Way If: Your pain does not go away with rest or medicine. Your pain does not go away in 1 week. You have new problems. You do not feel well. The pain spreads into your legs. You cannot control when you poop (bowel movement) or pee (urinate) You feel sick to your stomach (nauseous) or throw up (vomit) You have belly (abdominal)  pain. You feel like you may pass out (faint). If you develop a fever.  Make Sure you: Understand these instructions. Will watch your condition Will get help right away if you are not doing well or get worse.  Your e-visit answers were reviewed by a board certified advanced clinical practitioner to complete your personal care plan.  Depending on the condition, your plan could have included both over the counter or prescription medications.  If there is a problem please reply  once you have received a response from your provider.  Your safety is important to [place].  If you have drug allergies check your prescription carefully.    You can use MyChart to ask questions about today's visit, request a non-urgent call back, or ask for a work or school excuse for 24 hours related to this e-Visit. If it has been greater than 24 hours you will need to follow up with your provider, or enter a new e-Visit to address those concerns.  You will get an e-mail in the next two days asking about your experience.  I hope that your e-visit has been valuable and will speed your recovery. Thank you for using e-visits.  I provided 5 minutes of non face-to-face time during this encounter for chart review, medication and order placement, as  well as and documentation.   

## 2022-10-25 ENCOUNTER — Encounter: Payer: Self-pay | Admitting: Family Medicine

## 2022-10-25 ENCOUNTER — Telehealth: Payer: Self-pay

## 2022-10-25 ENCOUNTER — Other Ambulatory Visit (HOSPITAL_COMMUNITY): Payer: Self-pay

## 2022-10-25 NOTE — Telephone Encounter (Signed)
Patient Advocate Encounter  Prior Authorization for Reginal Lutes has been approved with [facility] [employer].    Effective dates: 10/12/22 through 05/14/23  Approval letter attached to charts.

## 2022-11-02 ENCOUNTER — Encounter
Admission: RE | Admit: 2022-11-02 | Discharge: 2022-11-02 | Disposition: A | Discharge status: Home / Self Care | Payer: Managed Care, Other (non HMO) | Source: Ambulatory Visit | Attending: Cardiovascular Disease | Admitting: Cardiovascular Disease

## 2022-11-02 DIAGNOSIS — R072 Precordial pain: Secondary | ICD-10-CM | POA: Diagnosis present

## 2022-11-02 LAB — NM MYOCAR MULTI W/SPECT W/WALL MOTION / EF
LV dias vol: 147 mL (ref 62–150)
LV sys vol: 52 mL
Nuc Stress EF: 65 %
Peak HR: 85 {beats}/min
Rest HR: 61 {beats}/min
Rest Nuclear Isotope Dose: 9.5 mCi
SDS: 1
SRS: 3
SSS: 1
ST Depression (mm): 0 mm
Stress Nuclear Isotope Dose: 29.6 mCi
TID: 1.14

## 2022-11-02 MED ORDER — TECHNETIUM TC 99M TETROFOSMIN IV KIT
10.0000 | PACK | Freq: Once | INTRAVENOUS | Status: AC | PRN
  Administered 2022-11-02: 9.54 via INTRAVENOUS

## 2022-11-02 MED ORDER — REGADENOSON 0.4 MG/5ML IV SOLN
0.4000 mg | Freq: Once | INTRAVENOUS | Status: AC
  Administered 2022-11-02: 0.4 mg via INTRAVENOUS

## 2022-11-02 MED ORDER — TECHNETIUM TC 99M TETROFOSMIN IV KIT
30.0000 | PACK | Freq: Once | INTRAVENOUS | Status: AC | PRN
  Administered 2022-11-02: 29.57 via INTRAVENOUS

## 2022-11-03 ENCOUNTER — Other Ambulatory Visit: Payer: Managed Care, Other (non HMO)

## 2022-11-03 ENCOUNTER — Encounter: Payer: Self-pay | Admitting: Pharmacist

## 2022-11-09 ENCOUNTER — Other Ambulatory Visit: Payer: Managed Care, Other (non HMO) | Admitting: Pharmacist

## 2022-11-13 ENCOUNTER — Other Ambulatory Visit: Payer: Managed Care, Other (non HMO) | Admitting: Pharmacist

## 2022-11-16 ENCOUNTER — Other Ambulatory Visit: Payer: Self-pay | Admitting: Pharmacist

## 2022-11-16 ENCOUNTER — Other Ambulatory Visit: Payer: Self-pay

## 2022-11-16 ENCOUNTER — Other Ambulatory Visit (HOSPITAL_COMMUNITY): Payer: Self-pay

## 2022-11-16 MED ORDER — ATORVASTATIN CALCIUM 20 MG PO TABS: 20.0000 mg | ORAL_TABLET | Freq: Every day | ORAL | 3 refills | Status: DC

## 2022-11-16 NOTE — Progress Notes (Signed)
Refill request; needs to go to [facility]

## 2022-11-16 NOTE — Progress Notes (Signed)
Care Coordination Call  Patient notes local pharmacy does not have Wegovy 0.5 mg in stock. Collaborated with [facility] at [facility]. Patient will call to provide a method of payment and the pharmacy will transfer in prescriptions.   [name], PharmD, BCACP, CPP Clinical Pharmacist [facility] [phone]

## 2022-11-17 ENCOUNTER — Other Ambulatory Visit (HOSPITAL_COMMUNITY): Payer: Self-pay

## 2022-11-17 ENCOUNTER — Other Ambulatory Visit: Payer: Self-pay

## 2022-11-17 MED ORDER — SEMAGLUTIDE-WEIGHT MANAGEMENT 1 MG/0.5ML ~~LOC~~ SOAJ
1.0000 mg | SUBCUTANEOUS | 2 refills | Status: DC
  Filled 2022-11-17: qty 2, fill #0
  Filled 2022-12-13: qty 2, 28d supply, fill #0
  Filled 2023-01-03: qty 2, 28d supply, fill #1

## 2022-11-17 MED ORDER — SEMAGLUTIDE-WEIGHT MANAGEMENT 1.7 MG/0.75ML ~~LOC~~ SOAJ: 1.7000 mg | SUBCUTANEOUS | 2 refills | Status: DC

## 2022-11-17 MED ORDER — SEMAGLUTIDE-WEIGHT MANAGEMENT 2.4 MG/0.75ML ~~LOC~~ SOAJ: 2.4000 mg | SUBCUTANEOUS | 2 refills | Status: DC

## 2022-11-17 MED ORDER — SEMAGLUTIDE-WEIGHT MANAGEMENT 0.5 MG/0.5ML ~~LOC~~ SOAJ
0.5000 mg | SUBCUTANEOUS | 2 refills | Status: DC
  Filled 2022-11-17: qty 2, 28d supply, fill #0

## 2022-11-17 MED ORDER — SEMAGLUTIDE-WEIGHT MANAGEMENT 2.4 MG/0.75ML ~~LOC~~ SOAJ
2.4000 mg | SUBCUTANEOUS | 2 refills | Status: DC
  Filled 2022-11-17: qty 3, fill #0
  Filled 2023-03-24: qty 3, 28d supply, fill #0

## 2022-11-17 MED ORDER — SEMAGLUTIDE-WEIGHT MANAGEMENT 1.7 MG/0.75ML ~~LOC~~ SOAJ
1.7000 mg | SUBCUTANEOUS | 2 refills | Status: DC
  Filled 2022-11-17: qty 3, fill #0
  Filled 2023-01-31: qty 3, 28d supply, fill #0
  Filled 2023-02-27: qty 3, 28d supply, fill #1

## 2022-11-17 MED ORDER — SEMAGLUTIDE-WEIGHT MANAGEMENT 1 MG/0.5ML ~~LOC~~ SOAJ: 1.0000 mg | SUBCUTANEOUS | 2 refills | Status: DC

## 2022-11-17 MED ORDER — SEMAGLUTIDE-WEIGHT MANAGEMENT 0.5 MG/0.5ML ~~LOC~~ SOAJ: 0.5000 mg | SUBCUTANEOUS | 2 refills | Status: DC

## 2022-11-17 NOTE — Progress Notes (Signed)
Agree. Thanks

## 2022-11-17 NOTE — Addendum Note (Signed)
Addended by: Nilda Simmer T on: 11/17/2022 11:56 AM   Modules accepted: Orders

## 2022-11-17 NOTE — Addendum Note (Signed)
Addended by: Joaquim Nam on: 11/17/2022 11:13 AM   Modules accepted: Orders

## 2022-11-17 NOTE — Addendum Note (Signed)
Addended by: Nilda Simmer T on: 11/17/2022 11:16 AM   Modules accepted: Orders

## 2022-11-30 ENCOUNTER — Other Ambulatory Visit: Payer: Managed Care, Other (non HMO) | Admitting: Pharmacist

## 2022-11-30 NOTE — Patient Instructions (Signed)
It was great talking with you today, Mr. Glassco!  Please increase Wegovy to 0.5 mg weekly when you finish your supply of 0.25 mg pens. After 4 weeks on 0.5 mg, let the pharmacy know whether you would like to refill 0.5 mg or increase to 1 mg weekly based on your tolerability of the medicine.  Please reach out if you have any questions, and we will talk to you at the end of September!

## 2022-11-30 NOTE — Progress Notes (Signed)
 I have reviewed the pharmacist's encounter and agree with their documentation.   [name], PharmD, BCACP, CPP [facility] [phone]

## 2022-11-30 NOTE — Progress Notes (Signed)
11/30/2022 Name: Dustin [name] MRN: [id number] DOB: 03/17/1956  Chief Complaint  Patient presents with   Weight Loss    Dustin [name] is a 66 y.o. year old male who presented for a telephone visit.   They were referred to the pharmacist by their PCP for assistance in managing  weight management, ASCVD risk reduction .    Subjective: Tolerating new medication well- has already noticed that he is more full, decreasing portion sizes, staying away from foods higher in sugars and fats.   Taking ibuprofen and acetaminophen daily for back pain- willing to maximize tylenol.  Care Team: Primary Care Provider: Joaquim Nam, MD ; Next Scheduled Visit: 04/06/23  Medication Access/Adherence: No adherence concerns  Current Pharmacy:  [facility] - [place], [place] - [name] [place] [address] Phone: [phone] Fax: [phone]  [facility] - [place], [place] - [facility] AT Portal to Registered [address] [place] [address] Phone: [phone] Fax: [phone]  [facility] - [facility] [address] [place] [address] Phone: [phone] Fax: [phone]   Patient reports affordability concerns with their medications: No  Patient reports access/transportation concerns to their pharmacy: No  Patient reports adherence concerns with their medications:  No    Obesity/Overweight, Complicated by: ASCVD diagnosed by elevated CAC of 3300.   Current medications: semaglutide [facility]) 0.25 mg weekly - planning to increase to 0.5 mg in two weeks.   Weight Management treatments previously prescribed: none  Current meal patterns: 2-3 meals a day - eating about a half portion - Breakfast: egg, toast - Lunch: typically a sandwich - Supper lean meat, vegetables. Had half a hamburger the other evening out to eat.  - Snacks: crackers - Drinks: drinking water  throughout the day  Current physical activity: previously was going to the gym regularly (Fall 2023), planning to restart as his wife returns to work (as a Runner, broadcasting/film/video).   Current medication access support: none. Wegovy PA approved through 05/14/23.  Objective:  No results found for: "HGBA1C"  Lab Results  Component Value Date   CREATININE 0.84 03/29/2022   BUN 23 03/29/2022   NA 140 03/29/2022   K 4.3 03/29/2022   CL 99 03/29/2022   CO2 34 (H) 03/29/2022    Lab Results  Component Value Date   CHOL 186 03/29/2022   HDL 55.20 03/29/2022   LDLCALC 118 (H) 03/29/2022   TRIG 66.0 03/29/2022   CHOLHDL 3 03/29/2022    Medications Reviewed Today     Reviewed by Particia Lather, RPH (Pharmacist) on 11/30/22 at 0859  Med List Status: <None>   Medication Order Taking? Sig Documenting Provider Last Dose Status Informant  aspirin EC 81 MG tablet [id number] Yes Take 1 tablet (81 mg total) by mouth daily. Swallow whole. Joaquim Nam, MD Taking Active   atorvastatin (LIPITOR) 20 MG tablet [id number] Yes Take 1 tablet (20 mg total) by mouth daily. Joaquim Nam, MD Taking Active   azelastine (ASTELIN) 0.1 % nasal spray [id number] No Place 1 spray into both nostrils 2 (two) times daily. Use in each nostril as directed  Patient not taking: Reported on 11/30/2022   Margaretann Loveless, PA-C Not Taking Active   cetirizine (ZYRTEC) 10 MG tablet [id number] Yes Take 10 mg by mouth daily. [provider] Taking Active   ezetimibe (ZETIA) 10 MG tablet [id number] Yes Take 1 tablet (10 mg  total) by mouth daily. Antonieta Iba, MD Taking Active   fluticasone [facility]) 50 MCG/ACT nasal spray [id number] Yes Place 2 sprays into both nostrils daily. Daphine Deutscher, Mary-Margaret, FNP Taking Active   hydrochlorothiazide (HYDRODIURIL) 12.5 MG tablet [id number] Yes TAKE 1 TABLET DAILY Joaquim Nam, MD Taking Active   metoprolol succinate (TOPROL-XL) 25 MG 24 hr tablet [id number] Yes TAKE 1 TABLET BY MOUTH  DAILY. *TAKE WITHOR IMMEDIATELY FOLLOWING A MEAL* Joaquim Nam, MD Taking Active   montelukast (SINGULAIR) 10 MG tablet [id number] Yes Take 1 tablet (10 mg total) by mouth at bedtime. Joaquim Nam, MD Taking Active   naproxen (NAPROSYN) 500 MG tablet [id number] Yes Take 1 tablet (500 mg total) by mouth 2 (two) times daily with a meal. Freddy Finner, NP Taking Active            Med Note Particia Lather   Thu Nov 30, 2022  8:51 AM) Taking prn  Semaglutide-Weight Management 0.5 MG/0.5ML [place] [id number] Yes Inject 0.5 mg into the skin once a week. Joaquim Nam, MD Taking Active   Semaglutide-Weight Management 1 MG/0.5ML Ivory Broad [id number]  Inject 1 mg into the skin once a week. Joaquim Nam, MD  Active   Semaglutide-Weight Management 1.7 MG/0.75ML Ivory Broad [id number]  Inject 1.7 mg into the skin once a week. Joaquim Nam, MD  Active   Semaglutide-Weight Management 2.4 MG/0.75ML Ivory Broad [id number]  Inject 2.4 mg into the skin once a week. Joaquim Nam, MD  Active   tadalafil (CIALIS) 5 MG tablet [id number] Yes Take 0.5-1 tablets (2.5-5 mg total) by mouth daily. Joaquim Nam, MD Taking Active              Assessment/Plan:   Obesity/Overweight: - Currently unable to achieve goal weight loss of 5-10% through diet and lifestyle modifications alone. No history MEN2, medullary thyroid cancer, pancreatitis, s/p cholecystectomy.  - Extensive dietary counseling including education on focus on lean proteins, fruits and vegetables, whole grains and increased fiber consumption, adequate hydration - Extensive exercise counseling including eventual goal of 150 minutes of moderate intensity exercise weekly - Provided motivational interviewing. Discussed setting non-weight based goals - Recommend to increase to semaglutide [facility]) 0.5 mg weekly (after supply of 0.25 mg runs out).  - Advised patient that prescription for next dose increase to semaglutide 1 mg is on profile at pharmacy - if he is  tolerating well he can increase to the next higher dose after 4 weeks, or stay on 0.5 mg. Will re-assess at f/u  Hyperlipidemia/ASCVD Risk Reduction: - Currently uncontrolled with LDL of 118 mg/dL above goal <22 mg/dL per cardiology. Pt has been on atorvastatin for ~3 mo, and ezetimibe was recently started. Tolerating both well. If LDL cholesterol is not at goal at cardiology f/u, can consider increase to high intensity statin.  - Reviewed long term complications of uncontrolled cholesterol - Recommend to continue current regimen: atorvastatin 20 mg daily and ezetimibe 10 mg daily   Follow Up Plan: Pharmacist telephone 01/11/23, PCP and Cardiology Dec 2024.   Nils Pyle, PharmD PGY1 Pharmacy Resident

## 2022-12-13 ENCOUNTER — Other Ambulatory Visit: Payer: Self-pay

## 2022-12-13 ENCOUNTER — Other Ambulatory Visit (HOSPITAL_COMMUNITY): Payer: Self-pay

## 2022-12-13 ENCOUNTER — Encounter: Payer: Self-pay | Admitting: Cardiovascular Disease

## 2022-12-13 MED ORDER — EZETIMIBE 10 MG PO TABS: 10.0000 mg | ORAL_TABLET | Freq: Every day | ORAL | 3 refills | Status: DC

## 2022-12-14 ENCOUNTER — Other Ambulatory Visit: Payer: Self-pay

## 2023-01-04 ENCOUNTER — Other Ambulatory Visit: Payer: Self-pay

## 2023-01-04 ENCOUNTER — Other Ambulatory Visit (HOSPITAL_COMMUNITY): Payer: Self-pay

## 2023-01-08 ENCOUNTER — Other Ambulatory Visit: Payer: Self-pay

## 2023-01-08 DIAGNOSIS — Z79899 Other long term (current) drug therapy: Secondary | ICD-10-CM

## 2023-01-09 LAB — LIPID PANEL
Chol/HDL Ratio: 2.5 ratio (ref 0.0–5.0)
Cholesterol, Total: 108 mg/dL (ref 100–199)
HDL: 43 mg/dL (ref >39)
LDL Chol Calc (NIH): 48 mg/dL (ref 0–99)
Triglycerides: 85 mg/dL (ref 0–149)
VLDL Cholesterol Cal: 17 mg/dL (ref 5–40)

## 2023-01-09 LAB — HEPATIC FUNCTION PANEL
ALT: 20 IU/L (ref 0–44)
AST: 14 IU/L (ref 0–40)
Albumin: 4.5 g/dL (ref 3.9–4.9)
Alkaline Phosphatase: 95 IU/L (ref 44–121)
Bilirubin Total: 0.4 mg/dL (ref 0.0–1.2)
Bilirubin, Direct: 0.15 mg/dL (ref 0.00–0.40)
Total Protein: 6.7 g/dL (ref 6.0–8.5)

## 2023-01-11 ENCOUNTER — Other Ambulatory Visit: Payer: Managed Care, Other (non HMO) | Admitting: Pharmacist

## 2023-01-11 NOTE — Patient Instructions (Signed)
Mr. Dustin [name],   It was a pleasure to see you today! As we discussed:?   Continue Wegovy 1.0 mg under the skin once weekly If no concern of side effects after 4 injections, you may increase to [facility] 1.7 mg weekly dose If you do have concerns for side effects/tolerability, you may refill the 1.0 mg dose for another month.  If bowel movements become less frequent or more difficult to pass, you may try Miralax (the generic version is fine).  You may play around with the dosing to achieve more regular/more comfortable bowel movements.  Usual dosing for constipation is 1 capful (17 grams) once daily.  Some people find using 1/2 a capful, or use a few days a week to be sufficient.  Note that Miralax generally works best when used regularly (may not notice benefit with only 1 or 2 doses).   Continue atorvastatin and ezetimibe.    Follow up with clinical pharmacist via telephone in about 4 weeks:  02/08/23 8:30 AM.   Thanks!   Berenice Primas, PharmD - Clinical Pharmacist

## 2023-01-11 NOTE — Progress Notes (Signed)
01/11/2023 Name: DEVRON HOUSDEN MRN: [id number] DOB: [birth year]  Subjective  Chief Complaint  Patient presents with   Coronary Artery Disease   Obesity    Reason for visit: DIJON MCMEANS is a 67 y.o. year old male who presented for a telephone visit.   They were referred to the pharmacist by their PCP for assistance in managing  weight management, ASCVD Risk Reduction .  Care Team: Primary Care Provider: Joaquim Nam, MD  Reason for visit: ?  DAMIEN BUTSCH is a 67 y.o. male who presents today for a pharmacotherapy visit.? Pertinent PMH includes HTN, OSA, CAD.   Date of Last Visit with Pharmacist: 11/30/22 Action at Last Visit: Increase Wegovy to 0.5 mg weekly. If tolerating, may increase to 1 mg after 4 weeks, or continue 0.5 mg weekly   ASCVD HPI: Patient with history of CAD with stable angina and heavy coronary calcification on CT scan, calcium score >3000  Family History: Father MI age 51s; Uncle aorta aneurysm; Brother MI at age 3   Since Last visit / History of Present Illness: ?  Patient reports implementing plan from last visit. Denies adverse effects with titration of Wegovy and reports ongoing increase in satiety. He notes decreasing portion sizes, staying away from foods higher in sugars and fats.    Patient reports that Reginal Lutes is a "miracle drug". His main goal if CV health but also feels that he is losing weight and eating better.   Bowel movements almost every day, though feels less regular than previous.   Previously using daily ibuprofen and acetaminophen. Reports since last visit he has stopped using NSAIDs completely and feels that his Tylenol has controlled his pain well.   Medication Adherence/Access: Prescription drug coverage: Payor: CIGNA / Plan: CIGNA MANAGED / Product Type: *No Product type* / . Reports that all medications are  affordable.  [id number] PA approved through 05/14/23  Current Patient Assistance: None   Reported Weight Reduction  Regimen: ?  Wegovy 1.0 mg weekly (increased 01/10/23)   Reported Diet:  Breakfast: egg, toast Lunch: Sandwich Dinner: lean met, vegetable. Sometimes out to eat Snacks: Crackers Beverages: Water throughout the day.   Cardiovascular Risk Reduction History of clinical ASCVD? yes History of heart failure? no  History of hyperlipidemia? yes Current BMI: 40.25 kg/m2 (Ht 69 in, Wt 123.64 kg) 272 lbs on home scale Taking statin? yes; moderate intensity (atorvastatin 20 mg daily) Taking aspirin? indicated (secondary prevention); Taking   Taking SGLT-2i? no; Not indicated Taking GLP- 1 RA? yes Smoker? Former (Quit 1975, 2PY)          [employer] Management Progress Goal weight: N/A (CV risk reduction) Initial weight: 292 lb (10/13/22); Wegovy start Weight at last visit: Not documented Current weight:  272 lb (home scale)  Percentage weight loss: Since initial visit: -9.15 kg (20.13 lbs) = - 6.9% TBW Since last visit:  Weight not documented     _______________________________________________  Objective    Review of Systems:?  Constitutional:? No fever, chills or unintentional weight loss  Cardiovascular:? No chest pain or pressure, shortness of breath, dyspnea on exertion, orthopnea or LE edema  Pulmonary:? No cough or shortness of breath  GI:? No nausea, vomiting, constipation, diarrhea, abdominal pain, dyspepsia, change in bowel habits  Endocrine:? No polyuria, polyphagia or blurred vision  Psych:? No depression, anxiety, insomnia    Physical Examination:  Vitals:  Wt Readings from Last 3 Encounters:  01/11/23 272 lb (123.4 kg)  10/13/22 292 lb 2  oz (132.5 kg)  09/07/22 288 lb (130.6 kg)   BP Readings from Last 3 Encounters:  10/13/22 130/78  09/07/22 122/80  06/29/22 128/78   Pulse Readings from Last 3 Encounters:  10/13/22 68  09/07/22 74  06/29/22 69     Labs:?  Lab Results  Component Value Date   GLUCOSE 99 03/29/2022   MICRALBCREAT 5.3 09/09/2008   MICRALBCREAT  3.4 09/05/2007   MICRALBCREAT 4.4 07/10/2006   CREATININE 0.84 03/29/2022   CREATININE 0.86 03/29/2021   CREATININE 0.97 03/25/2020   GFRNONAA >60 05/25/2015   GFRNONAA 88.32 11/08/2009   GFRNONAA 93.27 09/09/2008    Lab Results  Component Value Date   CHOL 108 01/08/2023   HDL 43 01/08/2023   HDL 55.20 03/29/2022   HDL 45.60 03/29/2021   AST 14 01/08/2023   AST 12 03/29/2022   ALT 20 01/08/2023   ALT 17 03/29/2022      Chemistry      Component Value Date/Time   NA 140 03/29/2022 0816   K 4.3 03/29/2022 0816   CL 99 03/29/2022 0816   CO2 34 (H) 03/29/2022 0816   BUN 23 03/29/2022 0816   CREATININE 0.84 03/29/2022 0816      Component Value Date/Time   CALCIUM 9.8 03/29/2022 0816   ALKPHOS 95 01/08/2023 0926   AST 14 01/08/2023 0926   ALT 20 01/08/2023 0926   BILITOT 0.4 01/08/2023 0926       The ASCVD Risk score (Arnett DK, et al., 2019) failed to calculate for the following reasons:   The valid total cholesterol range is 130 to 320 mg/dL  Assessment and Plan:   1. Weight Management: : Tolerating Wegovy 1.0 mg well so far, first dose was yesterday. Notes continued reduction in appetite and weight. Weight on home scale is 272 lb which is significant progress from starting weight 292 lb. Expect continued weight loss with recent Wegovy titration. Notes slight decrease in bowel movement frequency though still regularly going every 1-2 days.  Current Regimen: Wegovy 1.0 mg weekly (increased 01/10/23) Since starting [facility] in June: -9.15 kg (-20.13 lbs) = - 6.9% TBW Diet: Continue to prioritize lean meats/veggies. We discussed incorporating snacks that are higher in protein to promote sustained satiety/energy levels mid-day.  Exercise: Sedentary. Wants to start going to the gym (feels working full time is a barrier). We set a goal at adding small walks daily (eg 10-20 minutes at lunch, neighborhood walk after dinner).   Conitnue Wegovy 1 mg weekly x3 more weeks. After  completion of 1 mg pens, may refill if concern for side effects, or increase to 1.7 mg weekly dose.  Consider Miralax PRN to scheduled daily if constipation worsens.  F/u 4 weeks   3. ASCVD (secondary prevention): Current Regimen: atorvastatin 20 mg daily, ezetimibe 10 mg daily Key risk factors include:  Hypertension: Well controlled based on last clinic BP of 130/78, 122/80 mmHg, at goal <130/80 mmHg. Does not monitor BP at home regularly, though when he does check, BP are usually around 130/80 mmHg. Stopped using NSAIDs.  Hyperlipidemia: Well controlled per last lipid panel on 01/08/23 with LDL of 48 mg/dL which is at goal of <21 mg/dL for secondary prevention. Family history CAD  BMI >30 kg/m2: Significant and ongoing improvement with current medication regimen.  Known CAD (CAC >3000, stable angina) Continue medications today without changes.   Follow Up Follow up with clinical pharmacist via phone in 4 weeks.  ?   Future Appointments  Date Time  Provider Department Center  02/08/2023  8:30 AM [facility] PHARMACIST [facility]  03/02/2023 11:00 AM Parrett, Virgel Bouquet, NP [facility] None  03/30/2023  8:15 AM [facility] LAB [facility]  04/02/2023  9:45 AM [facility] ANNUAL WELLNESS VISIT 1 [facility]  04/06/2023  8:30 AM Joaquim Nam, MD [facility]  04/09/2023 11:40 AM Mariah Milling, Tollie Pizza, MD [facility] None     Loree Fee, PharmD Clinical Pharmacist [facility] [phone]

## 2023-01-31 ENCOUNTER — Other Ambulatory Visit: Payer: Self-pay

## 2023-02-08 ENCOUNTER — Other Ambulatory Visit: Payer: Managed Care, Other (non HMO)

## 2023-02-09 ENCOUNTER — Other Ambulatory Visit: Payer: Self-pay | Admitting: Family Medicine

## 2023-02-10 ENCOUNTER — Telehealth: Payer: Managed Care, Other (non HMO) | Admitting: Family Medicine

## 2023-02-10 DIAGNOSIS — J069 Acute upper respiratory infection, unspecified: Secondary | ICD-10-CM

## 2023-02-10 DIAGNOSIS — H9203 Otalgia, bilateral: Secondary | ICD-10-CM | POA: Diagnosis not present

## 2023-02-10 MED ORDER — AMOXICILLIN-POT CLAVULANATE 875-125 MG PO TABS: 1.0000 | ORAL_TABLET | Freq: Two times a day (BID) | ORAL | 0 refills | Status: DC

## 2023-02-10 NOTE — Progress Notes (Signed)
E-Visit for Sinus Problems  We are sorry that you are not feeling well.  Here is how we plan to help!  Based on what you have shared with me it looks like you have sinusitis.  Sinusitis is inflammation and infection in the sinus cavities of the head.  Based on your presentation I believe you most likely have Acute Bacterial Sinusitis.  This is an infection caused by bacteria and is treated with antibiotics. I have prescribed Augmentin 875mg/125mg one tablet twice daily with food, for 7 days. You may use an oral decongestant such as Mucinex D or if you have glaucoma or high blood pressure use plain Mucinex. Saline nasal spray help and can safely be used as often as needed for congestion.  If you develop worsening sinus pain, fever or notice severe headache and vision changes, or if symptoms are not better after completion of antibiotic, please schedule an appointment with a health care provider.    Sinus infections are not as easily transmitted as other respiratory infection, however we still recommend that you avoid close contact with loved ones, especially the very young and elderly.  Remember to wash your hands thoroughly throughout the day as this is the number one way to prevent the spread of infection!  Home Care: Only take medications as instructed by your medical team. Complete the entire course of an antibiotic. Do not take these medications with alcohol. A steam or ultrasonic humidifier can help congestion.  You can place a towel over your head and breathe in the steam from hot water coming from a faucet. Avoid close contacts especially the very young and the elderly. Cover your mouth when you cough or sneeze. Always remember to wash your hands.  Get Help Right Away If: You develop worsening fever or sinus pain. You develop a severe head ache or visual changes. Your symptoms persist after you have completed your treatment plan.  Make sure you Understand these instructions. Will watch  your condition. Will get help right away if you are not doing well or get worse.  Thank you for choosing an e-visit.  Your e-visit answers were reviewed by a board certified advanced clinical practitioner to complete your personal care plan. Depending upon the condition, your plan could have included both over the counter or prescription medications.  Please review your pharmacy choice. Make sure the pharmacy is open so you can pick up prescription now. If there is a problem, you may contact your provider through MyChart messaging and have the prescription routed to another pharmacy.  Your safety is important to us. If you have drug allergies check your prescription carefully.   For the next 24 hours you can use MyChart to ask questions about today's visit, request a non-urgent call back, or ask for a work or school excuse. You will get an email in the next two days asking about your experience. I hope that your e-visit has been valuable and will speed your recovery.    have provided 5 minutes of non face to face time during this encounter for chart review and documentation.   

## 2023-02-27 ENCOUNTER — Other Ambulatory Visit (HOSPITAL_COMMUNITY): Payer: Self-pay

## 2023-03-02 ENCOUNTER — Ambulatory Visit: Payer: Managed Care, Other (non HMO) | Admitting: Adult Health

## 2023-03-17 ENCOUNTER — Telehealth: Payer: Managed Care, Other (non HMO) | Admitting: Physician Assistant

## 2023-03-17 DIAGNOSIS — J069 Acute upper respiratory infection, unspecified: Secondary | ICD-10-CM | POA: Diagnosis not present

## 2023-03-17 MED ORDER — FLUTICASONE PROPIONATE 50 MCG/ACT NA SUSP: 2.0000 | Freq: Every day | NASAL | 6 refills | Status: DC

## 2023-03-17 NOTE — Progress Notes (Signed)
E-Visit for Sinus Problems  We are sorry that you are not feeling well.  Here is how we plan to help!  Based on what you have shared with me it looks like you have sinusitis.  Sinusitis is inflammation and infection in the sinus cavities of the head.  Based on your presentation I believe you most likely have Acute Viral Sinusitis.This is an infection most likely caused by a virus. There is not specific treatment for viral sinusitis other than to help you with the symptoms until the infection runs its course.  You may use an oral decongestant such as Mucinex D or if you have glaucoma or high blood pressure use plain Mucinex. Saline nasal spray help and can safely be used as often as needed for congestion, I have prescribed: Fluticasone nasal spray two sprays in each nostril once a day  Some authorities believe that zinc sprays or the use of Echinacea may shorten the course of your symptoms.  Sinus infections are not as easily transmitted as other respiratory infection, however we still recommend that you avoid close contact with loved ones, especially the very young and elderly.  Remember to wash your hands thoroughly throughout the day as this is the number one way to prevent the spread of infection!  Home Care: Only take medications as instructed by your medical team. Do not take these medications with alcohol. A steam or ultrasonic humidifier can help congestion.  You can place a towel over your head and breathe in the steam from hot water coming from a faucet. Avoid close contacts especially the very young and the elderly. Cover your mouth when you cough or sneeze. Always remember to wash your hands.  Get Help Right Away If: You develop worsening fever or sinus pain. You develop a severe head ache or visual changes. Your symptoms persist after you have completed your treatment plan.  Make sure you Understand these instructions. Will watch your condition. Will get help right away if you  are not doing well or get worse.   Thank you for choosing an e-visit.  Your e-visit answers were reviewed by a board certified advanced clinical practitioner to complete your personal care plan. Depending upon the condition, your plan could have included both over the counter or prescription medications.  Please review your pharmacy choice. Make sure the pharmacy is open so you can pick up prescription now. If there is a problem, you may contact your provider through MyChart messaging and have the prescription routed to another pharmacy.  Your safety is important to us. If you have drug allergies check your prescription carefully.   For the next 24 hours you can use MyChart to ask questions about today's visit, request a non-urgent call back, or ask for a work or school excuse. You will get an email in the next two days asking about your experience. I hope that your e-visit has been valuable and will speed your recovery.  I have spent 5 minutes in review of e-visit questionnaire, review and updating patient chart, medical decision making and response to patient.   Trimaine Maser Z Ward, PA-C      

## 2023-03-18 ENCOUNTER — Other Ambulatory Visit: Payer: Self-pay | Admitting: Family Medicine

## 2023-03-18 DIAGNOSIS — Z125 Encounter for screening for malignant neoplasm of prostate: Secondary | ICD-10-CM

## 2023-03-18 DIAGNOSIS — I10 Essential (primary) hypertension: Secondary | ICD-10-CM

## 2023-03-24 ENCOUNTER — Other Ambulatory Visit (HOSPITAL_BASED_OUTPATIENT_CLINIC_OR_DEPARTMENT_OTHER): Payer: Self-pay

## 2023-03-26 ENCOUNTER — Other Ambulatory Visit: Payer: Self-pay

## 2023-03-27 NOTE — Progress Notes (Unsigned)
03/28/2023 Name: Dustin [name] MRN: [id number] DOB: 06/30/1955  Subjective  Chief Complaint  Patient presents with   Obesity    They were referred to the pharmacist by their PCP for assistance in managing  weight management, ASCVD Risk Reduction .  Care Team: Primary Care Provider: Joaquim Nam, MD  Reason for visit: ?  Dustin [name] is a 67 y.o. male who presents today for a pharmacotherapy visit.? Pertinent PMH includes HTN, OSA, CAD.   Date of Last Visit with Pharmacist: 01/11/23 Action at Last Visit:  9/26: Continue Wegovy 1 mg weekly x3 weeks THEN may refill if concern for side effects, or increase to 1.7 mg weekly dose.  8/15: Increase Wegovy to 0.5 mg weekly. If tolerating, may increase to 1 mg after 4 weeks, or continue 0.5 mg weekly  ASCVD HPI: Patient with history of CAD with stable angina and heavy coronary calcification on CT scan, calcium score >3000. Family History: Father MI age 63s; Uncle aorta aneurysm; Brother MI at age 24   Since Last visit / History of Present Illness: ?  Patient reports implementing plan from last visit. Denies adverse effects with titration of Wegovy to 1.7 mg and reports ongoing increase in satiety. He notes decreasing portion sizes, staying away from foods higher in sugars and fats.  Feels that constipation/GI symptoms have gone away completely. Has Miralax at home for PRN use.  Last week, filled Wegovy 2.4 mg dose, has 1-2 doses remaining of 1.7 mg then plans to increase.   Medication Adherence/Access: Prescription drug coverage: Payor: CIGNA / Plan: CIGNA MANAGED / Product Type: *No Product type* / . Reports that all medications are  affordable.  Wegovy PA approved through 05/14/23   Reported Weight Reduction Regimen: ?  Wegovy 1.7 mg weekly (plan to increase to 2.4 mg weekly in 1-2 weeks)   Reported Diet:  Breakfast: egg, toast Lunch: Sandwich Dinner: lean met, vegetable. Sometimes out to eat Snacks: Crackers Beverages:  Water throughout the day.   Exercise: Minimal currently.  Patient reports goal of increasing exercise/activity in the new year. Feels he is losing weight though wants to ensure the weight is mostly fat and not muscle. Plans to start exercising in 2025.   Cardiovascular Risk Reduction History of clinical ASCVD? yes History of heart failure? no  History of hyperlipidemia? yes Current BMI: 39.3 kg/m2 (Ht 69 in, Wt 123.64 kg) 266 lbs on home scale (~03/28/23) Taking statin? yes; moderate intensity (atorvastatin 20 mg daily) Taking aspirin? indicated (secondary prevention); Taking  Taking SGLT-2i? no; Not indicated Taking GLP- 1 RA? yes Smoker? Former (Quit 1975, 2PY)        [employer] Management Progress Goal weight: N/A (CV risk reduction) Initial weight: 132.7 kg    292 lb (10/13/22); Wegovy start Initial BMI: 43.1 kg/m2 Weight at last visit: 272 lb Current weight: 266 lb (home scale)  Current BMI: 39.3 kg/m2 Percentage weight loss: Since initial visit: -11.8 kg (-26.13 lbs) = - 8.9% TBW Since last visit:  -6 lb    _______________________________________________  Objective    Review of Systems:?  Constitutional:? No fever, chills or unintentional weight loss  Cardiovascular:? No chest pain or pressure, shortness of breath, dyspnea on exertion, orthopnea or LE edema  Pulmonary:? No cough or shortness of breath  GI:? No nausea, vomiting, constipation, diarrhea, abdominal pain, dyspepsia, change in bowel habits  Endocrine:? No polyuria, polyphagia or blurred vision  Psych:? No depression, anxiety, insomnia    Physical Examination:  Vitals:  Wt Readings from Last 3 Encounters:  01/11/23 272 lb (123.4 kg)  10/13/22 292 lb 2 oz (132.5 kg)  09/07/22 288 lb (130.6 kg)   BP Readings from Last 3 Encounters:  10/13/22 130/78  09/07/22 122/80  06/29/22 128/78   Pulse Readings from Last 3 Encounters:  10/13/22 68  09/07/22 74  06/29/22 69     Labs:?  Lab Results  Component Value  Date   GLUCOSE 99 03/29/2022   MICRALBCREAT 5.3 09/09/2008   MICRALBCREAT 3.4 09/05/2007   MICRALBCREAT 4.4 07/10/2006   CREATININE 0.84 03/29/2022   CREATININE 0.86 03/29/2021   CREATININE 0.97 03/25/2020   GFRNONAA >60 05/25/2015   GFRNONAA 88.32 11/08/2009   GFRNONAA 93.27 09/09/2008    Lab Results  Component Value Date   CHOL 108 01/08/2023   HDL 43 01/08/2023   HDL 55.20 03/29/2022   HDL 45.60 03/29/2021   AST 14 01/08/2023   AST 12 03/29/2022   ALT 20 01/08/2023   ALT 17 03/29/2022      Chemistry      Component Value Date/Time   NA 140 03/29/2022 0816   K 4.3 03/29/2022 0816   CL 99 03/29/2022 0816   CO2 34 (H) 03/29/2022 0816   BUN 23 03/29/2022 0816   CREATININE 0.84 03/29/2022 0816      Component Value Date/Time   CALCIUM 9.8 03/29/2022 0816   ALKPHOS 95 01/08/2023 0926   AST 14 01/08/2023 0926   ALT 20 01/08/2023 0926   BILITOT 0.4 01/08/2023 0926       The ASCVD Risk score (Arnett DK, et al., 2019) failed to calculate for the following reasons:   The valid total cholesterol range is 130 to 320 mg/dL  Assessment and Plan:   1. Weight Management: Improvement Tolerating Wegovy 1.7 mg well without adverse effects. BM well-controlled, has Miralax for PRN use. Has 1 pen remaining then plans to titrate to 2.4 mg dose. Weight on home scale is 266 lb, -6lb since last visit. Expect continued weight loss with upcoming Wegovy titration. Detailed discussion today regarding exercise which patient initiated. Reviewed that exercise is crucial in continued weight loss on Wegovy, and generally with aging (preserving muscle mass and strength). Current Regimen: Wegovy 2.7 mg weekly Since starting Wegovy in June: -11.8 kg (-26 lbs) = -8.9% TBW Diet: Continue to prioritize lean meats/veggies. We discussed incorporating snacks that are higher in protein to promote sustained satiety/energy levels mid-day.  Exercise: Sedentary. Wants to start going to the gym (feels working  full time is a barrier). We set a goal at adding small walks daily (eg 10-20 minutes at lunch, neighborhood walk after dinner). Patient reports motivation to implement more of a routine in the new year.  Increase Wegovy 2.4 mg weekly due in ~2 weeks. Already prescribed.  Miralax PRN to scheduled daily if constipation worsens.    2. ASCVD (secondary prevention): Current Regimen: atorvastatin 20 mg daily, ezetimibe 10 mg daily Key risk factors include:  Hypertension: Well controlled based on last clinic BP of 130/78, 122/80 mmHg, at goal <130/80 mmHg. Does not monitor BP at home regularly, though when he does check, BP are usually around 130/80 mmHg. Stopped using NSAIDs.  Hyperlipidemia: Well controlled per last lipid panel on 01/08/23 with LDL of 48 mg/dL which is at goal of <16 mg/dL for secondary prevention. Family history CAD  BMI >30 kg/m2: Significant and ongoing improvement with current medication regimen. BMI has decreased to <40 as of today.  Known CAD (CAC >3000,  stable angina) Continue medications today without changes.    Follow Up Follow up with clinical pharmacist as needed.  Reminder set to re-submit prior auth for [facility] in Feb 2025. PCP follow up scheduled in 2 weeks.   Future Appointments  Date Time Provider Department Center  03/30/2023  8:15 AM [facility] LAB [facility]  04/02/2023  9:45 AM [facility] ANNUAL WELLNESS VISIT 1 [facility]  04/06/2023  8:30 AM Joaquim Nam, MD [facility]  04/09/2023 11:40 AM Mariah Milling, Tollie Pizza, MD [facility] None     Loree Fee, PharmD Clinical Pharmacist [facility] [phone]

## 2023-03-28 ENCOUNTER — Other Ambulatory Visit: Payer: Managed Care, Other (non HMO) | Admitting: Pharmacist

## 2023-03-28 NOTE — Patient Instructions (Signed)
Mr. Dustin [name],   It was a pleasure to see you today! As we discussed:?   Continue Wegovy 1.7 mg under the skin once weekly until gone THEN increase dose to 2.4 mg dose once weekly   Future Appointments  Date Time Provider Department Center  03/30/2023  8:15 AM [facility] LAB [facility]  04/02/2023  9:45 AM [facility] ANNUAL WELLNESS VISIT 1 [facility]  04/06/2023  8:30 AM Joaquim Nam, MD [facility]  04/09/2023 11:40 AM Mariah Milling, Tollie Pizza, MD [facility] None   Berenice Primas, PharmD - Clinical Pharmacist

## 2023-03-30 ENCOUNTER — Other Ambulatory Visit (INDEPENDENT_AMBULATORY_CARE_PROVIDER_SITE_OTHER): Payer: Managed Care, Other (non HMO)

## 2023-03-30 DIAGNOSIS — I10 Essential (primary) hypertension: Secondary | ICD-10-CM

## 2023-03-30 DIAGNOSIS — Z125 Encounter for screening for malignant neoplasm of prostate: Secondary | ICD-10-CM | POA: Diagnosis not present

## 2023-03-30 LAB — BASIC METABOLIC PANEL
BUN: 18 mg/dL (ref 6–23)
CO2: 30 meq/L (ref 19–32)
Calcium: 9.2 mg/dL (ref 8.4–10.5)
Chloride: 100 meq/L (ref 96–112)
Creatinine, Ser: 0.91 mg/dL (ref 0.40–1.50)
GFR: 86.23 mL/min (ref >60.00)
Glucose, Bld: 92 mg/dL (ref 70–99)
Potassium: 4.1 meq/L (ref 3.5–5.1)
Sodium: 140 meq/L (ref 135–145)

## 2023-03-30 LAB — PSA, MEDICARE: PSA: 0.52 ng/mL (ref 0.10–4.00)

## 2023-04-02 ENCOUNTER — Ambulatory Visit (INDEPENDENT_AMBULATORY_CARE_PROVIDER_SITE_OTHER): Payer: Managed Care, Other (non HMO)

## 2023-04-02 VITALS — Ht 68.0 in | Wt 265.0 lb

## 2023-04-02 DIAGNOSIS — Z0000 Encounter for general adult medical examination without abnormal findings: Secondary | ICD-10-CM | POA: Diagnosis not present

## 2023-04-02 NOTE — Progress Notes (Signed)
Subjective:   Dustin [name] is a 67 y.o. male who presents for Medicare Annual/Subsequent preventive examination.  Visit Complete: Virtual I connected with  Dustin [name] on 04/02/23 by a audio enabled telemedicine application and verified that I am speaking with the correct person using two identifiers.  Patient Location: Home  Provider Location: Office/Clinic  I discussed the limitations of evaluation and management by telemedicine. The patient expressed understanding and agreed to proceed.  Vital Signs: Because this visit was a virtual/telehealth visit, some criteria may be missing or patient reported. Any vitals not documented were not able to be obtained and vitals that have been documented are patient reported.  Patient Medicare AWV questionnaire was completed by the patient on 04/02/23; I have confirmed that all information answered by patient is correct and no changes since this date.  Cardiac Risk Factors include: advanced age (>69men, >43 women);hypertension;male gender;sedentary lifestyle;obesity (BMI >30kg/m2)    Objective:    Today's Vitals   04/02/23 0949  Weight: 265 lb (120.2 kg)  Height: 5\' 8"  (1.727 m)   Body mass index is 40.29 kg/m.     04/02/2023    9:56 AM 03/31/2022   12:52 PM 01/02/2022   12:34 PM 03/29/2021   12:02 PM 03/24/2020   12:04 PM 11/21/2019    1:44 PM 02/27/2019    6:49 AM  Advanced Directives  Does Patient Have a Medical Advance Directive? No No No No No No No  Would patient like information on creating a medical advance directive?  No - Patient declined  Yes (MAU/Ambulatory/Procedural Areas - Information given) No - Patient declined  Yes (MAU/Ambulatory/Procedural Areas - Information given)    Current Medications (verified) Outpatient Encounter Medications as of 04/02/2023  Medication Sig   aspirin EC 81 MG tablet Take 1 tablet (81 mg total) by mouth daily. Swallow whole.   atorvastatin (LIPITOR) 20 MG tablet Take 1 tablet (20 mg  total) by mouth daily.   azelastine (ASTELIN) 0.1 % nasal spray Place 1 spray into both nostrils 2 (two) times daily. Use in each nostril as directed   cetirizine (ZYRTEC) 10 MG tablet Take 10 mg by mouth daily.   fluticasone (FLONASE) 50 MCG/ACT nasal spray Place 2 sprays into both nostrils daily.   hydrochlorothiazide (HYDRODIURIL) 12.5 MG tablet TAKE 1 TABLET DAILY   metoprolol succinate (TOPROL-XL) 25 MG 24 hr tablet TAKE 1 TABLET DAILY WITH ORIMMEDIATELY FOLLOWING A    MEAL   montelukast (SINGULAIR) 10 MG tablet Take 1 tablet (10 mg total) by mouth at bedtime.   naproxen (NAPROSYN) 500 MG tablet Take 1 tablet (500 mg total) by mouth 2 (two) times daily with a meal.   Semaglutide-Weight Management 1.7 MG/0.75ML SOAJ Inject 1.7 mg into the skin once a week.   Semaglutide-Weight Management 2.4 MG/0.75ML SOAJ Inject 2.4 mg into the skin once a week.   tadalafil (CIALIS) 5 MG tablet Take 0.5-1 tablets (2.5-5 mg total) by mouth daily.   amoxicillin-clavulanate (AUGMENTIN) 875-125 MG tablet Take 1 tablet by mouth 2 (two) times daily. (Patient not taking: Reported on 04/02/2023)   ezetimibe (ZETIA) 10 MG tablet Take 1 tablet (10 mg total) by mouth daily.   Semaglutide-Weight Management 0.5 MG/0.5ML SOAJ Inject 0.5 mg into the skin once a week.   Semaglutide-Weight Management 1 MG/0.5ML SOAJ Inject 1 mg into the skin once a week.   No facility-administered encounter medications on file as of 04/02/2023.    Allergies (verified) Antihistamines, diphenhydramine-type; Erythromycin; Flomax [tamsulosin]; Guaifenesin &  derivatives; and Metoprolol   History: Past Medical History:  Diagnosis Date   Allergy    Hyperlipidemia    Hypertension    Sleep apnea    Vertigo 06/03/2015   1 episode   Wears hearing aid in both ears    Past Surgical History:  Procedure Laterality Date   CATARACT EXTRACTION Bilateral    COLONOSCOPY WITH PROPOFOL     COLONOSCOPY WITH PROPOFOL N/A 05/26/2022   Procedure:  COLONOSCOPY WITH PROPOFOL;  Surgeon: Regis Bill, MD;  Location: [facility] ENDOSCOPY;  Service: Endoscopy;  Laterality: N/A;   ETHMOIDECTOMY Bilateral 02/27/2019   Procedure: ETHMOIDECTOMY;  Surgeon: Vernie Murders, MD;  Location: [facility];  Service: ENT;  Laterality: Bilateral;   FRONTAL SINUS EXPLORATION Bilateral 02/27/2019   Procedure: FRONTAL SINUS EXPLORATION;  Surgeon: Vernie Murders, MD;  Location: [facility];  Service: ENT;  Laterality: Bilateral;   IMAGE GUIDED SINUS SURGERY Bilateral 02/27/2019   Procedure: IMAGE GUIDED SINUS SURGERY;  Surgeon: Vernie Murders, MD;  Location: [facility];  Service: ENT;  Laterality: Bilateral;   LASIK Bilateral 2005   Dr Delaney Meigs   MAXILLARY ANTROSTOMY Bilateral 02/27/2019   Procedure: MAXILLARY ANTROSTOMY WITH TISSUE REMOVAL;  Surgeon: Vernie Murders, MD;  Location: [facility];  Service: ENT;  Laterality: Bilateral;   SEPTOPLASTY Bilateral 02/27/2019   Procedure: SEPTOPLASTY;  Surgeon: Vernie Murders, MD;  Location: [facility];  Service: ENT;  Laterality: Bilateral;  disk put on desk in charge nurse office, 02/06/2019 ds put 2nd disk on charge nurse desk 10-29 kp   TURBINATE REDUCTION Bilateral 02/27/2019   Procedure: TURBINATE REDUCTION;  Surgeon: Vernie Murders, MD;  Location: [facility];  Service: ENT;  Laterality: Bilateral;   TYMPANOSTOMY TUBE PLACEMENT     VASECTOMY  04/1995   Family History  Problem Relation Age of Onset   Hyperlipidemia Mother    Hypertension Mother    Coronary artery disease Father    Stroke Father    Cancer Father        prostate   Heart disease Father    Prostate cancer Father    COPD Sister    Diabetes Paternal Uncle    Diabetes Paternal Grandfather    Colon cancer Neg Hx    Social History   Socioeconomic History   Marital status: Married    Spouse name: Not on file   Number of children: 4   Years of education: Not on file   Highest education level:  Master's degree (e.g., MA, MS, MEng, MEd, MSW, MBA)  Occupational History   Occupation: Reinsurance-Marketing with companies  Tobacco Use   Smoking status: Former    Current packs/day: 0.00    Average packs/day: 0.5 packs/day for 4.0 years (2.0 ttl pk-yrs)    Types: Cigarettes    Start date: 68    Quit date: 1975    Years since quitting: 49.9   Smokeless tobacco: Never  Vaping Use   Vaping status: Never Used  Substance and Sexual Activity   Alcohol use: Yes    Alcohol/week: 0.0 standard drinks of alcohol    Comment: occ glass of wine/week   Drug use: No   Sexual activity: Not on file  Other Topics Concern   Not on file  Social History Narrative   Lives with wife, married since 1987, prev widowed   From [place], [place]   4 kids combined with his 2nd wife, 6 grandkids   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  Right handed   Social Drivers of Health   Financial Resource Strain: Low Risk  (03/31/2023)   Overall Financial Resource Strain (CARDIA)    Difficulty of Paying Living Expenses: Not hard at all  Food Insecurity: No Food Insecurity (03/31/2023)   Hunger Vital Sign    Worried About Running Out of Food in the Last Year: Never true    Ran Out of Food in the Last Year: Never true  Transportation Needs: No Transportation Needs (03/31/2023)   PRAPARE - Administrator, Civil Service (Medical): No    Lack of Transportation (Non-Medical): No  Physical Activity: Inactive (03/31/2023)   Exercise Vital Sign    Days of Exercise per Week: 0 days    Minutes of Exercise per Session: 0 min  Stress: Stress Concern Present (03/31/2023)   [employer] of Occupational Health - Occupational Stress Questionnaire    Feeling of Stress : To some extent  Social Connections: Socially Integrated (03/31/2023)   Social Connection and Isolation Panel [NHANES]    Frequency of Communication with Friends and Family: Twice a week    Frequency of Social Gatherings with  Friends and Family: Once a week    Attends Religious Services: 1 to 4 times per year    Active Member of [employer] or Organizations: Yes    Attends Engineer, structural: More than 4 times per year    Marital Status: Married    Tobacco Counseling Counseling given: Not Answered   Clinical Intake:  Pre-visit preparation completed: No  Pain : No/denies pain   BMI - recorded: 40.29 Nutritional Status: BMI > 30  Obese Nutritional Risks: None Diabetes: No  How often do you need to have someone help you when you read instructions, pamphlets, or other written materials from your doctor or pharmacy?: 1 - Never  Interpreter Needed?: No  Comments: lives with wife Information entered by :: B.Marciel Offenberger,LPN   Activities of Daily Living    03/31/2023   12:13 PM  In your present state of health, do you have any difficulty performing the following activities:  Hearing? 0  Vision? 0  Difficulty concentrating or making decisions? 0  Walking or climbing stairs? 0  Dressing or bathing? 0  Doing errands, shopping? 0  Preparing Food and eating ? N  Using the Toilet? N  In the past six months, have you accidently leaked urine? N  Do you have problems with loss of bowel control? N  Managing your Medications? N  Managing your Finances? N  Housekeeping or managing your Housekeeping? N    Patient Care Team: Joaquim Nam, MD as PCP - General Miachel Roux Huel Coventry, [facility] as Pharmacist (Pharmacist) Antonieta Iba, MD as Consulting Physician (Cardiology) Pa, [facility], [facility]  Indicate any recent Medical Services you may have received from other than [facility] providers in the past year (date may be approximate).     Assessment:   This is a routine wellness examination for [place].  Hearing/Vision screen Hearing Screening - Comments:: Pt says his hearing is good with hearing aids;has mild loss Vision Screening - Comments:: Pt says wears glasses to  drive:cataract surgery_[facility]   Goals Addressed             This Visit's Progress    Patient Stated   On track    03/24/2020, I will maintain and continue medications as prescribed.      Patient Stated   Not on track  Would like to eat healthier, drink more water and walk more       Depression Screen    04/02/2023    9:54 AM 09/07/2022   12:12 PM 03/31/2022   12:52 PM 03/29/2021   12:06 PM 03/29/2020    3:39 PM 03/24/2020   12:10 PM 02/18/2018    3:18 PM  PHQ 2/9 Scores  PHQ - 2 Score 0 0 0 0 0 0 0  PHQ- 9 Score  0   0 0     Fall Risk    03/31/2023   12:13 PM 09/07/2022   12:12 PM 03/31/2022   12:51 PM 03/27/2022    8:42 AM 03/29/2021   12:04 PM  Fall Risk   Falls in the past year? 0 0 0 0 0  Number falls in past yr: 0 0  0 0  Injury with Fall? 0 0  0 0  Risk for fall due to :  No Fall Risks   No Fall Risks  Follow up  Falls evaluation completed Falls evaluation completed;Education provided  Falls prevention discussed    MEDICARE RISK AT HOME: Medicare Risk at Home Any stairs in or around the home?: Yes If so, are there any without handrails?: No Home free of loose throw rugs in walkways, pet beds, electrical cords, etc?: Yes Adequate lighting in your home to reduce risk of falls?: Yes Life alert?: No Use of a cane, walker or w/c?: No Grab bars in the bathroom?: No Shower chair or bench in shower?: No Elevated toilet seat or a handicapped toilet?: No  TIMED UP AND GO:  Was the test performed?  No    Cognitive Function:    03/24/2020   12:11 PM  MMSE - Mini Mental State Exam  Orientation to time 5  Orientation to Place 5  Registration 3  Attention/ Calculation 5  Recall 3  Language- repeat 1        04/02/2023    9:58 AM 03/31/2022   12:53 PM  6CIT Screen  What Year? 0 points 0 points  What month? 0 points 0 points  What time? 0 points 0 points  Count back from 20 0 points 0 points  Months in reverse 0 points 2 points   Repeat phrase 0 points 0 points  Total Score 0 points 2 points    Immunizations Immunization History  Administered Date(s) Administered   Fluad Quad(high Dose 65+) 04/07/2021, 04/04/2022   Influenza Whole 02/11/2013   Influenza, High Dose Seasonal PF 03/29/2020   Influenza,inj,Quad PF,6+ Mos 02/09/2015, 02/02/2016, 01/05/2017, 02/18/2018   Influenza,inj,quad, With Preservative 01/25/2019   Influenza-Unspecified 01/14/2014   Moderna Sars-Covid-2 Vaccination 06/18/2019, 07/16/2019, 04/30/2020   Pneumococcal Polysaccharide-23 03/29/2020   Td 09/30/2001, 11/01/2018   Tdap 12/14/2011   Zoster Recombinant(Shingrix) 12/29/2017, 03/23/2018    TDAP status: Up to date  Flu Vaccine status: Due, Education has been provided regarding the importance of this vaccine. Advised may receive this vaccine at local pharmacy or Health Dept. Aware to provide a copy of the vaccination record if obtained from local pharmacy or Health Dept. Verbalized acceptance and understanding.  Pneumococcal vaccine status: Up to date  Covid-19 vaccine status: Completed vaccines  Qualifies for Shingles Vaccine? Yes   Zostavax completed Yes   Shingrix Completed?: Yes  Screening Tests Health Maintenance  Topic Date Due   INFLUENZA VACCINE  07/16/2023 (Originally 11/16/2022)   Pneumonia Vaccine 68+ Years old (2 of 2 - PCV) 04/01/2024 (Originally 03/29/2021)   Colonoscopy  05/27/2027  DTaP/Tdap/Td (4 - Td or Tdap) 10/31/2028   Hepatitis C Screening  Completed   Zoster Vaccines- Shingrix  Completed   HPV VACCINES  Aged Out   COVID-19 Vaccine  Discontinued    Health Maintenance  There are no preventive care reminders to display for this patient.   Colorectal cancer screening: Type of screening: Colonoscopy. Completed 05/26/22. Repeat every 5 years  Lung Cancer Screening: (Low Dose CT Chest recommended if Age 93-80 years, 20 pack-year currently smoking OR have quit w/in 15years.) does qualify.   Lung Cancer  Screening Referral: no  Additional Screening:  Hepatitis C Screening: does not qualify; Completed 01/31/2016  Vision Screening: Recommended annual ophthalmology exams for early detection of glaucoma and other disorders of the eye. Is the patient up to date with their annual eye exam?  Yes  Who is the provider or what is the name of the office in which the patient attends annual eye exams? [facility] If pt is not established with a provider, would they like to be referred to a provider to establish care? No .   Dental Screening: Recommended annual dental exams for proper oral hygiene  Diabetic Foot Exam: n/a  Community Resource Referral / Chronic Care Management: CRR required this visit?  No   CCM required this visit?  No     Plan:     I have personally reviewed and noted the following in the patient's chart:   Medical and social history Use of alcohol, tobacco or illicit drugs  Current medications and supplements including opioid prescriptions. Patient is not currently taking opioid prescriptions. Functional ability and status Nutritional status Physical activity Advanced directives List of other physicians Hospitalizations, surgeries, and ER visits in previous 12 months Vitals Screenings to include cognitive, depression, and falls Referrals and appointments  In addition, I have reviewed and discussed with patient certain preventive protocols, quality metrics, and best practice recommendations. A written personalized care plan for preventive services as well as general preventive health recommendations were provided to patient.     Sue Lush, LPN   [id number]   After Visit Summary: (MyChart) Due to this being a telephonic visit, the after visit summary with patients personalized plan was offered to patient via MyChart   Nurse Notes: The patient states he is doing well and has no concerns or questions at this time.

## 2023-04-02 NOTE — Patient Instructions (Signed)
Dustin [name] , Thank you for taking time to come for your Medicare Wellness Visit. I appreciate your ongoing commitment to your health goals. Please review the following plan we discussed and let me know if I can assist you in the future.   Referrals/Orders/Follow-Ups/Clinician Recommendations: non  This is a list of the screening recommended for you and due dates:  Health Maintenance  Topic Date Due   Pneumonia Vaccine (2 of 2 - PCV) 03/29/2021   Flu Shot  11/16/2022   Colon Cancer Screening  05/27/2027   DTaP/Tdap/Td vaccine (4 - Td or Tdap) 10/31/2028   Hepatitis C Screening  Completed   Zoster (Shingles) Vaccine  Completed   HPV Vaccine  Aged Out   COVID-19 Vaccine  Discontinued    Advanced directives: (Declined) Advance directive discussed with you today. Even though you declined this today, please call our office should you change your mind, and we can give you the proper paperwork for you to fill out. Pt says it is on his TO DO list.  Next Medicare Annual Wellness Visit scheduled for next year: Yes 04/02/24 @ 9:30am telephone

## 2023-04-06 ENCOUNTER — Ambulatory Visit (INDEPENDENT_AMBULATORY_CARE_PROVIDER_SITE_OTHER): Payer: Managed Care, Other (non HMO) | Admitting: Family Medicine

## 2023-04-06 ENCOUNTER — Encounter: Payer: Self-pay | Admitting: Family Medicine

## 2023-04-06 VITALS — BP 138/78 | HR 77 | Temp 98.2°F | Ht 68.0 in | Wt 270.6 lb

## 2023-04-06 DIAGNOSIS — G4733 Obstructive sleep apnea (adult) (pediatric): Secondary | ICD-10-CM | POA: Diagnosis not present

## 2023-04-06 DIAGNOSIS — Z0000 Encounter for general adult medical examination without abnormal findings: Secondary | ICD-10-CM

## 2023-04-06 DIAGNOSIS — I25118 Atherosclerotic heart disease of native coronary artery with other forms of angina pectoris: Secondary | ICD-10-CM | POA: Diagnosis not present

## 2023-04-06 DIAGNOSIS — I10 Essential (primary) hypertension: Secondary | ICD-10-CM

## 2023-04-06 DIAGNOSIS — Z7189 Other specified counseling: Secondary | ICD-10-CM

## 2023-04-06 NOTE — Patient Instructions (Addendum)
If fatigued or lightheaded at all, then you could stop hydrochlorothiazide and see how you feel/check your BP at home.   Take care.  Glad to see you.

## 2023-04-06 NOTE — Progress Notes (Unsigned)
Elevated Cholesterol: Using medications without problems: yes Muscle aches: some cramping at night but not consistent Diet compliance: d/w pt.  Exercise: d/w pt.  Labs d/w pt.    Some R 3rd toe sharp pain that improved with nervive.  Intermittent, unpredictable.  He can still feel the floor, no foot drop.  I asked him to update me as needed.  Hypertension:    Using medication without problems or lightheadedness: yes Chest pain with exertion:no Edema:no Short of breath:no  He has cardiology f/u pending.   Taking wegovy.  No ADE on med.  Lower appetite.  He feels better in general.  D/w pt about exercise.   Weight loss noted.  Down 18 lbs from last year.    He had to stop CPAP due to intolerance.  His weight is lower and he has device to monitor nocturnal O2 level.    Prev AAA screening neg.   Tetanus 2020 Flu 2024 PNA 2021 Shingles prev done.   covid vaccine 2021 HIV and HCV neg prev.   PSA wnl.  Dw pt.  FH noted.   Colonoscopy 2024 Living will d/w pt.  Wife designated if patient were incapacitated.    Diet and exercise d/w pt.  Encouraged both, discussed.  Discussed that a friend of his recently passed away unexpectedly.  Condolences offered.  Discussed.  Meds, vitals, and allergies reviewed.   ROS: Per HPI unless specifically indicated in ROS section   GEN: nad, alert and oriented HEENT: ncat NECK: supple w/o LA CV: rrr.  PULM: ctab, no inc wob ABD: soft, +bs EXT: no edema SKIN: no acute rash  30 minutes were devoted to patient care in this encounter (this includes time spent reviewing the patient's file/history, interviewing and examining the patient, counseling/reviewing plan with patient).

## 2023-04-08 NOTE — Progress Notes (Unsigned)
Cardiology Office Note  Date:  04/09/2023   ID:  Allyson, Holzhausen 11-26-1955, MRN [id number]  PCP:  Joaquim Nam, MD   Chief Complaint  Patient presents with   6 month follow up     "Doing well." Medications reviewed by the patient verbally.     HPI:  Mr. Dustin [name] is a 67 year old gentleman with past medical history of Sleep apnea Essential hypertension Hyperlipidemia Coronary calcification on CT scan Former smoker, no diabetes Who presents for f/u of his coronary calcification  Last seen by myself in clinic October 13, 2022  In follow-up today reports he feels well, denies chest pain concerning for angina Active, no regular exercise program No shortness of breath on exertion  Tolerating Lipitor 20 and Zetia Weight down 20 pounds compared to prior clinic visit on Wegovy injection  Prior testing reviewed CT scan performed September 21, 2022 Aortic atherosclerosis Calcium score 3300 LAD 1039 LCx 655 RCA 1609  Stress test November 02, 2022 Low risk, no significant ischemia  Lab work reviewed Total cholesterol 108 LDL 48 Currently on Lipitor 20, zetia 10 daily  Father with CAD, MI age 71s Uncle with aorta aneurysm Brother 10 MI, CAD  EKG personally reviewed by myself on todays visit EKG Interpretation Date/Time:  Monday April 09 2023 11:39:01 EST Ventricular Rate:  64 PR Interval:  170 QRS Duration:  96 QT Interval:  418 QTC Calculation: 431 R Axis:   -10  Text Interpretation: Normal sinus rhythm Normal ECG When compared with ECG of 13-Oct-2022 08:32, No significant change was found Confirmed by Julien Nordmann [phone]) on 04/09/2023 11:56:14 AM    PMH:   has a past medical history of Allergy, Hyperlipidemia, Hypertension, Sleep apnea, Vertigo (06/03/2015), and Wears hearing aid in both ears.  PSH:    Past Surgical History:  Procedure Laterality Date   CATARACT EXTRACTION Bilateral    COLONOSCOPY WITH PROPOFOL     COLONOSCOPY WITH PROPOFOL N/A  05/26/2022   Procedure: COLONOSCOPY WITH PROPOFOL;  Surgeon: Regis Bill, MD;  Location: [facility] END[facility]COPY;  Service: Endoscopy;  Laterality: N/A;   ETHMOIDECTOMY Bilateral 02/27/2019   Procedure: ETHMOIDECTOMY;  Surgeon: Vernie Murders, MD;  Location: [facility];  Service: ENT;  Laterality: Bilateral;   FRONTAL SINUS EXPLORATION Bilateral 02/27/2019   Procedure: FRONTAL SINUS EXPLORATION;  Surgeon: Vernie Murders, MD;  Location: [facility];  Service: ENT;  Laterality: Bilateral;   IMAGE GUIDED SINUS SURGERY Bilateral 02/27/2019   Procedure: IMAGE GUIDED SINUS SURGERY;  Surgeon: Vernie Murders, MD;  Location: [facility];  Service: ENT;  Laterality: Bilateral;   LASIK Bilateral 2005   Dr Delaney Meigs   MAXILLARY ANTROSTOMY Bilateral 02/27/2019   Procedure: MAXILLARY ANTROSTOMY WITH TISSUE REMOVAL;  Surgeon: Vernie Murders, MD;  Location: [facility];  Service: ENT;  Laterality: Bilateral;   SEPTOPLASTY Bilateral 02/27/2019   Procedure: SEPTOPLASTY;  Surgeon: Vernie Murders, MD;  Location: [facility];  Service: ENT;  Laterality: Bilateral;  disk put on desk in charge nurse office, 02/06/2019 ds put 2nd disk on charge nurse desk 10-29 kp   TURBINATE REDUCTION Bilateral 02/27/2019   Procedure: TURBINATE REDUCTION;  Surgeon: Vernie Murders, MD;  Location: [facility];  Service: ENT;  Laterality: Bilateral;   TYMPANOSTOMY TUBE PLACEMENT     VASECTOMY  04/1995    Current Outpatient Medications  Medication Sig Dispense Refill   aspirin EC 81 MG tablet Take 1 tablet (81 mg total) by mouth daily. Swallow whole.  atorvastatin (LIPITOR) 20 MG tablet Take 1 tablet (20 mg total) by mouth daily. 90 tablet 3   azelastine (ASTELIN) 0.1 % nasal spray Place 1 spray into both nostrils 2 (two) times daily. Use in each nostril as directed 30 mL 0   cetirizine (ZYRTEC) 10 MG tablet Take 10 mg by mouth daily.     ezetimibe (ZETIA) 10 MG tablet Take 1 tablet (10  mg total) by mouth daily. 90 tablet 3   fluticasone (FLONASE) 50 MCG/ACT nasal spray Place 2 sprays into both nostrils daily. 16 g 6   metoprolol succinate (TOPROL-XL) 25 MG 24 hr tablet TAKE 1 TABLET DAILY WITH ORIMMEDIATELY FOLLOWING A    MEAL 90 tablet 1   montelukast (SINGULAIR) 10 MG tablet Take 1 tablet (10 mg total) by mouth at bedtime. 90 tablet 3   naproxen (NAPROSYN) 500 MG tablet Take 1 tablet (500 mg total) by mouth 2 (two) times daily with a meal. 30 tablet 0   Semaglutide-Weight Management 1.7 MG/0.75ML SOAJ Inject 1.7 mg into the skin once a week. 3 mL 2   tadalafil (CIALIS) 5 MG tablet Take 0.5-1 tablets (2.5-5 mg total) by mouth daily. 30 tablet 11   No current facility-administered medications for this visit.     Allergies:   Antihistamines, diphenhydramine-type; Erythromycin; Flomax [tamsulosin]; Guaifenesin & derivatives; and Metoprolol   Social History:  The patient  reports that he quit smoking about 50 years ago. His smoking use included cigarettes. He started smoking about 54 years ago. He has a 2 pack-year smoking history. He has never used smokeless tobacco. He reports current alcohol use. He reports that he does not use drugs.   Family History:   family history includes COPD in his sister; Cancer in his father; Coronary artery disease in his father; Diabetes in his paternal grandfather and paternal uncle; Heart disease in his father; Hyperlipidemia in his mother; Hypertension in his mother; Prostate cancer in his father; Stroke in his father.    Review of Systems: Review of Systems  Constitutional: Negative.   HENT: Negative.    Respiratory: Negative.    Cardiovascular: Negative.   Gastrointestinal: Negative.   Musculoskeletal: Negative.   Neurological: Negative.   Psychiatric/Behavioral: Negative.    All other systems reviewed and are negative.   PHYSICAL EXAM: VS:  BP (!) 144/84 (BP Location: Left Arm, Patient Position: Sitting, Cuff Size: Large)   Pulse  64   Ht 5\' 8"  (1.727 m)   Wt 276 lb 4 oz (125.3 kg)   SpO2 95%   BMI 42.00 kg/m  , BMI Body mass index is 42 kg/m. Constitutional:  oriented to person, place, and time. No distress.  HENT:  Head: Grossly normal Eyes:  no discharge. No scleral icterus.  Neck: No JVD, no carotid bruits  Cardiovascular: Regular rate and rhythm, no murmurs appreciated Pulmonary/Chest: Clear to auscultation bilaterally, no wheezes or rails Abdominal: Soft.  no distension.  no tenderness.  Musculoskeletal: Normal range of motion Neurological:  normal muscle tone. Coordination normal. No atrophy Skin: Skin warm and dry Psychiatric: normal affect, pleasant  Recent Labs: 01/08/2023: ALT 20 03/30/2023: BUN 18; Creatinine, Ser 0.91; Potassium 4.1; Sodium 140    Lipid Panel Lab Results  Component Value Date   CHOL 108 01/08/2023   HDL 43 01/08/2023   LDLCALC 48 01/08/2023   TRIG 85 01/08/2023    Wt Readings from Last 3 Encounters:  04/09/23 276 lb 4 oz (125.3 kg)  04/06/23 270 lb 9.6 oz (122.7  kg)  04/02/23 265 lb (120.2 kg)     ASSESSMENT AND PLAN:  Problem List Items Addressed This Visit       Cardiology Problems   Essential hypertension   Relevant Orders   EKG 12-Lead (Completed)   CAD (coronary artery disease) - Primary   Relevant Orders   EKG 12-Lead (Completed)     Other   OSA (obstructive sleep apnea)   Other Visit Diagnoses       Mixed hyperlipidemia         Aortic atherosclerosis (HCC)       Relevant Orders   EKG 12-Lead (Completed)     Precordial pain          Cad with stable angina Prior smoking history Heavy coronary calcification on CT scan, calcium score greater than 3000 Strong family history of coronary disease, brother with sudden death, father with coronary disease and MI He denies significant chest pain concerning for angina Prior Myoview low risk Cholesterol at goal, no further workup at this time  Hyperlipidemia Continue Lipitor 20 with Zetia 10  daily, cholesterol at goal LDL less than 50  Essential hypertension Blood pressure high end of his range, recommend he continue current medications and monitor blood pressure at home, call us if it continues to run high  Morbid obesity Weight down 20 pounds on Wegovy Recommend walking program  Aortic atherosclerosis Prior smoking history Very mild, seen on calcium score CT scan images Cholesterol at goal    Signed, Dossie Arbour, M.D., Ph.D. [facility] [place], [place] [phone]

## 2023-04-08 NOTE — Assessment & Plan Note (Signed)
He did not tolerate CPAP.  His weight is lower and he still able to monitor his nocturnal O2 levels.  Continue working on weight loss with diet exercise and Wegovy use.

## 2023-04-08 NOTE — Assessment & Plan Note (Signed)
If he is lightheaded at all then he could stop hydrochlorothiazide.  Continue metoprolol.

## 2023-04-08 NOTE — Assessment & Plan Note (Signed)
Living will d/w pt.  Wife designated if patient were incapacitated.   ?

## 2023-04-08 NOTE — Assessment & Plan Note (Signed)
Prev AAA screening neg.   Tetanus 2020 Flu 2024 PNA 2021 Shingles prev done.   covid vaccine 2021 HIV and HCV neg prev.   PSA wnl.  Dw pt.  FH noted.   Colonoscopy 2024 Living will d/w pt.  Wife designated if patient were incapacitated.    Diet and exercise d/w pt.  Encouraged both, discussed.

## 2023-04-08 NOTE — Assessment & Plan Note (Addendum)
No chest pain, has cardiology follow-up pending.  Continue Lipitor.  Continue metoprolol aspirin Zetia.  Continue working on weight loss with diet exercise and Wegovy use.

## 2023-04-09 ENCOUNTER — Ambulatory Visit: Payer: Managed Care, Other (non HMO) | Attending: Cardiovascular Disease | Admitting: Cardiovascular Disease

## 2023-04-09 ENCOUNTER — Encounter: Payer: Self-pay | Admitting: Cardiovascular Disease

## 2023-04-09 VITALS — BP 144/84 | HR 64 | Ht 68.0 in | Wt 276.2 lb

## 2023-04-09 DIAGNOSIS — G4733 Obstructive sleep apnea (adult) (pediatric): Secondary | ICD-10-CM

## 2023-04-09 DIAGNOSIS — R072 Precordial pain: Secondary | ICD-10-CM

## 2023-04-09 DIAGNOSIS — E782 Mixed hyperlipidemia: Secondary | ICD-10-CM | POA: Diagnosis not present

## 2023-04-09 DIAGNOSIS — I25118 Atherosclerotic heart disease of native coronary artery with other forms of angina pectoris: Secondary | ICD-10-CM | POA: Diagnosis not present

## 2023-04-09 DIAGNOSIS — I10 Essential (primary) hypertension: Secondary | ICD-10-CM | POA: Diagnosis not present

## 2023-04-09 DIAGNOSIS — I700 Atherosclerosis of aorta: Secondary | ICD-10-CM

## 2023-04-09 MED ORDER — EZETIMIBE 10 MG PO TABS: 10.0000 mg | ORAL_TABLET | Freq: Every day | ORAL | 3 refills | Status: DC

## 2023-04-09 NOTE — Patient Instructions (Addendum)
Medication Instructions:  Your physician recommends that you continue on your current medications as directed. Please refer to the Current Medication list given to you today.  If you need a refill on your cardiac medications before your next appointment, please call your pharmacy.   Lab work: No new labs needed  Testing/Procedures: No new testing needed  Follow-Up: At [facility], you and your health needs are our priority.  As part of our continuing mission to provide you with exceptional heart care, we have created designated Provider Care Teams.  These Care Teams include your primary Cardiologist (physician) and Advanced Practice Providers (APPs -  Physician Assistants and Nurse Practitioners) who all work together to provide you with the care you need, when you need it.  You will need a follow up appointment in 12 months  Providers on your designated Care Team:   Christopher Berge, NP Ryan Dunn, PA-C Cadence Furth, PA-C  COVID-19 Vaccine Information can be found at: [email or web address] For questions related to vaccine distribution or appointments, please email [email or web address] or call [phone].   

## 2023-04-19 ENCOUNTER — Telehealth: Payer: Managed Care, Other (non HMO) | Admitting: Family Medicine

## 2023-04-19 DIAGNOSIS — R6889 Other general symptoms and signs: Secondary | ICD-10-CM

## 2023-04-19 NOTE — Progress Notes (Signed)
E visit for Flu like symptoms   We are sorry that you are not feeling well.  Here is how we plan to help! Based on what you have shared with me it looks like you may have flu-like symptoms that should be watched but do not seem to indicate anti-viral treatment.  Influenza or "the flu" is   an infection caused by a respiratory virus. The flu virus is highly contagious and persons who did not receive their yearly flu vaccination may "catch" the flu from close contact.  We have anti-viral medications to treat the viruses that cause this infection. They are not a "cure" and only shorten the course of the infection. These prescriptions are most effective when they are given within the first 2 days of "flu" symptoms. Antiviral medication are indicated if you have a high risk of complications from the flu. You should  also consider an antiviral medication if you are in close contact with someone who is at risk. These medications can help patients avoid complications from the flu  but have side effects that you should know. Possible side effects from Tamiflu or oseltamivir include nausea, vomiting, diarrhea, dizziness, headaches, eye redness, sleep problems or other respiratory symptoms. You should not take Tamiflu if you have an allergy to oseltamivir or any to the ingredients in Tamiflu.  Based upon your symptoms and potential risk factors I recommend that you follow the flu symptoms recommendation that I have listed below.  ANYONE WHO HAS FLU SYMPTOMS SHOULD: Stay home. The flu is highly contagious and going out or to work exposes others! Be sure to drink plenty of fluids. Water is fine as well as fruit juices, sodas and electrolyte beverages. You may want to stay away from caffeine or alcohol. If you are nauseated, try taking small sips of liquids. How do you know if you are getting enough fluid? Your urine should be a pale yellow or almost colorless. Get rest. Taking a steamy shower or using a  humidifier may help nasal congestion and ease sore throat pain. Using a saline nasal spray works much the same way. Cough drops, hard candies and sore throat lozenges may ease your cough. Line up a caregiver. Have someone check on you regularly.   GET HELP RIGHT AWAY IF: You cannot keep down liquids or your medications. You become short of breath Your fell like you are going to pass out or loose consciousness. Your symptoms persist after you have completed your treatment plan MAKE SURE YOU  Understand these instructions. Will watch your condition. Will get help right away if you are not doing well or get worse.  Your e-visit answers were reviewed by a board certified advanced clinical practitioner to complete your personal care plan.  Depending on the condition, your plan could have included both over the counter or prescription medications.  If there is a problem please reply  once you have received a response from your provider.  Your safety is important to us.  If you have drug allergies check your prescription carefully.    You can use MyChart to ask questions about today's visit, request a non-urgent call back, or ask for a work or school excuse for 24 hours related to this e-Visit. If it has been greater than 24 hours you will need to follow up with your provider, or enter a new e-Visit to address those concerns.  You will get an e-mail in the next two days asking about your experience.  I hope that   your e-visit has been valuable and will speed your recovery. Thank you for using e-visits.  I provided 5 minutes of non face-to-face time during this encounter for chart review, medication and order placement, as well as and documentation.

## 2023-04-21 ENCOUNTER — Telehealth: Payer: Managed Care, Other (non HMO) | Admitting: Nurse Practitioner

## 2023-04-21 DIAGNOSIS — J019 Acute sinusitis, unspecified: Secondary | ICD-10-CM

## 2023-04-21 DIAGNOSIS — B9689 Other specified bacterial agents as the cause of diseases classified elsewhere: Secondary | ICD-10-CM | POA: Diagnosis not present

## 2023-04-21 MED ORDER — AMOXICILLIN-POT CLAVULANATE 875-125 MG PO TABS
1.0000 | ORAL_TABLET | Freq: Two times a day (BID) | ORAL | 0 refills | Status: AC
Start: 2023-04-21 — End: 2023-04-28

## 2023-04-21 NOTE — Progress Notes (Signed)
 I have spent 5 minutes in review of e-visit questionnaire, review and updating patient chart, medical decision making and response to patient.   Claiborne Rigg, NP

## 2023-04-21 NOTE — Progress Notes (Signed)

## 2023-04-26 ENCOUNTER — Other Ambulatory Visit: Payer: Self-pay | Admitting: Family Medicine

## 2023-04-26 NOTE — Telephone Encounter (Signed)
 Copied from CRM [id number]. Topic: Clinical - Medication Refill >> Apr 26, 2023  4:06 PM Corin V wrote: Most Recent Primary Care Visit:  Provider: CLEATUS ARLYSS RAMAN  Department: [facility]  Visit Type: PHYSICAL  Date: 04/06/2023  Medication: Semaglutide -Weight Management 1.7 MG/0.75ML SOAJ- per patietn he should be titrated up to 2.4MG  now  Has the patient contacted their pharmacy? Yes (Agent: If no, request that the patient contact the pharmacy for the refill. If patient does not wish to contact the pharmacy document the reason why and proceed with request.) (Agent: If yes, when and what did the pharmacy advise?)  Is this the correct pharmacy for this prescription? Yes If no, delete pharmacy and type the correct one.  This is the patient's preferred pharmacy:  [facility] [address] [place] [address] Phone: [phone] Fax: [phone]  Has the prescription been filled recently? No  Is the patient out of the medication? Yes  Has the patient been seen for an appointment in the last year OR does the patient have an upcoming appointment? Yes  Can we respond through MyChart? Yes  Agent: Please be advised that Rx refills may take up to 3 business days. We ask that you follow-up with your pharmacy.

## 2023-04-29 MED ORDER — WEGOVY 2.4 MG/0.75ML ~~LOC~~ SOAJ
2.4000 mg | SUBCUTANEOUS | 3 refills | Status: DC
  Filled 2023-04-30: qty 3, 28d supply, fill #0
  Filled 2023-05-23: qty 3, 28d supply, fill #1
  Filled 2023-06-25: qty 3, 28d supply, fill #2
  Filled 2023-07-23 – 2023-07-24 (×4): qty 3, 28d supply, fill #3

## 2023-04-29 NOTE — Telephone Encounter (Signed)
 I sent the higher dose.  Please let me know if not tolerated.  Thanks.

## 2023-04-30 ENCOUNTER — Other Ambulatory Visit: Payer: Self-pay

## 2023-04-30 ENCOUNTER — Other Ambulatory Visit (HOSPITAL_COMMUNITY): Payer: Self-pay

## 2023-05-01 ENCOUNTER — Other Ambulatory Visit: Payer: Self-pay

## 2023-05-01 ENCOUNTER — Telehealth: Payer: Managed Care, Other (non HMO) | Admitting: Physician Assistant

## 2023-05-01 DIAGNOSIS — J329 Chronic sinusitis, unspecified: Secondary | ICD-10-CM

## 2023-05-01 NOTE — Progress Notes (Signed)
 Because of recurring symptoms despite treatment via e-visit, I feel your condition warrants further evaluation and I recommend that you be seen in a face to face visit.   NOTE: There will be NO CHARGE for this E-Visit   If you are having a true medical emergency please call 911.     For an urgent face to face visit, [place] has multiple urgent care centers for your convenience.   [facility] at [facility] Your location to [facility] Urgent Care at [facility] [phone] [place], [place] [address]   Urgent Care at [facility] [place], [place]  [place] Your location to [facility] Urgent Care at [facility] [phone] [address] [place],  [place]  [address]  [facility] Urgent Care at [facility]) Get Driving Directions [phone] [address], [address] [place], [address]    [facility] Health Urgent Care Center at [facility] Get Driving Directions [phone] [address] [place], [place] [address]   [facility] Health Urgent [facility]) Get Driving Directions [phone] [address] [place], [place] [address]  [facility] Urgent Care Center [facility] [place]) Get Driving Directions [phone] [address] [place],  [place]  [address]  [facility] Health Urgent Care Center [facility] - at [employer]  [phone] [address] [place],  [place] [address]   [facility] Health Urgent Care at [facility] Get Driving Directions [phone] [address], [address] [place], [place] [address]   [facility] Health Urgent Care at [facility] Get Driving Directions  [phone] [address] [place], [place] [address]   [facility] Health Urgent Care at [facility] Directions [phone] [address], [address] [place], [place] [address]  Your MyChart E-visit questionnaire answers were reviewed by a board certified advanced clinical practitioner to complete your personal care plan based on your specific symptoms.  Thank you for using e-Visits.

## 2023-05-02 ENCOUNTER — Ambulatory Visit
Admission: RE | Admit: 2023-05-02 | Discharge: 2023-05-02 | Disposition: A | Discharge status: Home / Self Care | Payer: Managed Care, Other (non HMO) | Source: Ambulatory Visit | Attending: Emergency Medicine | Admitting: Emergency Medicine

## 2023-05-02 VITALS — BP 124/74 | HR 75 | Temp 97.6°F | Resp 18

## 2023-05-02 DIAGNOSIS — J329 Chronic sinusitis, unspecified: Secondary | ICD-10-CM

## 2023-05-02 MED ORDER — PREDNISONE 10 MG (21) PO TBPK: ORAL_TABLET | Freq: Every day | ORAL | 0 refills | Status: DC

## 2023-05-02 MED ORDER — DOXYCYCLINE HYCLATE 100 MG PO CAPS: 100.0000 mg | ORAL_CAPSULE | Freq: Two times a day (BID) | ORAL | 0 refills | Status: AC

## 2023-05-02 NOTE — Discharge Instructions (Addendum)
Take the doxycycline and prednisone as directed.  Follow up with your primary care provider or ENT.

## 2023-05-02 NOTE — ED Triage Notes (Signed)
 Patient to Urgent Care with complaints of nasal congestion/ sinus pain and pressure/ cough/ right sided ear ache that started two weeks+ ago. Denies any known fevers.   7 days of amoxicillin  (completed over the weekend)/ using sinus rinse/ flonase .

## 2023-05-02 NOTE — ED Provider Notes (Signed)
 Dustin [name]    CSN: [id number] Arrival date & time: 05/02/23  8469      History   Chief Complaint Chief Complaint  Patient presents with   Nasal Congestion    Recurring sinus infection - Entered by patient    HPI Dustin [name] is a 68 y.o. male.  Patient presents with 2-week history of sinus pressure, congestion, ear pain, cough.  No fever or shortness of breath.  He reports history of recurrent sinusitis.  He reports history of sinus surgery and is followed by ENT.  Patient had an e-visit on 04/19/2023; diagnosed with flulike symptoms; treated symptomatically.  Patient had another e-visit on 04/21/2023; diagnosed with acute sinusitis and treated with Augmentin .  He had another e-visit yesterday for recurrent sinusitis and was instructed to be seen in person.  The history is provided by the patient and medical records.    Past Medical History:  Diagnosis Date   Allergy    Hyperlipidemia    Hypertension    Sleep apnea    Vertigo 06/03/2015   1 episode   Wears hearing aid in both ears     Patient Active Problem List   Diagnosis Date Noted   CAD (coronary artery disease) 09/24/2022   Mucous retention cyst of maxillary sinus 09/04/2022   Recurrent sinus infections 04/05/2022   OSA (obstructive sleep apnea) 11/22/2021   Erectile dysfunction 04/13/2021   Elbow pain 04/13/2021   Healthcare maintenance 03/31/2020   Nocturia 10/21/2019   Paresthesia 09/24/2019   Snoring 02/20/2018   Advance care planning 02/08/2014   Family history of prostate cancer 11/18/2010   Essential hypertension 02/19/2007   Allergic rhinitis 02/19/2007    Past Surgical History:  Procedure Laterality Date   CATARACT EXTRACTION Bilateral    COLONOSCOPY WITH PROPOFOL      COLONOSCOPY WITH PROPOFOL  N/A 05/26/2022   Procedure: COLONOSCOPY WITH PROPOFOL ;  Surgeon: Shane Darling, MD;  Location: [facility] END[facility];  Service: Endoscopy;  Laterality: N/A;   ETHMOIDECTOMY Bilateral 02/27/2019    Procedure: ETHMOIDECTOMY;  Surgeon: Mellody Sprout, MD;  Location: [facility];  Service: ENT;  Laterality: Bilateral;   FRONTAL SINUS EXPLORATION Bilateral 02/27/2019   Procedure: FRONTAL SINUS EXPLORATION;  Surgeon: Mellody Sprout, MD;  Location: [facility];  Service: ENT;  Laterality: Bilateral;   IMAGE GUIDED SINUS SURGERY Bilateral 02/27/2019   Procedure: IMAGE GUIDED SINUS SURGERY;  Surgeon: Mellody Sprout, MD;  Location: [facility];  Service: ENT;  Laterality: Bilateral;   LASIK Bilateral 2005   Dr Kirkland Peppers   MAXILLARY ANTROSTOMY Bilateral 02/27/2019   Procedure: MAXILLARY ANTROSTOMY WITH TISSUE REMOVAL;  Surgeon: Mellody Sprout, MD;  Location: [facility];  Service: ENT;  Laterality: Bilateral;   SEPTOPLASTY Bilateral 02/27/2019   Procedure: SEPTOPLASTY;  Surgeon: Mellody Sprout, MD;  Location: [facility];  Service: ENT;  Laterality: Bilateral;  disk put on desk in charge nurse office, 02/06/2019 ds put 2nd disk on charge nurse desk 10-29 kp   TURBINATE REDUCTION Bilateral 02/27/2019   Procedure: TURBINATE REDUCTION;  Surgeon: Mellody Sprout, MD;  Location: [facility];  Service: ENT;  Laterality: Bilateral;   TYMPANOSTOMY TUBE PLACEMENT     VASECTOMY  04/1995       Home Medications    Prior to Admission medications   Medication Sig Start Date End Date Taking? Authorizing Provider  doxycycline  (VIBRAMYCIN ) 100 MG capsule Take 1 capsule (100 mg total) by mouth 2 (two) times daily for 7 days. 05/02/23 05/09/23 Yes Leanor Proper  H, NP  predniSONE  (STERAPRED UNI-PAK 21 TAB) 10 MG (21) TBPK tablet Take by mouth daily. As directed 05/02/23  Yes Wellington Half, NP  aspirin  EC 81 MG tablet Take 1 tablet (81 mg total) by mouth daily. Swallow whole. 09/24/22   Donnie Galea, MD  atorvastatin  (LIPITOR) 20 MG tablet Take 1 tablet (20 mg total) by mouth daily. 11/16/22   Donnie Galea, MD  azelastine  (ASTELIN ) 0.1 % nasal spray Place 1 spray into  both nostrils 2 (two) times daily. Use in each nostril as directed 03/17/22   Angelia Kelp, PA-C  cetirizine (ZYRTEC) 10 MG tablet Take 10 mg by mouth daily.    [provider]  ezetimibe  (ZETIA ) 10 MG tablet Take 1 tablet (10 mg total) by mouth daily. 04/09/23 07/08/23  Gollan, Timothy J, MD  fluticasone  (FLONASE ) 50 MCG/ACT nasal spray Place 2 sprays into both nostrils daily. 03/17/23   Ward, Char Common, PA-C  metoprolol  succinate (TOPROL -XL) 25 MG 24 hr tablet TAKE 1 TABLET DAILY WITH ORIMMEDIATELY FOLLOWING A    MEAL 02/09/23   Donnie Galea, MD  montelukast  (SINGULAIR ) 10 MG tablet Take 1 tablet (10 mg total) by mouth at bedtime. 09/27/22   Donnie Galea, MD  naproxen  (NAPROSYN ) 500 MG tablet Take 1 tablet (500 mg total) by mouth 2 (two) times daily with a meal. 10/16/22   Lanetta Pion, NP  Semaglutide -Weight Management (WEGOVY ) 2.4 MG/0.75ML SOAJ Inject 2.4 mg into the skin once a week. 04/29/23   Donnie Galea, MD  tadalafil  (CIALIS ) 5 MG tablet Take 0.5-1 tablets (2.5-5 mg total) by mouth daily. 05/17/22   Donnie Galea, MD    Family History Family History  Problem Relation Age of Onset   Hyperlipidemia Mother    Hypertension Mother    Coronary artery disease Father    Stroke Father    Cancer Father        prostate   Heart disease Father    Prostate cancer Father    COPD Sister    Diabetes Paternal Uncle    Diabetes Paternal Grandfather    Colon cancer Neg Hx     Social History Social History   Tobacco Use   Smoking status: Former    Current packs/day: 0.00    Average packs/day: 0.5 packs/day for 4.0 years (2.0 ttl pk-yrs)    Types: Cigarettes    Start date: 37    Quit date: 1975    Years since quitting: 50.0   Smokeless tobacco: Never  Vaping Use   Vaping status: Never Used  Substance Use Topics   Alcohol use: Yes    Alcohol/week: 0.0 standard drinks of alcohol    Comment: occ glass of wine/week   Drug use: No     Allergies    Antihistamines, diphenhydramine-type; Erythromycin; Flomax  [tamsulosin ]; Guaifenesin  & derivatives; and Metoprolol    Review of Systems Review of Systems  Constitutional:  Negative for chills and fever.  HENT:  Positive for congestion, ear pain, postnasal drip, rhinorrhea and sinus pressure. Negative for sore throat.   Respiratory:  Positive for cough. Negative for shortness of breath.      Physical Exam Triage Vital Signs ED Triage Vitals  Encounter Vitals Group     BP --      Systolic BP Percentile --      Diastolic BP Percentile --      Pulse Rate 05/02/23 0835 75     Resp 05/02/23 0835 18  Temp 05/02/23 0835 97.6 F (36.4 C)     Temp src --      SpO2 --      Weight --      Height --      Head Circumference --      Peak Flow --      Pain Score 05/02/23 0836 5     Pain Loc --      Pain Education --      Exclude from Growth Chart --    No data found.  Updated Vital Signs BP 124/74   Pulse 75   Temp 97.6 F (36.4 C)   Resp 18   SpO2 95%   Visual Acuity Right Eye Distance:   Left Eye Distance:   Bilateral Distance:    Right Eye Near:   Left Eye Near:    Bilateral Near:     Physical Exam Vitals and nursing note reviewed.  Constitutional:      General: He is not in acute distress.    Appearance: He is well-developed.  HENT:     Right Ear: Tympanic membrane normal.     Left Ear: Tympanic membrane normal.     Nose: Congestion and rhinorrhea present.     Mouth/Throat:     Mouth: Mucous membranes are moist.     Pharynx: Oropharynx is clear.     Comments: PND Cardiovascular:     Rate and Rhythm: Normal rate and regular rhythm.     Heart sounds: Normal heart sounds.  Pulmonary:     Effort: Pulmonary effort is normal. No respiratory distress.     Breath sounds: Normal breath sounds.  Musculoskeletal:     Cervical back: Neck supple.  Skin:    General: Skin is warm and dry.  Neurological:     Mental Status: He is alert.      UC Treatments /  Results  Labs (all labs ordered are listed, but only abnormal results are displayed) Labs Reviewed - No data to display  EKG   Radiology No results found.  Procedures Procedures (including critical care time)  Medications Ordered in UC Medications - No data to display  Initial Impression / Assessment and Plan / UC Course  I have reviewed the triage vital signs and the nursing notes.  Pertinent labs & imaging results that were available during my care of the patient were reviewed by me and considered in my medical decision making (see chart for details).    Recurrent sinusitis.  Patient recently completed 7-day course of Augmentin .  His symptoms improved somewhat but then returned and have gotten worse.  Treating today with doxycycline  and prednisone .  Education provided on sinus infection.  Instructed patient to follow-up with his PCP or ENT.  He agrees to plan of care.  Final Clinical Impressions(s) / UC Diagnoses   Final diagnoses:  Recurrent sinusitis     Discharge Instructions      Take the doxycycline  and prednisone  as directed.  Follow up with your primary care provider or ENT.        ED Prescriptions     Medication Sig Dispense Auth. Provider   predniSONE  (STERAPRED UNI-PAK 21 TAB) 10 MG (21) TBPK tablet Take by mouth daily. As directed 21 tablet Wellington Half, NP   doxycycline  (VIBRAMYCIN ) 100 MG capsule Take 1 capsule (100 mg total) by mouth 2 (two) times daily for 7 days. 14 capsule Wellington Half, NP      PDMP not reviewed this encounter.  Wellington Half, NP 05/02/23 [phone]

## 2023-05-03 ENCOUNTER — Ambulatory Visit: Payer: Managed Care, Other (non HMO) | Admitting: Family Medicine

## 2023-05-10 ENCOUNTER — Other Ambulatory Visit: Payer: Self-pay | Admitting: Family Medicine

## 2023-05-10 NOTE — Telephone Encounter (Signed)
Last office visit: 112/2/24 Next office visit: 05/17/23 Last refill: tadalafil (CIALIS) 5 MG tablet 05/17/22 30 tablets 11 refills

## 2023-05-17 ENCOUNTER — Ambulatory Visit: Payer: Managed Care, Other (non HMO) | Admitting: Family Medicine

## 2023-05-23 ENCOUNTER — Other Ambulatory Visit: Payer: Self-pay

## 2023-05-28 ENCOUNTER — Encounter: Payer: Self-pay | Admitting: Family Medicine

## 2023-05-28 ENCOUNTER — Ambulatory Visit: Payer: Managed Care, Other (non HMO) | Admitting: Family Medicine

## 2023-05-28 VITALS — BP 138/74 | HR 64 | Temp 98.5°F | Ht 68.0 in | Wt 273.6 lb

## 2023-05-28 DIAGNOSIS — J329 Chronic sinusitis, unspecified: Secondary | ICD-10-CM | POA: Diagnosis not present

## 2023-05-28 MED ORDER — CEFDINIR 300 MG PO CAPS
300.0000 mg | ORAL_CAPSULE | Freq: Two times a day (BID) | ORAL | 0 refills | Status: DC
Start: 2023-05-28 — End: 2023-06-08

## 2023-05-28 NOTE — Telephone Encounter (Signed)
 See OV note, d/w pt at OV.

## 2023-05-28 NOTE — Patient Instructions (Addendum)
 Please ask the front for a record request from Dr. Juengel.    Let me check with Dr. Donnie Galea in the meantime.  Start omicef and we'll go from there.   Take care.  Glad to see you.

## 2023-05-28 NOTE — Progress Notes (Signed)
He is off hydrochlorothiazide. BP is reasonable and he isn't lightheaded.    Rx'd amoxil- then prednisone/doxy.  Then saw ENT.  Tx'd with quinolone and prednisone.  Done with both as ot today.  He would improve and then get worse again when off abx/prednisone.  Using nasal rinse.  Prev abx use d/w pt, over the last months.    HA but not frontal pain.  Some tooth pain, B upper teeth.  Fatigued.  No fevers.  Concentration is affected.   He had nasal scope done by Dr. Elenore Rota recently, requesting records.  He is going to need to transfer to another ENT as Dr. Elenore Rota is reportedly retiring (per patient report).  Meds, vitals, and allergies reviewed.   ROS: Per HPI unless specifically indicated in ROS section   Nad Ncat TM wnl B Nasal injection/puffiness B OP with posterior irritation.  Max > frontal sinuses ttp B Neck supple no LA Rrr Ctab Skin well perfused.

## 2023-05-30 ENCOUNTER — Telehealth: Payer: Self-pay | Admitting: Family Medicine

## 2023-05-30 ENCOUNTER — Other Ambulatory Visit: Payer: Self-pay

## 2023-05-30 DIAGNOSIS — J329 Chronic sinusitis, unspecified: Secondary | ICD-10-CM

## 2023-05-30 NOTE — Assessment & Plan Note (Signed)
Requesting records from Dr. Elenore Rota.    I will check with Dr. Andee Poles in the meantime (at Dr[facility] clinic).  Start omicef in the meantime.    Discussed possible imaging, nasal abx, eval for possible surgery per ENT.

## 2023-05-30 NOTE — Telephone Encounter (Signed)
Please call pt.  I talked with ENT clinic.  Dr. Elenore Rota can still see patient until he retires.  This isn't imminent.  He can transfer to Dr. Andee Poles at that point.  I would get sinus CT done- ordered- and then ENT should call him about follow up.  Would continue as planned with the abx in the meantime.  Thanks.

## 2023-05-31 ENCOUNTER — Encounter: Payer: Self-pay | Admitting: Pharmacist

## 2023-05-31 ENCOUNTER — Other Ambulatory Visit (HOSPITAL_COMMUNITY): Payer: Self-pay

## 2023-05-31 ENCOUNTER — Encounter: Payer: Self-pay | Admitting: Family Medicine

## 2023-05-31 NOTE — Telephone Encounter (Signed)
Patient has been notified and has been scheduled for the CT and a follow up appointment with ENT

## 2023-05-31 NOTE — Progress Notes (Signed)
WEGOVY 2.4 mg injection  Prior authorization Expired  Rx PAYOR: [id number] ([employer]) Member ID: [id number] BIN: [id number]  DOB: February 06, 1956  Group ID: [id number] PCN: [id number]  Legal sex: M  Group name: [id number]        PA Renewal for continuation of therapy - 2025: CMM Key: [id number]  [place] Start date 10/13/23 Initial Weight: 292 lb Most Recent Weight: 273 lb Percent weight loss:  -6.5% (sustained over the past 5 months)   10/13/2022 01/11/2023 04/02/2023 04/06/2023 04/09/2023  Vitals with BMI       Height 5\' 9"   5\' 9"   5\' 8"   5\' 8"   5\' 8"    Weight 292 lbs 2 oz  272 lbs  265 lbs  270 lbs 10 oz  276 lbs 4 oz   BMI 43.12  40.15  40.3  41.15  42.01     05/02/2023 05/28/2023  Vitals with BMI    Height  5\' 8"    Weight  273 lbs 10 oz   BMI  41.61     PA criteria met:  Has the patient lost at least 5 percent of baseline body weight OR has the patient continued to maintain their initial 5 percent weight loss? YES

## 2023-06-01 ENCOUNTER — Encounter: Payer: Self-pay | Admitting: Pharmacist

## 2023-06-01 NOTE — Progress Notes (Signed)
Message from plan:   Your PA request has been APPROVED. Nathanial Rancher (Key: [id number]) Reginal Lutes 2.4MG /0.75ML auto-injectors Authorization Expiration Date: May 30, 2024.

## 2023-06-04 ENCOUNTER — Ambulatory Visit
Admission: RE | Admit: 2023-06-04 | Discharge: 2023-06-04 | Disposition: A | Discharge status: Home / Self Care | Payer: Managed Care, Other (non HMO) | Source: Ambulatory Visit | Attending: Family Medicine | Admitting: Family Medicine

## 2023-06-04 DIAGNOSIS — J329 Chronic sinusitis, unspecified: Secondary | ICD-10-CM

## 2023-06-07 ENCOUNTER — Encounter (INDEPENDENT_AMBULATORY_CARE_PROVIDER_SITE_OTHER): Payer: Self-pay

## 2023-06-07 ENCOUNTER — Encounter: Payer: Self-pay | Admitting: Family Medicine

## 2023-06-08 ENCOUNTER — Other Ambulatory Visit: Payer: Self-pay | Admitting: Family Medicine

## 2023-06-08 MED ORDER — CEFDINIR 300 MG PO CAPS: 300.0000 mg | ORAL_CAPSULE | Freq: Two times a day (BID) | ORAL | 0 refills | Status: DC

## 2023-06-11 ENCOUNTER — Telehealth: Payer: Self-pay | Admitting: Family Medicine

## 2023-06-11 NOTE — Telephone Encounter (Signed)
 Copied from CRM [id number]. Topic: Clinical - Medication Question >> Jun 07, 2023  9:46 AM Sonny Dandy B wrote: Reason for CRM: pt called to advise the provider he is having sinus problems, wanted to know what he could take. Please call pt back at [phone]

## 2023-06-11 NOTE — Telephone Encounter (Signed)
 Did he see ENT today?  Please let me know.  Thanks.

## 2023-06-12 NOTE — Telephone Encounter (Signed)
See mychart message.  Thanks.  °

## 2023-06-12 NOTE — Telephone Encounter (Signed)
 See mychart message routed to you. He did see ENT but they did not give him anything for sinus.

## 2023-06-20 ENCOUNTER — Encounter: Payer: Self-pay | Admitting: Family Medicine

## 2023-06-25 ENCOUNTER — Other Ambulatory Visit (HOSPITAL_COMMUNITY): Payer: Self-pay

## 2023-07-23 ENCOUNTER — Other Ambulatory Visit (HOSPITAL_COMMUNITY): Payer: Self-pay

## 2023-07-23 ENCOUNTER — Other Ambulatory Visit: Payer: Self-pay

## 2023-07-24 ENCOUNTER — Other Ambulatory Visit: Payer: Self-pay

## 2023-07-24 ENCOUNTER — Telehealth: Admitting: Physician Assistant

## 2023-07-24 ENCOUNTER — Other Ambulatory Visit (HOSPITAL_COMMUNITY): Payer: Self-pay

## 2023-07-24 DIAGNOSIS — B9689 Other specified bacterial agents as the cause of diseases classified elsewhere: Secondary | ICD-10-CM

## 2023-07-24 DIAGNOSIS — J019 Acute sinusitis, unspecified: Secondary | ICD-10-CM

## 2023-07-24 MED ORDER — DOXYCYCLINE HYCLATE 100 MG PO TABS: 100.0000 mg | ORAL_TABLET | Freq: Two times a day (BID) | ORAL | 0 refills | Status: DC

## 2023-07-24 NOTE — Progress Notes (Signed)
 I have spent 5 minutes in review of e-visit questionnaire, review and updating patient chart, medical decision making and response to patient.   Piedad Climes, PA-C

## 2023-07-24 NOTE — Progress Notes (Signed)

## 2023-07-31 ENCOUNTER — Other Ambulatory Visit: Payer: Self-pay | Admitting: Family Medicine

## 2023-08-10 ENCOUNTER — Ambulatory Visit: Admitting: Family Medicine

## 2023-08-13 ENCOUNTER — Encounter: Payer: Self-pay | Admitting: Family Medicine

## 2023-08-16 ENCOUNTER — Other Ambulatory Visit: Payer: Self-pay

## 2023-08-16 ENCOUNTER — Other Ambulatory Visit: Payer: Self-pay | Admitting: Family Medicine

## 2023-08-16 MED ORDER — HYDROCHLOROTHIAZIDE 12.5 MG PO TABS: 12.5000 mg | ORAL_TABLET | Freq: Every day | ORAL | Status: DC

## 2023-08-16 MED ORDER — HYDROCHLOROTHIAZIDE 12.5 MG PO TABS: 12.5000 mg | ORAL_TABLET | Freq: Every day | ORAL | 0 refills | Status: DC

## 2023-08-16 NOTE — Telephone Encounter (Signed)
I sent the refill.   

## 2023-08-17 ENCOUNTER — Ambulatory Visit: Admitting: Family Medicine

## 2023-08-19 ENCOUNTER — Other Ambulatory Visit: Payer: Self-pay | Admitting: Family Medicine

## 2023-08-19 MED ORDER — HYDROCHLOROTHIAZIDE 12.5 MG PO TABS: 12.5000 mg | ORAL_TABLET | Freq: Every day | ORAL | 3 refills | Status: DC

## 2023-08-24 ENCOUNTER — Other Ambulatory Visit: Payer: Self-pay | Admitting: Family Medicine

## 2023-08-24 ENCOUNTER — Other Ambulatory Visit: Payer: Self-pay

## 2023-08-24 MED ORDER — HYDROCHLOROTHIAZIDE 12.5 MG PO TABS: 12.5000 mg | ORAL_TABLET | Freq: Every day | ORAL | 3 refills | Status: DC

## 2023-08-27 ENCOUNTER — Other Ambulatory Visit (HOSPITAL_COMMUNITY): Payer: Self-pay

## 2023-08-27 MED ORDER — WEGOVY 2.4 MG/0.75ML ~~LOC~~ SOAJ
2.4000 mg | SUBCUTANEOUS | 3 refills | Status: DC
  Filled 2023-08-27: qty 3, 28d supply, fill #0
  Filled 2023-09-19: qty 3, 28d supply, fill #1
  Filled 2023-10-17: qty 3, 28d supply, fill #2
  Filled 2023-11-14 – 2023-11-19 (×2): qty 3, 28d supply, fill #3

## 2023-09-12 ENCOUNTER — Telehealth: Admitting: Family Medicine

## 2023-09-12 ENCOUNTER — Ambulatory Visit
Admission: EM | Admit: 2023-09-12 | Discharge: 2023-09-12 | Disposition: A | Discharge status: Home / Self Care | Attending: Emergency Medicine | Admitting: Emergency Medicine

## 2023-09-12 DIAGNOSIS — J329 Chronic sinusitis, unspecified: Secondary | ICD-10-CM

## 2023-09-12 DIAGNOSIS — J0101 Acute recurrent maxillary sinusitis: Secondary | ICD-10-CM | POA: Diagnosis not present

## 2023-09-12 MED ORDER — PREDNISONE 10 MG (21) PO TBPK: ORAL_TABLET | Freq: Every day | ORAL | 0 refills | Status: DC

## 2023-09-12 MED ORDER — AMOXICILLIN-POT CLAVULANATE 875-125 MG PO TABS: 1.0000 | ORAL_TABLET | Freq: Two times a day (BID) | ORAL | 0 refills | Status: DC

## 2023-09-12 NOTE — ED Triage Notes (Signed)
 Jaw/tooth pain, sinus pain and pressure, headache, fatigue, diarrhea x 1 week. Taking tylenol  and ibuprofen.

## 2023-09-12 NOTE — Progress Notes (Signed)
  Because recent sinus infection with treatment last month your condition warrants further evaluation and we recommend that you be seen in a face-to-face visit.   NOTE: There will be NO CHARGE for this E-Visit   If you are having a true medical emergency, please call 911.

## 2023-09-12 NOTE — ED Provider Notes (Signed)
 Dustin [name]    CSN: [id number] Arrival date & time: 09/12/23  1641      History   Chief Complaint Chief Complaint  Patient presents with   Jaw Pain   Facial Pain    HPI Dustin [name] is a 68 y.o. male.   Patient presents for evaluation of nasal congestion, sinus pressure affecting the jaw and the teeth, bilateral ear pain and a nonproductive cough present for 7 days.  Associated increased fatigue.  Decreased appetite but able to tolerate food and liquids.  Has attempted use of ibuprofen and Tylenol .  History of reoccurring sinusitis.   Past Medical History:  Diagnosis Date   Allergy    Hyperlipidemia    Hypertension    Sleep apnea    Vertigo 06/03/2015   1 episode   Wears hearing aid in both ears     Patient Active Problem List   Diagnosis Date Noted   CAD (coronary artery disease) 09/24/2022   Mucous retention cyst of maxillary sinus 09/04/2022   Recurrent sinus infections 04/05/2022   OSA (obstructive sleep apnea) 11/22/2021   Erectile dysfunction 04/13/2021   Elbow pain 04/13/2021   Healthcare maintenance 03/31/2020   Nocturia 10/21/2019   Paresthesia 09/24/2019   Snoring 02/20/2018   Advance care planning 02/08/2014   Family history of prostate cancer 11/18/2010   Essential hypertension 02/19/2007   Allergic rhinitis 02/19/2007    Past Surgical History:  Procedure Laterality Date   CATARACT EXTRACTION Bilateral    COLONOSCOPY WITH PROPOFOL      COLONOSCOPY WITH PROPOFOL  N/A 05/26/2022   Procedure: COLONOSCOPY WITH PROPOFOL ;  Surgeon: Shane Darling, MD;  Location: [facility];  Service: Endoscopy;  Laterality: N/A;   ETHMOIDECTOMY Bilateral 02/27/2019   Procedure: ETHMOIDECTOMY;  Surgeon: Mellody Sprout, MD;  Location: [facility];  Service: ENT;  Laterality: Bilateral;   FRONTAL SINUS EXPLORATION Bilateral 02/27/2019   Procedure: FRONTAL SINUS EXPLORATION;  Surgeon: Mellody Sprout, MD;  Location: [facility];  Service:  ENT;  Laterality: Bilateral;   IMAGE GUIDED SINUS SURGERY Bilateral 02/27/2019   Procedure: IMAGE GUIDED SINUS SURGERY;  Surgeon: Mellody Sprout, MD;  Location: [facility];  Service: ENT;  Laterality: Bilateral;   LASIK Bilateral 2005   Dr Kirkland Peppers   MAXILLARY ANTROSTOMY Bilateral 02/27/2019   Procedure: MAXILLARY ANTROSTOMY WITH TISSUE REMOVAL;  Surgeon: Mellody Sprout, MD;  Location: [facility];  Service: ENT;  Laterality: Bilateral;   SEPTOPLASTY Bilateral 02/27/2019   Procedure: SEPTOPLASTY;  Surgeon: Mellody Sprout, MD;  Location: [facility];  Service: ENT;  Laterality: Bilateral;  disk put on desk in charge nurse office, 02/06/2019 ds put 2nd disk on charge nurse desk 10-29 kp   TURBINATE REDUCTION Bilateral 02/27/2019   Procedure: TURBINATE REDUCTION;  Surgeon: Mellody Sprout, MD;  Location: [facility];  Service: ENT;  Laterality: Bilateral;   TYMPANOSTOMY TUBE PLACEMENT     VASECTOMY  04/1995       Home Medications    Prior to Admission medications   Medication Sig Start Date End Date Taking? Authorizing Provider  amoxicillin -clavulanate (AUGMENTIN ) 875-125 MG tablet Take 1 tablet by mouth every 12 (twelve) hours. 09/12/23  Yes Sheyann Sulton, Maybelle Spatz, NP  aspirin  EC 81 MG tablet Take 1 tablet (81 mg total) by mouth daily. Swallow whole. 09/24/22  Yes Donnie Galea, MD  atorvastatin  (LIPITOR) 20 MG tablet Take 1 tablet (20 mg total) by mouth daily. 11/16/22  Yes Donnie Galea, MD  azelastine  (ASTELIN ) 0.1 %  nasal spray Place 1 spray into both nostrils 2 (two) times daily. Use in each nostril as directed 03/17/22  Yes Burnette, Leory Rands, PA-C  cetirizine (ZYRTEC) 10 MG tablet Take 10 mg by mouth daily.   Yes [provider]  ezetimibe  (ZETIA ) 10 MG tablet Take 1 tablet (10 mg total) by mouth daily. 04/09/23 09/12/23 Yes Gollan, Timothy J, MD  hydrochlorothiazide  (HYDRODIURIL ) 12.5 MG tablet Take 1 tablet (12.5 mg total) by mouth daily. 08/24/23   Yes Donnie Galea, MD  metoprolol  succinate (TOPROL -XL) 25 MG 24 hr tablet TAKE 1 TABLET DAILY WITH ORIMMEDIATELY FOLLOWING A    MEAL 07/31/23  Yes Donnie Galea, MD  montelukast  (SINGULAIR ) 10 MG tablet Take 1 tablet (10 mg total) by mouth at bedtime. 09/27/22  Yes Donnie Galea, MD  predniSONE  (STERAPRED UNI-PAK 21 TAB) 10 MG (21) TBPK tablet Take by mouth daily. Take 6 tabs by mouth daily  for 1 days, then 5 tabs for 1 days, then 4 tabs for 1 days, then 3 tabs for 1 days, 2 tabs for 1 days, then 1 tab by mouth daily for 1 days 09/12/23  Yes Jashayla Glatfelter R, NP  Semaglutide -Weight Management (WEGOVY ) 2.4 MG/0.75ML SOAJ Inject 2.4 mg into the skin once a week. 08/27/23  Yes Donnie Galea, MD  tadalafil  (CIALIS ) 5 MG tablet TAKE 1/2 TO 1 TABLET BY MOUTH DAILY 05/11/23  Yes Donnie Galea, MD  doxycycline  (VIBRA -TABS) 100 MG tablet Take 1 tablet (100 mg total) by mouth 2 (two) times daily. 07/24/23   Farris Hong, PA-C  fluticasone  (FLONASE ) 50 MCG/ACT nasal spray Place 2 sprays into both nostrils daily. 03/17/23   Ward, Char Common, PA-C  naproxen  (NAPROSYN ) 500 MG tablet Take 1 tablet (500 mg total) by mouth 2 (two) times daily with a meal. 10/16/22   Lanetta Pion, NP    Family History Family History  Problem Relation Age of Onset   Hyperlipidemia Mother    Hypertension Mother    Coronary artery disease Father    Stroke Father    Cancer Father        prostate   Heart disease Father    Prostate cancer Father    COPD Sister    Diabetes Paternal Uncle    Diabetes Paternal Grandfather    Colon cancer Neg Hx     Social History Social History   Tobacco Use   Smoking status: Former    Current packs/day: 0.00    Average packs/day: 0.5 packs/day for 4.0 years (2.0 ttl pk-yrs)    Types: Cigarettes    Start date: 35    Quit date: 22    Years since quitting: 50.4   Smokeless tobacco: Never  Vaping Use   Vaping status: Never Used  Substance Use Topics   Alcohol use:  Yes    Alcohol/week: 0.0 standard drinks of alcohol    Comment: occ glass of wine/week   Drug use: No     Allergies   Antihistamines, diphenhydramine-type; Erythromycin; Flomax  [tamsulosin ]; Guaifenesin  & derivatives; and Metoprolol    Review of Systems Review of Systems   Physical Exam Triage Vital Signs ED Triage Vitals [09/12/23 1705]  Encounter Vitals Group     BP (!) 156/87     Systolic BP Percentile      Diastolic BP Percentile      Pulse Rate 87     Resp 18     Temp 98.2 F (36.8 C)     Temp  Source Oral     SpO2 95 %     Weight      Height      Head Circumference      Peak Flow      Pain Score 7     Pain Loc      Pain Education      Exclude from Growth Chart    No data found.  Updated Vital Signs BP (!) 156/87 (BP Location: Right Arm)   Pulse 87   Temp 98.2 F (36.8 C) (Oral)   Resp 18   SpO2 95%   Visual Acuity Right Eye Distance:   Left Eye Distance:   Bilateral Distance:    Right Eye Near:   Left Eye Near:    Bilateral Near:     Physical Exam Constitutional:      Appearance: Normal appearance.  HENT:     Head: Normocephalic.     Right Ear: Tympanic membrane, ear canal and external ear normal.     Left Ear: Tympanic membrane, ear canal and external ear normal.     Nose: Congestion present.     Right Sinus: Maxillary sinus tenderness present.     Left Sinus: Maxillary sinus tenderness present.     Mouth/Throat:     Pharynx: No oropharyngeal exudate or posterior oropharyngeal erythema.  Eyes:     Extraocular Movements: Extraocular movements intact.  Cardiovascular:     Rate and Rhythm: Normal rate and regular rhythm.     Pulses: Normal pulses.     Heart sounds: Normal heart sounds.  Pulmonary:     Effort: Pulmonary effort is normal.     Breath sounds: Normal breath sounds.  Musculoskeletal:     Cervical back: Normal range of motion and neck supple.  Lymphadenopathy:     Cervical: Cervical adenopathy present.  Neurological:      Mental Status: He is alert and oriented to person, place, and time. Mental status is at baseline.      UC Treatments / Results  Labs (all labs ordered are listed, but only abnormal results are displayed) Labs Reviewed - No data to display  EKG   Radiology No results found.  Procedures Procedures (including critical care time)  Medications Ordered in UC Medications - No data to display  Initial Impression / Assessment and Plan / UC Course  I have reviewed the triage vital signs and the nursing notes.  Pertinent labs & imaging results that were available during my care of the patient were reviewed by me and considered in my medical decision making (see chart for details).  Acute recurrent maxillary sinusitis  Patient is in no signs of distress nor toxic appearing.  Vital signs are stable.  Low suspicion for pneumonia, pneumothorax or bronchitis and therefore will defer imaging.  History of reoccurring sinusitis, presentation the day consistent with above, discussed, prescribed Augmentin , recent use of doxycycline  approximately 1 to 2 months ago, additionally prescribed prednisone .May use additional over-the-counter medications as needed for supportive care.  May follow-up with urgent care as needed if symptoms persist or worsen.   Final Clinical Impressions(s) / UC Diagnoses   Final diagnoses:  Acute recurrent maxillary sinusitis   Discharge Instructions      You are being treated for a sinus infection  Begin Augmentin  twice daily for 7 days to clear bacteria contributing to symptoms  Start tomorrow take prednisone  every morning as directed to reduce sinus pressure, may use Tylenol  as needed for pain, stop use of ibuprofen  For cough: honey 1/2 to 1 teaspoon (you can dilute the honey in water or another fluid).  You can also use guaifenesin  and dextromethorphan  for cough. You can use a humidifier for chest congestion and cough.  If you don't have a humidifier, you can  sit in the bathroom with the hot shower running.      For sore throat: try warm salt water gargles, cepacol lozenges, throat spray, warm tea or water with lemon/honey, popsicles or ice, or OTC cold relief medicine for throat discomfort.   For congestion: take a daily anti-histamine like Zyrtec, Claritin, and a oral decongestant, such as pseudoephedrine.  You can also use Flonase  1-2 sprays in each nostril daily.   It is important to stay hydrated: drink plenty of fluids (water, gatorade/powerade/pedialyte, juices, or teas) to keep your throat moisturized and help further relieve irritation/discomfort.   ED Prescriptions     Medication Sig Dispense Auth. Provider   amoxicillin -clavulanate (AUGMENTIN ) 875-125 MG tablet Take 1 tablet by mouth every 12 (twelve) hours. 14 tablet Cyree Chuong R, NP   predniSONE  (STERAPRED UNI-PAK 21 TAB) 10 MG (21) TBPK tablet Take by mouth daily. Take 6 tabs by mouth daily  for 1 days, then 5 tabs for 1 days, then 4 tabs for 1 days, then 3 tabs for 1 days, 2 tabs for 1 days, then 1 tab by mouth daily for 1 days 21 tablet Favio Moder, Maybelle Spatz, NP      PDMP not reviewed this encounter.   Reena Canning, NP 09/12/23 1719

## 2023-09-12 NOTE — Discharge Instructions (Signed)
 You are being treated for a sinus infection  Begin Augmentin  twice daily for 7 days to clear bacteria contributing to symptoms  Start tomorrow take prednisone  every morning as directed to reduce sinus pressure, may use Tylenol  as needed for pain, stop use of ibuprofen    For cough: honey 1/2 to 1 teaspoon (you can dilute the honey in water or another fluid).  You can also use guaifenesin  and dextromethorphan  for cough. You can use a humidifier for chest congestion and cough.  If you don't have a humidifier, you can sit in the bathroom with the hot shower running.      For sore throat: try warm salt water gargles, cepacol lozenges, throat spray, warm tea or water with lemon/honey, popsicles or ice, or OTC cold relief medicine for throat discomfort.   For congestion: take a daily anti-histamine like Zyrtec, Claritin, and a oral decongestant, such as pseudoephedrine.  You can also use Flonase  1-2 sprays in each nostril daily.   It is important to stay hydrated: drink plenty of fluids (water, gatorade/powerade/pedialyte, juices, or teas) to keep your throat moisturized and help further relieve irritation/discomfort.

## 2023-09-19 ENCOUNTER — Other Ambulatory Visit (HOSPITAL_COMMUNITY): Payer: Self-pay

## 2023-09-24 ENCOUNTER — Other Ambulatory Visit: Payer: Self-pay | Admitting: Family Medicine

## 2023-09-24 DIAGNOSIS — J0141 Acute recurrent pansinusitis: Secondary | ICD-10-CM

## 2023-10-08 ENCOUNTER — Telehealth: Admitting: Family Medicine

## 2023-10-08 DIAGNOSIS — J069 Acute upper respiratory infection, unspecified: Secondary | ICD-10-CM

## 2023-10-08 NOTE — Progress Notes (Signed)
  Because you were just treated less than a month ago for sinus infection and two months as well prior to that, you should be seen by your PCP for recurrent infection and next steps. We feel your condition warrants further evaluation and we recommend that you be seen in a face-to-face visit.   NOTE: There will be NO CHARGE for this E-Visit   If you are having a true medical emergency, please call 911.

## 2023-10-09 ENCOUNTER — Ambulatory Visit: Admitting: Family Medicine

## 2023-10-09 VITALS — BP 132/82 | HR 71 | Temp 98.4°F | Ht 68.0 in | Wt 267.4 lb

## 2023-10-09 DIAGNOSIS — J329 Chronic sinusitis, unspecified: Secondary | ICD-10-CM | POA: Diagnosis not present

## 2023-10-09 MED ORDER — AMOXICILLIN-POT CLAVULANATE 875-125 MG PO TABS: 1.0000 | ORAL_TABLET | Freq: Two times a day (BID) | ORAL | 0 refills | Status: DC

## 2023-10-09 MED ORDER — PREDNISONE 10 MG (21) PO TBPK: ORAL_TABLET | Freq: Every day | ORAL | 0 refills | Status: DC

## 2023-10-09 NOTE — Progress Notes (Unsigned)
 UC eval and rx for sinusitis 09/12/23.  Rx'd augmentin  and prednisone .  He was better until last week.    Taking tylenol  for pain, headache. It temporarily helped. Still on flonase .   He is diligent with use.    D/w pt about his recurrent sinus infections.  He has had frequent recurrent infections, worse in the last few years.  He had nasal surgery in 2020 with temporary improvement.   Prev CT d/w pt.  IMPRESSION: 1. Presumed retention cysts in the left more than right maxillary sinus with trace adjacent mucosal thickening, stable from 2023. 2. Prior endoscopic sinus surgery. No sinus outflow obstruction or acute sinusitis.  Over the last week, had tooth and jaw pain, then HA.  Then restarted nasal rinse and that helped some with nasal discharge.  No known fevers.  Fatigue.  Some B ear pain.  HA is superior, B.  Some ST.  Cough, no sputum.    Meds, vitals, and allergies reviewed.   ROS: Per HPI unless specifically indicated in ROS section   GEN: nad, alert and oriented HEENT: mucous membranes moist, TM w/o erythema. Nasal exam stuffy. Max sinuses not ttp but mildly ttp B frontal sinuses.  NECK: supple w/o LA CV: rrr.  PULM: ctab, no inc wob ABD: soft, +bs EXT:no edema.  Skin well perfused.

## 2023-10-09 NOTE — Patient Instructions (Signed)
 Restart steroid pack with food.  Stop aleve  while on med.  Start augmentin .  Update me as needed.  Refer to ID clinic.  Take care.  Glad to see you.

## 2023-10-10 NOTE — Assessment & Plan Note (Signed)
 Discussed acute treatment and long-term options.  Restart steroid pack with food.  Steroid cautions discussed. Stop aleve  while on steroids.  Start augmentin .  Update me as needed.  Refer to ID clinic.  My questions are if the patient has any separate underlying issue that is predisposing him to recurrent upper respiratory infections and the management thereof.  He was thought not to have an anatomic issue that would need surgery at this point.

## 2023-10-17 ENCOUNTER — Other Ambulatory Visit (HOSPITAL_COMMUNITY): Payer: Self-pay

## 2023-10-17 ENCOUNTER — Other Ambulatory Visit: Payer: Self-pay

## 2023-10-25 ENCOUNTER — Encounter: Payer: Self-pay | Admitting: Family Medicine

## 2023-10-25 ENCOUNTER — Ambulatory Visit: Admitting: Family Medicine

## 2023-10-25 VITALS — BP 130/84 | HR 66 | Temp 98.2°F | Ht 68.0 in | Wt 269.4 lb

## 2023-10-25 DIAGNOSIS — J3489 Other specified disorders of nose and nasal sinuses: Secondary | ICD-10-CM | POA: Diagnosis not present

## 2023-10-25 MED ORDER — PREDNISONE 20 MG PO TABS
ORAL_TABLET | ORAL | 0 refills | Status: DC
Start: 2023-10-25 — End: 2023-11-06

## 2023-10-25 NOTE — Progress Notes (Signed)
 Patient ID: Dustin [name], male    DOB: 1955-10-14, 68 y.o.   MRN: [id number]  This visit was conducted in person.  BP 130/84   Pulse 66   Temp 98.2 F (36.8 C) (Temporal)   Ht 5' 8 (1.727 m)   Wt 269 lb 6 oz (122.2 kg)   SpO2 95%   BMI 40.96 kg/m    CC:  Chief Complaint  Patient presents with   Nasal Congestion   Headache   Facial Pain         Subjective:   HPI: Dustin [name] is a 68 y.o. male presenting on 10/25/2023 for  PCP: Cleatus  05/2023 Levaquin  per ENT for sinusitis 07/2023 doxy for sinusitis  Reviewed OV 09/12/2023 Augmentin  and prednisone  for similar symptoms  Reviewed OV from 6/24/ 2025 retreated  second course of Augmentin  and steroid  Has appt with ID 11/06/2023.SABRA given > 10  sinus infections in last year. Has CT sinuses and seen ENT. Dr. Deneise... no clear reason sinus infections  occurring.  Using nasal  steroid and saline CT 06/04/2023 IMPRESSION: 1. Presumed retention cysts in the left more than right maxillary sinus with trace adjacent mucosal thickening, stable from 2023. 2. Prior endoscopic sinus surgery. No sinus outflow obstruction or acute sinusitis.    Felt better with minimal symptoms until last few days.  Completed last antibiotic 10 days ago. Date of onset:   3-4 days Initial symptoms included  headache.. he states he has no nasal discharge but feels he is likely irrigating that out with saline Symptoms progressed to facial pain and pressure on right Bilateral jaw pain  No mucus discharge. No fever.  No ST, right ear pain mild   No SOB, no wheeze.   Sick contacts: none COVID testing:   none    He has tried to treat with  tylenol      No history of chronic lung disease such as asthma or COPD. Has history of recurrent sinus infections. See above. Non-smoker.      Relevant past medical, surgical, family and social history reviewed and updated as indicated. Interim medical history since our last visit reviewed. Allergies and  medications reviewed and updated. Outpatient Medications Prior to Visit  Medication Sig Dispense Refill   aspirin  EC 81 MG tablet Take 1 tablet (81 mg total) by mouth daily. Swallow whole.     atorvastatin  (LIPITOR) 20 MG tablet Take 1 tablet (20 mg total) by mouth daily. 90 tablet 3   azelastine  (ASTELIN ) 0.1 % nasal spray Place 1 spray into both nostrils 2 (two) times daily. Use in each nostril as directed 30 mL 0   cetirizine (ZYRTEC) 10 MG tablet Take 10 mg by mouth daily.     ezetimibe  (ZETIA ) 10 MG tablet Take 1 tablet (10 mg total) by mouth daily. 90 tablet 3   hydrochlorothiazide  (HYDRODIURIL ) 12.5 MG tablet Take 1 tablet (12.5 mg total) by mouth daily. 90 tablet 3   metoprolol  succinate (TOPROL -XL) 25 MG 24 hr tablet TAKE 1 TABLET DAILY WITH ORIMMEDIATELY FOLLOWING A    MEAL 90 tablet 1   montelukast  (SINGULAIR ) 10 MG tablet TAKE 1 TABLET AT BEDTIME 90 tablet 3   naproxen  (NAPROSYN ) 500 MG tablet Take 1 tablet (500 mg total) by mouth 2 (two) times daily with a meal. 30 tablet 0   Semaglutide -Weight Management (WEGOVY ) 2.4 MG/0.75ML SOAJ Inject 2.4 mg into the skin once a week. 3 mL 3   tadalafil  (CIALIS ) 5 MG tablet  TAKE 1/2 TO 1 TABLET BY MOUTH DAILY 30 tablet 11   XHANCE  93 MCG/ACT EXHU Place 1 spray into both nostrils 2 (two) times daily.     amoxicillin -clavulanate (AUGMENTIN ) 875-125 MG tablet Take 1 tablet by mouth every 12 (twelve) hours. 14 tablet 0   fluticasone  (FLONASE ) 50 MCG/ACT nasal spray Place 2 sprays into both nostrils daily. 16 g 6   predniSONE  (STERAPRED UNI-PAK 21 TAB) 10 MG (21) TBPK tablet Take by mouth daily. Take 6 tabs by mouth daily  for 1 day, then 5 tabs for 1 day, then 4 tabs for 1 day, then 3 tabs for 1 day, 2 tabs for 1 day, then 1 tab by mouth daily for 1 day.  With food.  Don't take with aleve  or ibuprofen. 21 tablet 0   No facility-administered medications prior to visit.     Per HPI unless specifically indicated in ROS section below Review of  Systems  Constitutional:  Negative for fatigue and fever.  HENT:  Positive for ear pain, sinus pressure and sinus pain. Negative for congestion.   Eyes:  Negative for pain.  Respiratory:  Negative for cough and shortness of breath.   Cardiovascular:  Negative for chest pain, palpitations and leg swelling.  Gastrointestinal:  Negative for abdominal pain.  Genitourinary:  Negative for dysuria.  Musculoskeletal:  Negative for arthralgias.  Neurological:  Negative for syncope, light-headedness and headaches.  Psychiatric/Behavioral:  Negative for dysphoric mood.    Objective:  BP 130/84   Pulse 66   Temp 98.2 F (36.8 C) (Temporal)   Ht 5' 8 (1.727 m)   Wt 269 lb 6 oz (122.2 kg)   SpO2 95%   BMI 40.96 kg/m   Wt Readings from Last 3 Encounters:  10/25/23 269 lb 6 oz (122.2 kg)  10/09/23 267 lb 6.4 oz (121.3 kg)  05/28/23 273 lb 9.6 oz (124.1 kg)      Physical Exam    Results for orders placed or performed in visit on 03/30/23  PSA, Medicare   Collection Time: 03/30/23  8:19 AM  Result Value Ref Range   PSA 0.52 0.10 - 4.00 ng/ml  Basic metabolic panel   Collection Time: 03/30/23  8:19 AM  Result Value Ref Range   Sodium 140 135 - 145 mEq/L   Potassium 4.1 3.5 - 5.1 mEq/L   Chloride 100 96 - 112 mEq/L   CO2 30 19 - 32 mEq/L   Glucose, Bld 92 70 - 99 mg/dL   BUN 18 6 - 23 mg/dL   Creatinine, Ser 9.08 0.40 - 1.50 mg/dL   GFR 13.76 >39.99 mL/min   Calcium  9.2 8.4 - 10.5 mg/dL    Assessment and Plan  Sinus pressure Assessment & Plan:  Acute, recurrent At this point with clear CT of sinuses 5 months ago and no current fever or purulent nasal discharge, I am questioning whether he is truly having bacterial infections.?  I wonder if an alternate diagnosis is at play such as trigeminal neuralgia or other neurologic pain syndrome, versus TMJ.  We will hold off on antibiotics given 4 separate courses of antibiotics including Levaquin , doxycycline  and 2 courses of Augmentin  in  the last 4 months.  Will try treatment with low-dose prednisone  taper over the next 15 days.  He will contact me if he has fever or purulent nasal discharge or if symptoms are worsening. I will forward this note to Dr. Cleatus for his input since he knows this patient better.  He  does also have an appointment with ID in the next 2 weeks.   Other orders -     predniSONE ; 2 tabs by mouth daily x 5 days, then 1 tabs by mouth daily x 5 days then 1/2 tab by mouth daily x  4 days  Dispense: 18 tablet; Refill: 0    No follow-ups on file.   Greig Ring, MD

## 2023-10-25 NOTE — Assessment & Plan Note (Signed)
 Acute, recurrent At this point with clear CT of sinuses 5 months ago and no current fever or purulent nasal discharge, I am questioning whether he is truly having bacterial infections.?  I wonder if an alternate diagnosis is at play such as trigeminal neuralgia or other neurologic pain syndrome, versus TMJ.  We will hold off on antibiotics given 4 separate courses of antibiotics including Levaquin , doxycycline  and 2 courses of Augmentin  in the last 4 months.  Will try treatment with low-dose prednisone  taper over the next 15 days.  He will contact me if he has fever or purulent nasal discharge or if symptoms are worsening. I will forward this note to Dr. Cleatus for his input since he knows this patient better.  He does also have an appointment with ID in the next 2 weeks.

## 2023-10-29 ENCOUNTER — Telehealth: Payer: Self-pay | Admitting: Family Medicine

## 2023-10-29 ENCOUNTER — Telehealth: Payer: Self-pay

## 2023-10-29 ENCOUNTER — Other Ambulatory Visit: Payer: Self-pay | Admitting: Family Medicine

## 2023-10-29 DIAGNOSIS — B9689 Other specified bacterial agents as the cause of diseases classified elsewhere: Secondary | ICD-10-CM

## 2023-10-29 MED ORDER — DOXYCYCLINE HYCLATE 100 MG PO TABS: 100.0000 mg | ORAL_TABLET | Freq: Two times a day (BID) | ORAL | 0 refills | Status: DC

## 2023-10-29 NOTE — Progress Notes (Signed)
 I would start doxy and I sent the rx.  Thanks. Receipt confirmed by pharmacy (10/29/2023  5:04 PM EDT).  I would still follow up with ID clinic as planned.  Thanks.

## 2023-10-29 NOTE — Progress Notes (Signed)
 Patient notified

## 2023-10-29 NOTE — Telephone Encounter (Unsigned)
 Copied from CRM [id number]. Topic: Clinical - Medical Advice >> Oct 29, 2023  4:38 PM Donee H wrote: Reason for CRM: Patient is calling stating had an appointment with Dr. Avelina last week and was told to reach out if not feeling better. Patient is stating not feeling better and would like to know if there is any different medication he can take. He stated he was not given any antibotics but now think he is ready to take some. Patient requesting a callback to follow up with instructions on what to do next. Callback number [phone]

## 2023-10-29 NOTE — Telephone Encounter (Signed)
 Copied from CRM [id number]. Topic: Clinical - Medical Advice >> Oct 29, 2023  9:44 AM Delon DASEN wrote: Reason for CRM: Not feeling any better, was told to call back if no change- [phone]

## 2023-10-30 ENCOUNTER — Ambulatory Visit: Payer: Self-pay

## 2023-10-30 NOTE — Telephone Encounter (Signed)
 Noted. Thanks.

## 2023-10-30 NOTE — Telephone Encounter (Signed)
 Closing encounter. Patient was contacted yesterday and sent a rx. Per notes patient was just calling to advise that medication was picked up

## 2023-10-30 NOTE — Telephone Encounter (Signed)
 FYI Only or Action Required?: FYI only for provider.  Patient was last seen in primary care on 10/25/2023 by Avelina Greig BRAVO, MD.  Called Nurse Triage reporting No chief complaint on file..  Symptoms began today.  Interventions attempted: Nothing.  Symptoms are: unchanged.  Triage Disposition: No disposition on file.  Patient/caregiver understands and will follow disposition?:  States he was able to pick up his antibiotic yesterday from the pharmacy.  No new needs at this time.   Copied from CRM [id number]. Topic: Clinical - Medical Advice >> Oct 29, 2023  4:38 PM Donee H wrote: Reason for CRM: Patient is calling stating had an appointment with Dr. Avelina last week and was told to reach out if not feeling better. Patient is stating not feeling better and would like to know if there is any different medication he can take. He stated he was not given any antibotics but now think he is ready to take some. Patient requesting a callback to follow up with instructions on what to do next. Callback number [phone] Reason for Disposition  Health information question, no triage required and triager able to answer question  Answer Assessment - Initial Assessment Questions 1. REASON FOR CALL: What is the main reason for your call? or How can I best help you?     Called back to tell nurse that he was able to pick up his antibiotic yesterday from pharmacy.  No new needs.  Protocols used: Information Only Call - No Triage-A-AH

## 2023-10-30 NOTE — Telephone Encounter (Signed)
 This RN attempted to contact patient to return triage call. Patient did not answer. LVM. Per orders yesterday - patient was prescribed antibiotics by Dr. Cleatus. No further needs at this time.

## 2023-10-30 NOTE — Telephone Encounter (Signed)
 Please see nurse triage encounter

## 2023-11-04 ENCOUNTER — Ambulatory Visit
Admission: EM | Admit: 2023-11-04 | Discharge: 2023-11-04 | Disposition: A | Discharge status: Home / Self Care | Attending: Emergency Medicine | Admitting: Emergency Medicine

## 2023-11-04 ENCOUNTER — Encounter: Payer: Self-pay | Admitting: Emergency Medicine

## 2023-11-04 DIAGNOSIS — J069 Acute upper respiratory infection, unspecified: Secondary | ICD-10-CM

## 2023-11-04 MED ORDER — BENZONATATE 100 MG PO CAPS: 100.0000 mg | ORAL_CAPSULE | Freq: Three times a day (TID) | ORAL | 0 refills | Status: DC

## 2023-11-04 MED ORDER — HYDROCOD POLI-CHLORPHE POLI ER 10-8 MG/5ML PO SUER: 5.0000 mL | Freq: Two times a day (BID) | ORAL | 0 refills | Status: DC | PRN

## 2023-11-04 MED ORDER — LEVOFLOXACIN 500 MG PO TABS: 500.0000 mg | ORAL_TABLET | Freq: Every day | ORAL | 0 refills | Status: DC

## 2023-11-04 NOTE — ED Provider Notes (Signed)
 Dustin [name]    CSN: [id number] Arrival date & time: 11/04/23  1150      History   Chief Complaint Chief Complaint  Patient presents with   Cough   Nasal Congestion    HPI Dustin [name] is a 68 y.o. male.   Patient presents for evaluation of nasal congestion, productive cough, frontal sinus pressure and a mild headache and bilateral ear fullness present for 10 days.  Currently taking doxycycline  and prednisone  for treatment of a sinus infection.  Endorses mild improvement with sputum changing from green back to clear.  Endorses cough has become debilitating making it difficulty to complete day-to-day activities.  Tired wheezing only at nighttime.  Decreased appetite but tolerable to some food and liquids.  Denies shortness of breath.  Has upcoming appointment with ID for reoccurring sinusitis.  Has been using Mucinex  additionally.  Past Medical History:  Diagnosis Date   Allergy    Hyperlipidemia    Hypertension    Sleep apnea    Vertigo 06/03/2015   1 episode   Wears hearing aid in both ears     Patient Active Problem List   Diagnosis Date Noted   Sinus pressure 10/25/2023   CAD (coronary artery disease) 09/24/2022   Mucous retention cyst of maxillary sinus 09/04/2022   Recurrent sinus infections 04/05/2022   OSA (obstructive sleep apnea) 11/22/2021   Erectile dysfunction 04/13/2021   Elbow pain 04/13/2021   Healthcare maintenance 03/31/2020   Nocturia 10/21/2019   Paresthesia 09/24/2019   Snoring 02/20/2018   Advance care planning 02/08/2014   Family history of prostate cancer 11/18/2010   Essential hypertension 02/19/2007   Allergic rhinitis 02/19/2007    Past Surgical History:  Procedure Laterality Date   CATARACT EXTRACTION Bilateral    COLONOSCOPY WITH PROPOFOL      COLONOSCOPY WITH PROPOFOL  N/A 05/26/2022   Procedure: COLONOSCOPY WITH PROPOFOL ;  Surgeon: Maryruth Ole DASEN, MD;  Location: [facility] END[facility];  Service: Endoscopy;  Laterality: N/A;    ETHMOIDECTOMY Bilateral 02/27/2019   Procedure: ETHMOIDECTOMY;  Surgeon: Edda Mt, MD;  Location: [facility];  Service: ENT;  Laterality: Bilateral;   FRONTAL SINUS EXPLORATION Bilateral 02/27/2019   Procedure: FRONTAL SINUS EXPLORATION;  Surgeon: Edda Mt, MD;  Location: [facility];  Service: ENT;  Laterality: Bilateral;   IMAGE GUIDED SINUS SURGERY Bilateral 02/27/2019   Procedure: IMAGE GUIDED SINUS SURGERY;  Surgeon: Edda Mt, MD;  Location: [facility];  Service: ENT;  Laterality: Bilateral;   LASIK Bilateral 2005   Dr Meridee   MAXILLARY ANTROSTOMY Bilateral 02/27/2019   Procedure: MAXILLARY ANTROSTOMY WITH TISSUE REMOVAL;  Surgeon: Edda Mt, MD;  Location: [facility];  Service: ENT;  Laterality: Bilateral;   SEPTOPLASTY Bilateral 02/27/2019   Procedure: SEPTOPLASTY;  Surgeon: Edda Mt, MD;  Location: [facility];  Service: ENT;  Laterality: Bilateral;  disk put on desk in charge nurse office, 02/06/2019 ds put 2nd disk on charge nurse desk 10-29 kp   TURBINATE REDUCTION Bilateral 02/27/2019   Procedure: TURBINATE REDUCTION;  Surgeon: Edda Mt, MD;  Location: [facility];  Service: ENT;  Laterality: Bilateral;   TYMPANOSTOMY TUBE PLACEMENT     VASECTOMY  04/1995       Home Medications    Prior to Admission medications   Medication Sig Start Date End Date Taking? Authorizing Provider  benzonatate  (TESSALON ) 100 MG capsule Take 1 capsule (100 mg total) by mouth every 8 (eight) hours. 11/04/23  Yes Teresa Shelba SAUNDERS, NP  chlorpheniramine-HYDROcodone  (TUSSIONEX) 10-8 MG/5ML Take 5 mLs by mouth every 12 (twelve) hours as needed for cough. 11/04/23  Yes Clotiel Troop R, NP  levofloxacin  (LEVAQUIN ) 500 MG tablet Take 1 tablet (500 mg total) by mouth daily. 11/04/23  Yes Normon Pettijohn, Shelba SAUNDERS, NP  aspirin  EC 81 MG tablet Take 1 tablet (81 mg total) by mouth daily. Swallow whole. 09/24/22   Cleatus Arlyss RAMAN, MD   atorvastatin  (LIPITOR) 20 MG tablet TAKE 1 TABLET DAILY 10/30/23   Cleatus Arlyss RAMAN, MD  azelastine  (ASTELIN ) 0.1 % nasal spray Place 1 spray into both nostrils 2 (two) times daily. Use in each nostril as directed 03/17/22   Vivienne Delon HERO, PA-C  cetirizine (ZYRTEC) 10 MG tablet Take 10 mg by mouth daily.    [provider]  doxycycline  (VIBRA -TABS) 100 MG tablet Take 1 tablet (100 mg total) by mouth 2 (two) times daily. 10/29/23   Cleatus Arlyss RAMAN, MD  ezetimibe  (ZETIA ) 10 MG tablet Take 1 tablet (10 mg total) by mouth daily. 04/09/23 10/25/23  Gollan, Timothy J, MD  hydrochlorothiazide  (HYDRODIURIL ) 12.5 MG tablet Take 1 tablet (12.5 mg total) by mouth daily. 08/24/23   Cleatus Arlyss RAMAN, MD  metoprolol  succinate (TOPROL -XL) 25 MG 24 hr tablet TAKE 1 TABLET DAILY WITH ORIMMEDIATELY FOLLOWING A    MEAL 07/31/23   Cleatus Arlyss RAMAN, MD  montelukast  (SINGULAIR ) 10 MG tablet TAKE 1 TABLET AT BEDTIME 09/24/23   Cleatus Arlyss RAMAN, MD  naproxen  (NAPROSYN ) 500 MG tablet Take 1 tablet (500 mg total) by mouth 2 (two) times daily with a meal. 10/16/22   Moishe Chiquita HERO, NP  predniSONE  (DELTASONE ) 20 MG tablet 2 tabs by mouth daily x 5 days, then 1 tabs by mouth daily x 5 days then 1/2 tab by mouth daily x  4 days 10/25/23   Avelina Greig BRAVO, MD  Semaglutide -Weight Management (WEGOVY ) 2.4 MG/0.75ML SOAJ Inject 2.4 mg into the skin once a week. 08/27/23   Cleatus Arlyss RAMAN, MD  tadalafil  (CIALIS ) 5 MG tablet TAKE 1/2 TO 1 TABLET BY MOUTH DAILY 05/11/23   Cleatus Arlyss RAMAN, MD  XHANCE  93 MCG/ACT EXHU Place 1 spray into both nostrils 2 (two) times daily. 10/15/23   [provider]    Family History Family History  Problem Relation Age of Onset   Hyperlipidemia Mother    Hypertension Mother    Coronary artery disease Father    Stroke Father    Cancer Father        prostate   Heart disease Father    Prostate cancer Father    COPD Sister    Diabetes Paternal Uncle    Diabetes Paternal Grandfather     Colon cancer Neg Hx     Social History Social History   Tobacco Use   Smoking status: Former    Current packs/day: 0.00    Average packs/day: 0.5 packs/day for 4.0 years (2.0 ttl pk-yrs)    Types: Cigarettes    Start date: 20    Quit date: 63    Years since quitting: 50.5   Smokeless tobacco: Never  Vaping Use   Vaping status: Never Used  Substance Use Topics   Alcohol use: Yes    Alcohol/week: 0.0 standard drinks of alcohol    Comment: occ glass of wine/week   Drug use: No     Allergies   Antihistamines, diphenhydramine-type; Erythromycin; Flomax  [tamsulosin ]; Guaifenesin  & derivatives; and Metoprolol    Review of Systems Review of Systems   Physical  Exam Triage Vital Signs ED Triage Vitals  Encounter Vitals Group     BP 11/04/23 1206 126/81     Girls Systolic BP Percentile --      Girls Diastolic BP Percentile --      Boys Systolic BP Percentile --      Boys Diastolic BP Percentile --      Pulse Rate 11/04/23 1206 71     Resp 11/04/23 1206 (!) 28     Temp 11/04/23 1206 97.9 F (36.6 C)     Temp Source 11/04/23 1206 Oral     SpO2 11/04/23 1214 92 %     Weight 11/04/23 1201 259 lb (117.5 kg)     Height --      Head Circumference --      Peak Flow --      Pain Score --      Pain Loc --      Pain Education --      Exclude from Growth Chart --    No data found.  Updated Vital Signs BP 126/81 (BP Location: Left Arm)   Pulse 71   Temp 97.9 F (36.6 C) (Oral)   Resp (!) 28   Wt 259 lb (117.5 kg)   SpO2 92%   BMI 39.38 kg/m   Visual Acuity Right Eye Distance:   Left Eye Distance:   Bilateral Distance:    Right Eye Near:   Left Eye Near:    Bilateral Near:     Physical Exam Constitutional:      Appearance: Normal appearance.  HENT:     Head: Normocephalic.     Right Ear: Tympanic membrane, ear canal and external ear normal.     Left Ear: Tympanic membrane, ear canal and external ear normal.     Nose: Congestion present.      Mouth/Throat:     Mouth: Mucous membranes are moist.     Pharynx: Oropharynx is clear. No oropharyngeal exudate.  Eyes:     Extraocular Movements: Extraocular movements intact.  Cardiovascular:     Rate and Rhythm: Normal rate and regular rhythm.     Pulses: Normal pulses.     Heart sounds: Normal heart sounds.  Pulmonary:     Effort: Pulmonary effort is normal.     Comments: Rhonchi present to the left upper and lower lobes, remaining lobes clear Musculoskeletal:     Cervical back: Normal range of motion and neck supple.  Neurological:     Mental Status: He is alert and oriented to person, place, and time. Mental status is at baseline.      UC Treatments / Results  Labs (all labs ordered are listed, but only abnormal results are displayed) Labs Reviewed - No data to display  EKG   Radiology No results found.  Procedures Procedures (including critical care time)  Medications Ordered in UC Medications - No data to display  Initial Impression / Assessment and Plan / UC Course  I have reviewed the triage vital signs and the nursing notes.  Pertinent labs & imaging results that were available during my care of the patient were reviewed by me and considered in my medical decision making (see chart for details).  Acute URI  Vital signs are stable, O2 sat exertion 92% on room air, rhonchi heard to the left upper lobes, persistent cough and wheezing but lung sounds unchanged, concerning for pneumonia, discussed this with patient, declined chest x-ray, change doxycycline  to Levaquin  and prescribed Tessalon  and Tussidex,  PDMP reviewed, low risk, recommended nonpharmacological supportive care and advised follow-up as needed, has upcoming appointment with I&D in 2 days Final Clinical Impressions(s) / UC Diagnoses   Final diagnoses:  Acute URI     Discharge Instructions      Today you were evaluated for your cough and congestion  On exam your lungs on the left side sound  congested and the sound did not improve with you coughing which is concerning for possible pneumonia  You have declined a chest x-ray  Stop use of doxycycline  and begin Levaquin  every morning for 7 days  You may use Tessalon  pill every 8 hours as needed for cough and you may use cough syrup twice daily for additional comfort, please be mindful the cough syrup can make you feel drowsy   For cough: honey 1/2 to 1 teaspoon (you can dilute the honey in water or another fluid). You can use a humidifier for chest congestion and cough.  If you don't have a humidifier, you can sit in the bathroom with the hot shower running.      For sore throat: try warm salt water gargles, cepacol lozenges, throat spray, warm tea or water with lemon/honey, popsicles or ice, or OTC cold relief medicine for throat discomfort.   For congestion: take a daily anti-histamine like Zyrtec, Claritin, and a oral decongestant, such as pseudoephedrine.  You can also use Flonase  1-2 sprays in each nostril daily.   It is important to stay hydrated: drink plenty of fluids (water, gatorade/powerade/pedialyte, juices, or teas) to keep your throat moisturized and help further relieve irritation/discomfort.    ED Prescriptions     Medication Sig Dispense Auth. Provider   levofloxacin  (LEVAQUIN ) 500 MG tablet Take 1 tablet (500 mg total) by mouth daily. 7 tablet Boneta Standre R, NP   benzonatate  (TESSALON ) 100 MG capsule Take 1 capsule (100 mg total) by mouth every 8 (eight) hours. 21 capsule Bedelia Pong R, NP   chlorpheniramine-HYDROcodone  (TUSSIONEX) 10-8 MG/5ML Take 5 mLs by mouth every 12 (twelve) hours as needed for cough. 115 mL Henryk Ursin, Shelba SAUNDERS, NP      I have reviewed the PDMP during this encounter.   Teresa Shelba SAUNDERS, NP 11/04/23 1225

## 2023-11-04 NOTE — ED Triage Notes (Signed)
 Patient in office today complaint of cough and congestion x 10d already taking anitbiotics   OTC: Guiafensin(OTC)  Denies Fever,n/v

## 2023-11-04 NOTE — Discharge Instructions (Signed)
 Today you were evaluated for your cough and congestion  On exam your lungs on the left side sound congested and the sound did not improve with you coughing which is concerning for possible pneumonia  You have declined a chest x-ray  Stop use of doxycycline  and begin Levaquin  every morning for 7 days  You may use Tessalon  pill every 8 hours as needed for cough and you may use cough syrup twice daily for additional comfort, please be mindful the cough syrup can make you feel drowsy   For cough: honey 1/2 to 1 teaspoon (you can dilute the honey in water or another fluid). You can use a humidifier for chest congestion and cough.  If you don't have a humidifier, you can sit in the bathroom with the hot shower running.      For sore throat: try warm salt water gargles, cepacol lozenges, throat spray, warm tea or water with lemon/honey, popsicles or ice, or OTC cold relief medicine for throat discomfort.   For congestion: take a daily anti-histamine like Zyrtec, Claritin, and a oral decongestant, such as pseudoephedrine.  You can also use Flonase  1-2 sprays in each nostril daily.   It is important to stay hydrated: drink plenty of fluids (water, gatorade/powerade/pedialyte, juices, or teas) to keep your throat moisturized and help further relieve irritation/discomfort.

## 2023-11-05 ENCOUNTER — Encounter: Payer: Self-pay | Admitting: Family Medicine

## 2023-11-06 ENCOUNTER — Ambulatory Visit: Attending: Infectious Diseases | Admitting: Infectious Diseases

## 2023-11-06 ENCOUNTER — Ambulatory Visit
Admission: RE | Admit: 2023-11-06 | Discharge: 2023-11-06 | Disposition: A | Discharge status: Home / Self Care | Source: Ambulatory Visit | Attending: Infectious Diseases | Admitting: Infectious Diseases

## 2023-11-06 ENCOUNTER — Encounter: Payer: Self-pay | Admitting: Infectious Diseases

## 2023-11-06 ENCOUNTER — Other Ambulatory Visit
Admission: RE | Admit: 2023-11-06 | Discharge: 2023-11-06 | Disposition: A | Discharge status: Home / Self Care | Source: Ambulatory Visit | Attending: Infectious Diseases | Admitting: Infectious Diseases

## 2023-11-06 VITALS — BP 137/83 | HR 66 | Temp 97.9°F | Ht 68.0 in | Wt 259.0 lb

## 2023-11-06 DIAGNOSIS — J988 Other specified respiratory disorders: Secondary | ICD-10-CM | POA: Diagnosis present

## 2023-11-06 DIAGNOSIS — J309 Allergic rhinitis, unspecified: Secondary | ICD-10-CM | POA: Insufficient documentation

## 2023-11-06 DIAGNOSIS — R053 Chronic cough: Secondary | ICD-10-CM | POA: Diagnosis not present

## 2023-11-06 DIAGNOSIS — J069 Acute upper respiratory infection, unspecified: Secondary | ICD-10-CM | POA: Diagnosis present

## 2023-11-06 DIAGNOSIS — J329 Chronic sinusitis, unspecified: Secondary | ICD-10-CM | POA: Diagnosis not present

## 2023-11-06 DIAGNOSIS — E785 Hyperlipidemia, unspecified: Secondary | ICD-10-CM | POA: Insufficient documentation

## 2023-11-06 DIAGNOSIS — I10 Essential (primary) hypertension: Secondary | ICD-10-CM | POA: Insufficient documentation

## 2023-11-06 DIAGNOSIS — G4733 Obstructive sleep apnea (adult) (pediatric): Secondary | ICD-10-CM | POA: Insufficient documentation

## 2023-11-06 DIAGNOSIS — Z79899 Other long term (current) drug therapy: Secondary | ICD-10-CM | POA: Insufficient documentation

## 2023-11-06 DIAGNOSIS — B9689 Other specified bacterial agents as the cause of diseases classified elsewhere: Secondary | ICD-10-CM

## 2023-11-06 LAB — CBC WITH DIFFERENTIAL/PLATELET
Abs Immature Granulocytes: 0.04 K/uL (ref 0.00–0.07)
Basophils Absolute: 0 K/uL (ref 0.0–0.1)
Basophils Relative: 0 %
Eosinophils Absolute: 0.1 K/uL (ref 0.0–0.5)
Eosinophils Relative: 1 %
HCT: 45.1 % (ref 39.0–52.0)
Hemoglobin: 14.8 g/dL (ref 13.0–17.0)
Immature Granulocytes: 0 %
Lymphocytes Relative: 22 %
Lymphs Abs: 2 K/uL (ref 0.7–4.0)
MCH: 30.6 pg (ref 26.0–34.0)
MCHC: 32.8 g/dL (ref 30.0–36.0)
MCV: 93.2 fL (ref 80.0–100.0)
Monocytes Absolute: 0.5 K/uL (ref 0.1–1.0)
Monocytes Relative: 5 %
Neutro Abs: 6.4 K/uL (ref 1.7–7.7)
Neutrophils Relative %: 72 %
Platelets: 199 K/uL (ref 150–400)
RBC: 4.84 MIL/uL (ref 4.22–5.81)
RDW: 12.9 % (ref 11.5–15.5)
WBC: 9.1 K/uL (ref 4.0–10.5)
nRBC: 0 % (ref 0.0–0.2)

## 2023-11-06 NOTE — Progress Notes (Signed)
 NAME: Dustin [name]  DOB: 1956-01-02  MRN: [id number]  Date/Time: 11/06/2023 8:17 PM   Subjective:  Pt is here with his wife- referred for recurrent upper respiratory infection/sinusitis- agreed to the use of AI scribe ? Dustin [name] is a 68 y.o. with a history of HTN, HLD chronic sinus issues who presents with persistent sinus congestion and cough. He was referred by Dr. Cleatus for evaluation of his chronic sinus issues.  He has a long-standing history of sinus problems, characterized by recurring sinus infections, sinus pressure, headaches, and green nasal discharge. He reports that his symptoms have persisted for years, and that he experiences some relief after antibiotic and steroid treatments, but the symptoms tend to return. In November 2020, he underwent nasal surgery, including nasal septoplasty and reduction of inferior turbinates and maxillary and frontal antrostomies. He did well for a few years   He has been receiving allergy shots for several years but missed his last appointment due to illness. Currently, he experiences significant sinus congestion and a persistent, productive cough, which has been particularly severe over the past two weeks. The cough is severe enough to cause tears and disrupt sleep. No fever is present, but he reports fatigue and decreased energy levels. This started after his travel to [place] and then [place]  His medical history includes high blood pressure, managed with metoprolol  and hydrochlorothiazide , and hyperlipidemia, managed with atorvastatin . He also takes montelukast  for allergies and uses Astelin  nasal spray. Recently, he completed a course of prednisone  and is currently on levofloxacin . He also uses Zyrtec, Tessalon  Perles, and hydrocodone  cough syrup as needed.  Family history reveals similar sinus issues in his mother and daughter, with his daughter recently undergoing septal surgery. His wife also has similar problems and is being  treated for a fungal infection.  Past Medical History:  Diagnosis Date   Allergy    Hyperlipidemia    Hypertension    Sleep apnea    Vertigo 06/03/2015   1 episode   Wears hearing aid in both ears     Past Surgical History:  Procedure Laterality Date   CATARACT EXTRACTION Bilateral    COLONOSCOPY WITH PROPOFOL      COLONOSCOPY WITH PROPOFOL  N/A 05/26/2022   Procedure: COLONOSCOPY WITH PROPOFOL ;  Surgeon: Maryruth Ole DASEN, MD;  Location: [facility] END[facility]COPY;  Service: Endoscopy;  Laterality: N/A;   ETHMOIDECTOMY Bilateral 02/27/2019   Procedure: ETHMOIDECTOMY;  Surgeon: Edda Mt, MD;  Location: [facility];  Service: ENT;  Laterality: Bilateral;   FRONTAL SINUS EXPLORATION Bilateral 02/27/2019   Procedure: FRONTAL SINUS EXPLORATION;  Surgeon: Edda Mt, MD;  Location: [facility];  Service: ENT;  Laterality: Bilateral;   IMAGE GUIDED SINUS SURGERY Bilateral 02/27/2019   Procedure: IMAGE GUIDED SINUS SURGERY;  Surgeon: Edda Mt, MD;  Location: [facility];  Service: ENT;  Laterality: Bilateral;   LASIK Bilateral 2005   Dr Meridee   MAXILLARY ANTROSTOMY Bilateral 02/27/2019   Procedure: MAXILLARY ANTROSTOMY WITH TISSUE REMOVAL;  Surgeon: Edda Mt, MD;  Location: [facility];  Service: ENT;  Laterality: Bilateral;   SEPTOPLASTY Bilateral 02/27/2019   Procedure: SEPTOPLASTY;  Surgeon: Edda Mt, MD;  Location: [facility];  Service: ENT;  Laterality: Bilateral;  disk put on desk in charge nurse office, 02/06/2019 ds put 2nd disk on charge nurse desk 10-29 kp   TURBINATE REDUCTION Bilateral 02/27/2019   Procedure: TURBINATE REDUCTION;  Surgeon: Edda Mt, MD;  Location: [facility];  Service: ENT;  Laterality: Bilateral;  TYMPANOSTOMY TUBE PLACEMENT     VASECTOMY  04/1995    Social History   Socioeconomic History   Marital status: Married    Spouse name: Not on file   Number of children: 4   Years of  education: Not on file   Highest education level: Master's degree (e.g., MA, MS, MEng, MEd, MSW, MBA)  Occupational History   Occupation: Reinsurance-Marketing with companies  Tobacco Use   Smoking status: Former    Current packs/day: 0.00    Average packs/day: 0.5 packs/day for 4.0 years (2.0 ttl pk-yrs)    Types: Cigarettes    Start date: 68    Quit date: 1975    Years since quitting: 50.5   Smokeless tobacco: Never  Vaping Use   Vaping status: Never Used  Substance and Sexual Activity   Alcohol use: Yes    Alcohol/week: 0.0 standard drinks of alcohol    Comment: occ glass of wine/week   Drug use: No   Sexual activity: Not on file  Other Topics Concern   Not on file  Social History Narrative   Lives with wife, married since 1987, prev widowed   From [place], [place]   4 kids combined with his 2nd wife, 6 grandkids   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   Right handed   Social Drivers of Health   Financial Resource Strain: Low Risk  (10/09/2023)   Overall Financial Resource Strain (CARDIA)    Difficulty of Paying Living Expenses: Not hard at all  Food Insecurity: No Food Insecurity (10/09/2023)   Hunger Vital Sign    Worried About Running Out of Food in the Last Year: Never true    Ran Out of Food in the Last Year: Never true  Transportation Needs: No Transportation Needs (10/09/2023)   PRAPARE - Administrator, Civil Service (Medical): No    Lack of Transportation (Non-Medical): No  Physical Activity: Insufficiently Active (10/09/2023)   Exercise Vital Sign    Days of Exercise per Week: 1 day    Minutes of Exercise per Session: 10 min  Stress: No Stress Concern Present (10/09/2023)   [employer] of Occupational Health - Occupational Stress Questionnaire    Feeling of Stress: Only a little  Social Connections: Socially Integrated (10/09/2023)   Social Connection and Isolation Panel    Frequency of Communication with Friends and Family: Once  a week    Frequency of Social Gatherings with Friends and Family: Twice a week    Attends Religious Services: 1 to 4 times per year    Active Member of [employer] or Organizations: Yes    Attends Engineer, structural: More than 4 times per year    Marital Status: Married  Catering manager Violence: Not At Risk (04/02/2023)   Humiliation, Afraid, Rape, and Kick questionnaire    Fear of Current or Ex-Partner: No    Emotionally Abused: No    Physically Abused: No    Sexually Abused: No    Family History  Problem Relation Age of Onset   Hyperlipidemia Mother    Hypertension Mother    Coronary artery disease Father    Stroke Father    Cancer Father        prostate   Heart disease Father    Prostate cancer Father    COPD Sister    Diabetes Paternal Uncle    Diabetes Paternal Grandfather    Colon cancer Neg Hx    Allergies  Allergen Reactions  Antihistamines, Diphenhydramine-Type Other (See Comments)    Insomnia with benadryl, but able to tolerate claritin   Erythromycin     REACTION: GI UPSET   Flomax  [Tamsulosin ]     headache   Guaifenesin  & Derivatives Other (See Comments)    Irritable but able to tolerate med if needed.    Metoprolol  Other (See Comments)    Max tolerated dose 50mg  day, not an allergy   I? Current Outpatient Medications  Medication Sig Dispense Refill   aspirin  EC 81 MG tablet Take 1 tablet (81 mg total) by mouth daily. Swallow whole.     atorvastatin  (LIPITOR) 20 MG tablet TAKE 1 TABLET DAILY 90 tablet 3   azelastine  (ASTELIN ) 0.1 % nasal spray Place 1 spray into both nostrils 2 (two) times daily. Use in each nostril as directed 30 mL 0   benzonatate  (TESSALON ) 100 MG capsule Take 1 capsule (100 mg total) by mouth every 8 (eight) hours. 21 capsule 0   cetirizine (ZYRTEC) 10 MG tablet Take 10 mg by mouth daily.     chlorpheniramine-HYDROcodone  (TUSSIONEX) 10-8 MG/5ML Take 5 mLs by mouth every 12 (twelve) hours as needed for cough. 115 mL 0    ezetimibe  (ZETIA ) 10 MG tablet Take 1 tablet (10 mg total) by mouth daily. 90 tablet 3   hydrochlorothiazide  (HYDRODIURIL ) 12.5 MG tablet Take 1 tablet (12.5 mg total) by mouth daily. 90 tablet 3   levofloxacin  (LEVAQUIN ) 500 MG tablet Take 1 tablet (500 mg total) by mouth daily. 7 tablet 0   metoprolol  succinate (TOPROL -XL) 25 MG 24 hr tablet TAKE 1 TABLET DAILY WITH ORIMMEDIATELY FOLLOWING A    MEAL 90 tablet 1   montelukast  (SINGULAIR ) 10 MG tablet TAKE 1 TABLET AT BEDTIME 90 tablet 3   naproxen  (NAPROSYN ) 500 MG tablet Take 1 tablet (500 mg total) by mouth 2 (two) times daily with a meal. 30 tablet 0   Semaglutide -Weight Management (WEGOVY ) 2.4 MG/0.75ML SOAJ Inject 2.4 mg into the skin once a week. 3 mL 3   tadalafil  (CIALIS ) 5 MG tablet TAKE 1/2 TO 1 TABLET BY MOUTH DAILY 30 tablet 11   XHANCE  93 MCG/ACT EXHU Place 1 spray into both nostrils 2 (two) times daily.     No current facility-administered medications for this visit.     Abtx:  Anti-infectives (From admission, onward)    None       REVIEW OF SYSTEMS:  Const: negative fever, negative chills, negative weight loss Eyes: negative diplopia or visual changes, negative eye pain ENT: as above Resp:++cough, no hemoptysis, dyspnea Cards: negative for chest pain, palpitations, lower extremity edema GU: negative for frequency, dysuria and hematuria GI: Negative for abdominal pain, diarrhea, bleeding, constipation Skin: negative for rash and pruritus Heme: negative for easy bruising and gum/nose bleeding MS: weakness and fatigue Neurolo:negative for headaches, dizziness, vertigo, memory problems  Psych: negative for feelings of anxiety, depression  Endocrine: negative for thyroid , diabetes Allergy/Immunology- as above Objective:  VITALS:  BP 137/83   Pulse 66   Temp 97.9 F (36.6 C) (Temporal)   Ht 5' 8 (1.727 m)   Wt 259 lb (117.5 kg)   SpO2 91%   BMI 39.38 kg/m   PHYSICAL EXAM:  General: Alert, cooperative, no  distress, appears stated age.  Head: Normocephalic, without obvious abnormality, atraumatic. Eyes: Conjunctivae clear, anicteric sclerae. Pupils are equal ENT Nares normal. No drainage or sinus tenderness. Lips, mucosa, and tongue normal. No Thrush Neck: Supple, symmetrical, no adenopathy, thyroid : non tender no carotid  bruit and no JVD. Back: No CVA tenderness. Lungs:b/l air entry- few crackles left base Heart: Regular rate and rhythm, no murmur, rub or gallop. Abdomen: Soft, non-tender,not distended. Bowel sounds normal. No masses Extremities: atraumatic, no cyanosis. No edema. No clubbing Skin: No rashes or lesions. Or bruising Lymph: Cervical, supraclavicular normal. Neurologic: Grossly non-focal Pertinent Labs Lab Results CBC    Component Value Date/Time   WBC 9.1 11/06/2023 1216   RBC 4.84 11/06/2023 1216   HGB 14.8 11/06/2023 1216   HCT 45.1 11/06/2023 1216   PLT 199 11/06/2023 1216   MCV 93.2 11/06/2023 1216   MCH 30.6 11/06/2023 1216   MCHC 32.8 11/06/2023 1216   RDW 12.9 11/06/2023 1216   LYMPHSABS 2.0 11/06/2023 1216   MONOABS 0.5 11/06/2023 1216   EOSABS 0.1 11/06/2023 1216   BASOSABS 0.0 11/06/2023 1216       Latest Ref Rng & Units 03/30/2023    8:19 AM 01/08/2023    9:26 AM 03/29/2022    8:16 AM  CMP  Glucose 70 - 99 mg/dL 92   99   BUN 6 - 23 mg/dL 18   23   Creatinine 9.59 - 1.50 mg/dL 9.08   9.15   Sodium 864 - 145 mEq/L 140   140   Potassium 3.5 - 5.1 mEq/L 4.1   4.3   Chloride 96 - 112 mEq/L 100   99   CO2 19 - 32 mEq/L 30   34   Calcium  8.4 - 10.5 mg/dL 9.2   9.8   Total Protein 6.0 - 8.5 g/dL  6.7  7.3   Total Bilirubin 0.0 - 1.2 mg/dL  0.4  0.3   Alkaline Phos 44 - 121 IU/L  95  64   AST 0 - 40 IU/L  14  12   ALT 0 - 44 IU/L  20  17       Microbiology: No results found for this or any previous visit (from the past 240 hours).  IMAGING RESULTS: I have personally reviewed the films ? Impression/Recommendation ?Recurrent  sinusitis    Since October 25, 2023, there has been an exacerbation with increased coughing and fatigue. The differential includes viral or bacterial infection, or reactive airway disease. A recent CT scan in February 2025 showed a retention cyst in rt maxillay sinus  and ENT evaluation by Dr. Cherie  revealed no structural issues requiring intervention. Viral etiology is considered due to recent travel and exposure. Will send respiratory viral PCR to check for viral infection.  CXR, immunoglobulin levels Communicate with Dr. Cleatus regarding sinusitis management. Look for other causes if eosinophil or IgE is high  Refer to ENT for further evaluation if necessary.  Allergic rhinitis   He has allergic rhinitis with ongoing symptoms managed with montelukast , Astelin  nasal spray, and Zyrtec. He missed a recent allergy shot appointment due to illness.  Obstructive sleep apnea   But not using cpap  Hypertension    managed with metoprolol  and hydrochlorothiazide . ? ?    ________________________________________________ Discussed with patient,and his wife in detail Will let him know of the test results and further plan  11/07/23 1.38 pm Resp viral PCR metpneumovirus CXR looks okay with no obvious infiltrate- official report not available CBC N So just supportive care  Immunoglobulins pending Left a message for the patient and also his wife on their cell phones regarding the result. Also forwarded the results to his PCP

## 2023-11-06 NOTE — Patient Instructions (Signed)
 Today, you were seen for your persistent sinus congestion and cough. You have a history of chronic sinus issues, and despite previous treatments and surgery, your symptoms have persisted. You are currently experiencing significant sinus congestion and a severe cough that has been particularly troublesome over the past two weeks. We discussed your ongoing symptoms and reviewed your current medications and treatments.  YOUR PLAN:  -CHRONIC SINUSITIS: Chronic sinusitis is a long-term inflammation of the sinuses that can cause symptoms like sinus pressure, headaches, and nasal discharge. We will perform a respiratory viral PCR to check for a viral infection and communicate with Dr. Cleatus regarding your sinusitis management. If necessary, you will be referred to Dr. Juengel for further ENT evaluation.  -ALLERGIC RHINITIS: Allergic rhinitis is an allergic reaction that causes sneezing, congestion, and a runny nose. Continue managing your symptoms with montelukast , Astelin  nasal spray, and Zyrtec. Please reschedule your missed allergy shot appointment.  -OBSTRUCTIVE SLEEP APNEA: Obstructive sleep apnea is a condition where your breathing stops and starts during sleep. We did not make any changes to your current management plan for this condition today.  -HYPERTENSION: Hypertension, or high blood pressure, is being managed with metoprolol  and hydrochlorothiazide . Your blood pressure readings have been variable, but today's reading was 137/83 mmHg. Continue monitoring your blood pressure regularly.  INSTRUCTIONS:  CXR, Labs today and will contact you and your PCP with the results and management

## 2023-11-07 LAB — RESPIRATORY PANEL BY PCR

## 2023-11-08 ENCOUNTER — Encounter: Payer: Self-pay | Admitting: Infectious Diseases

## 2023-11-08 ENCOUNTER — Ambulatory Visit: Payer: Self-pay

## 2023-11-08 LAB — IMMUNOGLOBULINS A/E/G/M, SERUM
IgA: 227 mg/dL (ref 61–437)
IgE (Immunoglobulin E), Serum: 546 [IU]/mL — ABNORMAL HIGH (ref 6–495)
IgG (Immunoglobin G), Serum: 928 mg/dL (ref 603–1613)
IgM (Immunoglobulin M), Srm: 45 mg/dL (ref 20–172)

## 2023-11-14 ENCOUNTER — Other Ambulatory Visit: Payer: Self-pay

## 2023-11-17 ENCOUNTER — Other Ambulatory Visit: Payer: Self-pay | Admitting: Cardiovascular Disease

## 2023-12-11 ENCOUNTER — Other Ambulatory Visit: Payer: Self-pay | Admitting: Family Medicine

## 2023-12-11 ENCOUNTER — Other Ambulatory Visit (HOSPITAL_COMMUNITY): Payer: Self-pay

## 2023-12-11 ENCOUNTER — Encounter (HOSPITAL_COMMUNITY): Payer: Self-pay

## 2023-12-11 ENCOUNTER — Encounter: Payer: Self-pay | Admitting: Family Medicine

## 2023-12-12 ENCOUNTER — Other Ambulatory Visit: Payer: Self-pay | Admitting: Family Medicine

## 2023-12-12 ENCOUNTER — Other Ambulatory Visit (HOSPITAL_COMMUNITY): Payer: Self-pay

## 2023-12-12 MED ORDER — OMEPRAZOLE 20 MG PO CPDR: 20.0000 mg | DELAYED_RELEASE_CAPSULE | Freq: Every day | ORAL | Status: DC

## 2023-12-12 MED ORDER — WEGOVY 2.4 MG/0.75ML ~~LOC~~ SOAJ
2.4000 mg | SUBCUTANEOUS | 3 refills | Status: DC
  Filled 2023-12-12: qty 3, 28d supply, fill #0
  Filled 2024-01-09: qty 3, 28d supply, fill #1
  Filled 2024-02-06 – 2024-02-07 (×2): qty 3, 28d supply, fill #2
  Filled 2024-03-01: qty 3, 28d supply, fill #3

## 2023-12-12 NOTE — Telephone Encounter (Signed)
 Plz associate dx code to rx.  Wegovy  Last filled:  11/19/23, #3 mL Last OV:  10/09/23, recurrent sinusitis Next OV:  none

## 2023-12-12 NOTE — Telephone Encounter (Signed)
 Sent. Thanks.

## 2023-12-13 ENCOUNTER — Other Ambulatory Visit: Payer: Self-pay

## 2024-01-09 ENCOUNTER — Other Ambulatory Visit: Payer: Self-pay

## 2024-01-16 ENCOUNTER — Other Ambulatory Visit: Payer: Self-pay | Admitting: Family Medicine

## 2024-01-22 ENCOUNTER — Encounter: Payer: Self-pay | Admitting: Family Medicine

## 2024-01-23 NOTE — Telephone Encounter (Signed)
 Please update his med list.  See MyChart message.  Thanks.

## 2024-01-24 ENCOUNTER — Other Ambulatory Visit: Payer: Self-pay

## 2024-02-06 ENCOUNTER — Other Ambulatory Visit (HOSPITAL_COMMUNITY): Payer: Self-pay

## 2024-02-06 ENCOUNTER — Other Ambulatory Visit: Payer: Self-pay

## 2024-02-07 ENCOUNTER — Encounter: Payer: Self-pay | Admitting: Pharmacist

## 2024-02-07 ENCOUNTER — Other Ambulatory Visit (HOSPITAL_COMMUNITY): Payer: Self-pay

## 2024-02-07 ENCOUNTER — Other Ambulatory Visit: Payer: Self-pay

## 2024-02-08 ENCOUNTER — Other Ambulatory Visit: Payer: Self-pay

## 2024-02-13 ENCOUNTER — Telehealth: Admitting: Physician Assistant

## 2024-02-13 DIAGNOSIS — B9689 Other specified bacterial agents as the cause of diseases classified elsewhere: Secondary | ICD-10-CM

## 2024-02-13 DIAGNOSIS — J019 Acute sinusitis, unspecified: Secondary | ICD-10-CM

## 2024-02-13 MED ORDER — AMOXICILLIN-POT CLAVULANATE 875-125 MG PO TABS: 1.0000 | ORAL_TABLET | Freq: Two times a day (BID) | ORAL | 0 refills | Status: DC

## 2024-02-13 NOTE — Progress Notes (Signed)

## 2024-02-22 ENCOUNTER — Other Ambulatory Visit: Payer: Self-pay | Admitting: Family Medicine

## 2024-02-22 ENCOUNTER — Ambulatory Visit: Payer: Self-pay

## 2024-02-22 DIAGNOSIS — B9689 Other specified bacterial agents as the cause of diseases classified elsewhere: Secondary | ICD-10-CM

## 2024-02-22 MED ORDER — DOXYCYCLINE HYCLATE 100 MG PO TABS: 100.0000 mg | ORAL_TABLET | Freq: Two times a day (BID) | ORAL | 0 refills | Status: DC

## 2024-02-22 MED ORDER — DOXYCYCLINE HYCLATE 100 MG PO TABS
100.0000 mg | ORAL_TABLET | Freq: Two times a day (BID) | ORAL | Status: DC
Start: 2024-02-22 — End: 2024-02-22

## 2024-02-22 NOTE — Telephone Encounter (Signed)
 First attempt made to contact patient for triage.  LVM for patient to return call to [phone].  Routed to callbacks for further attempts  Copied from CRM #[id number]. Topic: Clinical - Medication Question >> Feb 22, 2024  8:54 AM Avram MATSU wrote: Reason for CRM: patient is calling because he had a evisit for sinus infection, was prescribed antibiotics but its not working. Pt wanted to know if he can get medicine called in, patient will be leaving on Sunday. Please advise [phone]  [facility] - [place], [place] - [name] [place] [address] Phone: [phone] Fax: [phone]

## 2024-02-22 NOTE — Telephone Encounter (Signed)
  Second attempt made to contact patient for triage.  LVM for patient to return call to [phone].  Routed to callbacks for further attempts      Copied from CRM #[id number]. Topic: Clinical - Medication Question >> Feb 22, 2024  8:54 AM Dustin [name] wrote: Reason for CRM: patient is calling because he had a evisit for sinus infection, was prescribed antibiotics but its not working. Pt wanted to know if he can get medicine called in, patient will be leaving on Sunday. Please advise [phone]   [facility] - [place], [place] - [name] [place] [address] Phone: [phone] Fax: [phone]

## 2024-02-22 NOTE — Telephone Encounter (Signed)
 FYI Only or Action Required?: Action required by provider: new medication request.  Patient was last seen in primary care on 10/25/2023 by Dustin Greig BRAVO, MD.  Called Nurse Triage reporting No chief complaint on file..  Symptoms began several weeks ago.  Interventions attempted: Prescription medications: Amoxicillin -did not clear symptoms completely.  Symptoms are: gradually worsening.  Triage Disposition: See PCP When Office is Open (Within 3 Days)  Patient/caregiver understands and will follow disposition?: declines- Patient is going out of state this weekend- would like Rx before he goes- patient is aware of protocol- request that message be sent.   Reason for Disposition  [1] Sinus congestion (pressure, fullness) AND [2] present > 10 days  Answer Assessment - Initial Assessment Questions 1. LOCATION: Where does it hurt?      nasal congestion, sinus pressure- eyes 2. ONSET: When did the sinus pain start?  (e.g., hours, days)   VV 10/29- treated with    Amoxicillin -Pot Clavulanate 875-125 MG 1 tablet Oral 2 times daily 3. SEVERITY: How bad is the pain?   (Scale 0-10; or none, mild, moderate or severe)     mild 4. RECURRENT SYMPTOM: Have you ever had sinus problems before? If Yes, ask: When was the last time? and What happened that time?      Cent treatment- symptoms got better but not completely 5. NASAL CONGESTION: Is the nose blocked? If Yes, ask: Can you open it or must you breathe through your mouth?     Yes- patient able to open passages 6. NASAL DISCHARGE: Do you have discharge from your nose? If so ask, What color?     Discolored yellowish/green thick 7. FEVER: Do you have a fever? If Yes, ask: What is it, how was it measured, and when did it start?      no 8. OTHER SYMPTOMS: Do you have any other symptoms? (e.g., sore throat, cough, earache, difficulty breathing)     Pressure behind eyes, headache, feels fatigued  Patient is going out of town  and is requesting another Rx  Protocols used: Sinus Pain or Congestion-A-AH

## 2024-02-22 NOTE — Telephone Encounter (Signed)
 I would change to doxy.  I sent the rx.  Please get rechecked if not better.  Thanks.

## 2024-02-22 NOTE — Telephone Encounter (Signed)
 Unable to reach patient for triage x 3 attempts.

## 2024-02-25 NOTE — Telephone Encounter (Signed)
 Left message on voicemail, per dpr, relaying Dr Elfredia message.

## 2024-02-26 ENCOUNTER — Other Ambulatory Visit: Payer: Self-pay | Admitting: Medical Genetics

## 2024-02-27 ENCOUNTER — Other Ambulatory Visit (HOSPITAL_COMMUNITY): Payer: Self-pay

## 2024-03-03 ENCOUNTER — Other Ambulatory Visit (HOSPITAL_COMMUNITY): Payer: Self-pay

## 2024-03-04 ENCOUNTER — Encounter: Payer: Self-pay | Admitting: Family Medicine

## 2024-03-04 ENCOUNTER — Ambulatory Visit: Admitting: Family Medicine

## 2024-03-04 VITALS — BP 124/76 | HR 69 | Temp 98.2°F | Ht 68.0 in | Wt 270.8 lb

## 2024-03-04 DIAGNOSIS — J019 Acute sinusitis, unspecified: Secondary | ICD-10-CM | POA: Diagnosis not present

## 2024-03-04 DIAGNOSIS — R059 Cough, unspecified: Secondary | ICD-10-CM

## 2024-03-04 DIAGNOSIS — J329 Chronic sinusitis, unspecified: Secondary | ICD-10-CM

## 2024-03-04 DIAGNOSIS — B9689 Other specified bacterial agents as the cause of diseases classified elsewhere: Secondary | ICD-10-CM | POA: Diagnosis not present

## 2024-03-04 MED ORDER — DOXYCYCLINE HYCLATE 100 MG PO TABS: 100.0000 mg | ORAL_TABLET | Freq: Two times a day (BID) | ORAL | 0 refills | Status: DC

## 2024-03-04 NOTE — Patient Instructions (Signed)
 Please check with [facility] about follow up and restart doxy.  Take care.  Glad to see you.

## 2024-03-04 NOTE — Progress Notes (Unsigned)
 D/w pt about prev ENT eval.  He wasn't able to get back into [facility] clinic during recent illness.   2 weeks of symptoms.  Stuffy, HA, congestion, pressure, rhinorrhea.  Using flonase . Upper tooth pain, B molars.  No fevers.  No vomiting, no diarrhea.  Minimal ear pain. Has been taking doxy with partial improvement but not resolution.    Cough improved on prilosec.    Still on wegovy .  D/w pt about diet and exercise.    Meds, vitals, and allergies reviewed.   ROS: Per HPI unless specifically indicated in ROS section   Nad Ncat TM wnl Nasal exam without purulent discharge. Sinuses not tender currently. MMM OP w/o erythema Neck supple.  No lymphadenopathy. Rrr Ctab Skin well-perfused.

## 2024-03-05 ENCOUNTER — Other Ambulatory Visit

## 2024-03-05 DIAGNOSIS — R059 Cough, unspecified: Secondary | ICD-10-CM | POA: Insufficient documentation

## 2024-03-05 NOTE — Assessment & Plan Note (Signed)
 Improved on PPI.  He could have a cough variant of GERD.  Would continue PPI for now.

## 2024-03-05 NOTE — Assessment & Plan Note (Signed)
 Discussed previous [facility] eval.  I think he has a partially treated sinus infection.  I asked him to check with the [facility] clinic about follow-up and restart Doxy.  I hope that the extra 7 days of doxycycline  will take care of this.  I still question if his previous anatomic findings on direct visualization and CT are contributing to his frequent infections and I need ENT input.  He did improve in terms of his symptoms when he had previous sinus surgery but his symptoms/episodes have become more frequent more recently.  I need ENT input going forward.

## 2024-03-06 ENCOUNTER — Other Ambulatory Visit
Admission: RE | Admit: 2024-03-06 | Discharge: 2024-03-06 | Disposition: A | Discharge status: Home / Self Care | Payer: Self-pay | Source: Ambulatory Visit | Attending: Medical Genetics | Admitting: Medical Genetics

## 2024-03-17 ENCOUNTER — Encounter: Payer: Self-pay | Admitting: Family Medicine

## 2024-03-17 ENCOUNTER — Ambulatory Visit: Admitting: Family Medicine

## 2024-03-17 VITALS — BP 132/82 | HR 74 | Temp 97.7°F | Ht 68.0 in | Wt 273.2 lb

## 2024-03-17 DIAGNOSIS — M5441 Lumbago with sciatica, right side: Secondary | ICD-10-CM | POA: Diagnosis not present

## 2024-03-17 MED ORDER — CYCLOBENZAPRINE HCL 5 MG PO TABS: 5.0000 mg | ORAL_TABLET | Freq: Three times a day (TID) | ORAL | 0 refills | Status: DC | PRN

## 2024-03-17 MED ORDER — PREDNISONE 20 MG PO TABS: ORAL_TABLET | ORAL | 0 refills | Status: DC

## 2024-03-17 NOTE — Progress Notes (Unsigned)
 Prev sinus sx improved, done with doxy in the meantime.   Back pain.  Started a few days ago.  Pain bending over.  Pain going up stairs.  No trauma.  Tylenol  and ibuprofen help.  Used cyclobenzaprine , 5mg .  That helped.  Lower midline back pain.  Radiates to the R leg, down to the knee.  No FCNAVD.  Normal sensation in the leg.   Meds, vitals, and allergies reviewed.   ROS: Per HPI unless specifically indicated in ROS section   SLR neg but pain with flexion at the waist.

## 2024-03-17 NOTE — Patient Instructions (Signed)
 Prednisone  with food. Tylenol  if needed.  Try ice vs heat. Use the back exercises.  If not better we can set you up with PT.  Sedation caution on flexeril.   Take care.  Glad to see you.

## 2024-03-18 LAB — GENECONNECT MOLECULAR SCREEN: Genetic Analysis Overall Interpretation: NEGATIVE

## 2024-03-19 DIAGNOSIS — M545 Low back pain, unspecified: Secondary | ICD-10-CM | POA: Insufficient documentation

## 2024-03-19 NOTE — Assessment & Plan Note (Signed)
 Discussed options and anatomy. SABRA [place] for outpatient follow-up. Prednisone  with food. Tylenol  if needed.  Steroid cautions discussed. Try ice vs heat. Use the back exercises.  Home exercise program handout given to patient and explain/discussed. If not better we can set him up with PT.  Sedation caution on flexeril.   Update me as needed.  He agrees to plan.

## 2024-04-01 ENCOUNTER — Encounter: Payer: Self-pay | Admitting: Family Medicine

## 2024-04-02 ENCOUNTER — Other Ambulatory Visit: Payer: Self-pay | Admitting: Family Medicine

## 2024-04-02 ENCOUNTER — Ambulatory Visit: Payer: Managed Care, Other (non HMO)

## 2024-04-02 VITALS — Ht 68.0 in | Wt 261.0 lb

## 2024-04-02 DIAGNOSIS — Z0000 Encounter for general adult medical examination without abnormal findings: Secondary | ICD-10-CM | POA: Diagnosis not present

## 2024-04-02 DIAGNOSIS — I10 Essential (primary) hypertension: Secondary | ICD-10-CM

## 2024-04-02 DIAGNOSIS — R5383 Other fatigue: Secondary | ICD-10-CM

## 2024-04-02 DIAGNOSIS — Z125 Encounter for screening for malignant neoplasm of prostate: Secondary | ICD-10-CM

## 2024-04-02 NOTE — Telephone Encounter (Signed)
 Lab orders needed

## 2024-04-02 NOTE — Patient Instructions (Signed)
 Mr. Wigger,  Thank you for taking the time for your Medicare Wellness Visit. I appreciate your continued commitment to your health goals. Please review the care plan we discussed, and feel free to reach out if I can assist you further.  Please note that Annual Wellness Visits do not include a physical exam. Some assessments may be limited, especially if the visit was conducted virtually. If needed, we may recommend an in-person follow-up with your provider.  Ongoing Care Seeing your primary care provider every 3 to 6 months helps us  monitor your health and provide consistent, personalized care.   Referrals If a referral was made during today's visit and you haven't received any updates within two weeks, please contact the referred provider directly to check on the status.  Recommended Screenings:  Health Maintenance  Topic Date Due   Pneumococcal Vaccine for age over 25 (2 of 2 - PCV) 03/29/2021   Flu Shot  07/15/2024*   Colon Cancer Screening  05/27/2027   DTaP/Tdap/Td vaccine (4 - Td or Tdap) 10/31/2028   Hepatitis C Screening  Completed   Zoster (Shingles) Vaccine  Completed   Meningitis B Vaccine  Aged Out   COVID-19 Vaccine  Discontinued  *Topic was postponed. The date shown is not the original due date.       04/02/2024    9:43 AM  Advanced Directives  Does Patient Have a Medical Advance Directive? No    Vision: Annual vision screenings are recommended for early detection of glaucoma, cataracts, and diabetic retinopathy. These exams can also reveal signs of chronic conditions such as diabetes and high blood pressure.  Dental: Annual dental screenings help detect early signs of oral cancer, gum disease, and other conditions linked to overall health, including heart disease and diabetes.

## 2024-04-02 NOTE — Progress Notes (Signed)
 Please attest and cosign this visit due to patients primary care provider not being in the office at the time the visit was completed.    Chief Complaint  Patient presents with   Medicare Wellness     Subjective:   Dustin [name] is a 68 y.o. male who presents for a Medicare Annual Wellness Visit.  Visit info / Clinical Intake: Medicare Wellness Visit Type:: Subsequent Annual Wellness Visit Persons participating in visit and providing information:: patient Medicare Wellness Visit Mode:: Telephone If telephone:: video declined Since this visit was completed virtually, some vitals may be partially provided or unavailable. Missing vitals are due to the limitations of the virtual format.: Unable to obtain vitals - no equipment If Telephone or Video please confirm:: I connected with patient using audio/video enable telemedicine. I verified patient identity with two identifiers, discussed telehealth limitations, and patient agreed to proceed. Patient Location:: home Provider Location:: clinic Interpreter Needed?: No Pre-visit prep was completed: yes AWV questionnaire completed by patient prior to visit?: no Living arrangements:: lives with spouse/significant other Patient's Overall Health Status Rating: excellent Typical amount of pain: none Does pain affect daily life?: no Are you currently prescribed opioids?: no  Dietary Habits and Nutritional Risks How many meals a day?: 3 Eats fruit and vegetables daily?: yes Most meals are obtained by: preparing own meals; eating out In the last 2 weeks, have you had any of the following?: none Diabetic:: no  Functional Status Activities of Daily Living (to include ambulation/medication): Independent Ambulation: Independent Medication Administration: Independent Home Management (perform basic housework or laundry): Independent Manage your own finances?: yes Primary transportation is: driving Concerns about vision?: no *vision screening  is required for WTM* Concerns about hearing?: no  Fall Screening Falls in the past year?: 0 Number of falls in past year: 0 Was there an injury with Fall?: 0 Fall Risk Category Calculator: 0 Patient Fall Risk Level: Low Fall Risk  Fall Risk Patient at Risk for Falls Due to: No Fall Risks Fall risk Follow up: Falls evaluation completed; Education provided; Falls prevention discussed  Home and Transportation Safety: All rugs have non-skid backing?: N/A, no rugs All stairs or steps have railings?: yes Grab bars in the bathtub or shower?: (!) no Have non-skid surface in bathtub or shower?: (!) no Good home lighting?: yes Regular seat belt use?: yes Hospital stays in the last year:: no  Cognitive Assessment Difficulty concentrating, remembering, or making decisions? : no Will 6CIT or Mini Cog be Completed: yes Give patient an address phrase to remember (5 components): [address] [place]   [employer] Officer (For Healthcare) Does Patient Have a Medical Advance Directive?: No  Reviewed/Updated  Reviewed/Updated: Reviewed All (Medical, Surgical, Family, Medications, Allergies, Care Teams, Patient Goals)    Allergies (verified) Antihistamines, diphenhydramine-type; Erythromycin; Flomax  [tamsulosin ]; Guaifenesin  & derivatives; and Metoprolol    Current Medications (verified) Outpatient Encounter Medications as of 04/02/2024  Medication Sig   aspirin  EC 81 MG tablet Take 1 tablet (81 mg total) by mouth daily. Swallow whole.   atorvastatin  (LIPITOR) 20 MG tablet TAKE 1 TABLET DAILY   cetirizine (ZYRTEC) 10 MG tablet Take 10 mg by mouth daily.   Coenzyme Q10 50 MG CHEW 500 mg 2 (two) times daily.   cyclobenzaprine  (FLEXERIL ) 5 MG tablet Take 1 tablet (5 mg total) by mouth 3 (three) times daily as needed for muscle spasms. Sedation caution.   ezetimibe  (ZETIA ) 10 MG tablet TAKE 1 TABLET DAILY   Garlic 300 MG CAPS Take  600 mg by mouth 2 (two) times daily.    hydrochlorothiazide  (HYDRODIURIL ) 12.5 MG tablet Take 1 tablet (12.5 mg total) by mouth daily.   metoprolol  succinate (TOPROL -XL) 25 MG 24 hr tablet TAKE 1 TABLET DAILY WITH ORIMMEDIATELY FOLLOWING A    MEAL   montelukast  (SINGULAIR ) 10 MG tablet TAKE 1 TABLET AT BEDTIME   naproxen  (NAPROSYN ) 500 MG tablet Take 1 tablet (500 mg total) by mouth 2 (two) times daily with a meal.   omeprazole  (PRILOSEC) 20 MG capsule Take 1 capsule (20 mg total) by mouth daily.   semaglutide -weight management (WEGOVY ) 2.4 MG/0.75ML SOAJ SQ injection Inject 2.4 mg into the skin once a week.   tadalafil  (CIALIS ) 5 MG tablet TAKE 1/2 TO 1 TABLET BY MOUTH DAILY   XHANCE  93 MCG/ACT EXHU Place 1 spray into both nostrils 2 (two) times daily.   benzonatate  (TESSALON ) 100 MG capsule Take 1 capsule (100 mg total) by mouth every 8 (eight) hours. (Patient not taking: Reported on 04/02/2024)   predniSONE  (DELTASONE ) 20 MG tablet Take 2 a day for 5 days, then 1 a day for 5 days, with food. Don't take with aleve /ibuprofen. (Patient not taking: Reported on 04/02/2024)   No facility-administered encounter medications on file as of 04/02/2024.    History: Past Medical History:  Diagnosis Date   Allergy    Arthritis Left elbow   Left Knee   Hyperlipidemia    Hypertension    Neuromuscular disorder (HCC)    Sleep apnea    Vertigo 06/03/2015   1 episode   Wears hearing aid in both ears    Past Surgical History:  Procedure Laterality Date   CATARACT EXTRACTION Bilateral    COLONOSCOPY WITH PROPOFOL      COLONOSCOPY WITH PROPOFOL  N/A 05/26/2022   Procedure: COLONOSCOPY WITH PROPOFOL ;  Surgeon: Maryruth Ole DASEN, MD;  Location: [facility];  Service: Endoscopy;  Laterality: N/A;   ETHMOIDECTOMY Bilateral 02/27/2019   Procedure: ETHMOIDECTOMY;  Surgeon: Edda Mt, MD;  Location: [facility] [place];  Service: ENT;  Laterality: Bilateral;   EYE SURGERY  20 years ago   FRONTAL SINUS EXPLORATION Bilateral 02/27/2019    Procedure: FRONTAL SINUS EXPLORATION;  Surgeon: Edda Mt, MD;  Location: [facility];  Service: ENT;  Laterality: Bilateral;   IMAGE GUIDED SINUS SURGERY Bilateral 02/27/2019   Procedure: IMAGE GUIDED SINUS SURGERY;  Surgeon: Edda Mt, MD;  Location: [facility];  Service: ENT;  Laterality: Bilateral;   LASIK Bilateral 2005   Dr Meridee   MAXILLARY ANTROSTOMY Bilateral 02/27/2019   Procedure: MAXILLARY ANTROSTOMY WITH TISSUE REMOVAL;  Surgeon: Edda Mt, MD;  Location: [facility];  Service: ENT;  Laterality: Bilateral;   SEPTOPLASTY Bilateral 02/27/2019   Procedure: SEPTOPLASTY;  Surgeon: Edda Mt, MD;  Location: [facility];  Service: ENT;  Laterality: Bilateral;  disk put on desk in charge nurse office, 02/06/2019 ds put 2nd disk on charge nurse desk 10-29 kp   TURBINATE REDUCTION Bilateral 02/27/2019   Procedure: TURBINATE REDUCTION;  Surgeon: Edda Mt, MD;  Location: [facility];  Service: ENT;  Laterality: Bilateral;   TYMPANOSTOMY TUBE PLACEMENT     VASECTOMY  04/1995   Family History  Problem Relation Age of Onset   Hyperlipidemia Mother    Hypertension Mother    Coronary artery disease Father    Stroke Father    Cancer Father        prostate   Heart disease Father    Prostate cancer Father  Heart attack Father    COPD Sister    Diabetes Paternal Uncle    Heart disease Paternal Uncle    Diabetes Paternal Grandfather    Heart disease Paternal Uncle    Colon cancer Neg Hx    Social History   Occupational History   Occupation: Development Worker, [employer] with companies  Tobacco Use   Smoking status: Former    Current packs/day: 0.00    Average packs/day: 0.5 packs/day for 4.0 years (2.0 ttl pk-yrs)    Types: Cigarettes    Start date: 27    Quit date: 1975    Years since quitting: 50.9   Smokeless tobacco: Never  Vaping Use   Vaping status: Never Used  Substance and Sexual Activity   Alcohol use:  Yes    Alcohol/week: 0.0 standard drinks of alcohol    Comment: occ glass of wine/week   Drug use: No   Sexual activity: Not on file   Tobacco Counseling Counseling given: Not Answered  SDOH Screenings   Food Insecurity: No Food Insecurity (04/02/2024)  Housing: Unknown (04/02/2024)  Transportation Needs: No Transportation Needs (04/02/2024)  Utilities: Not At Risk (04/02/2024)  Alcohol Screen: Low Risk (04/02/2024)  Depression (PHQ2-9): Low Risk (04/02/2024)  Financial Resource Strain: Low Risk (04/02/2024)  Physical Activity: Inactive (04/02/2024)  Social Connections: Socially Integrated (04/02/2024)  Stress: No Stress Concern Present (04/02/2024)  Tobacco Use: Medium Risk (04/02/2024)  Health Literacy: Adequate Health Literacy (04/02/2024)   See flowsheets for full screening details  Depression Screen PHQ 2 & 9 Depression Scale- Over the past 2 weeks, how often have you been bothered by any of the following problems? Little interest or pleasure in doing things: 0 Feeling down, depressed, or hopeless (PHQ Adolescent also includes...irritable): 0 PHQ-2 Total Score: 0 Trouble falling or staying asleep, or sleeping too much: 0 Feeling tired or having little energy: 0 Poor appetite or overeating (PHQ Adolescent also includes...weight loss): 0 Feeling bad about yourself - or that you are a failure or have let yourself or your family down: 0 Trouble concentrating on things, such as reading the newspaper or watching television (PHQ Adolescent also includes...like school work): 0 Moving or speaking so slowly that other people could have noticed. Or the opposite - being so fidgety or restless that you have been moving around a lot more than usual: 0 Thoughts that you would be better off dead, or of hurting yourself in some way: 0 PHQ-9 Total Score: 0 If you checked off any problems, how difficult have these problems made it for you to do your work, take care of things at home, or get  along with other people?: Not difficult at all     Goals Addressed             This Visit's Progress    I would like to do some walking and continue to lose weight       Patient Stated   On track    03/24/2020, I will maintain and continue medications as prescribed.      Patient Stated   On track    Would like to eat healthier, drink more water and walk more             Objective:    Today's Vitals   04/02/24 0938  Weight: 261 lb (118.4 kg)  Height: 5' 8 (1.727 m)   Body mass index is 39.68 kg/m.  Hearing/Vision screen No results found. Immunizations and Health Maintenance Health Maintenance  Topic Date Due  Pneumococcal Vaccine: 50+ Years (2 of 2 - PCV) 03/29/2021   Influenza Vaccine  07/15/2024 (Originally 11/16/2023)   Colonoscopy  05/27/2027   DTaP/Tdap/Td (4 - Td or Tdap) 10/31/2028   Hepatitis C Screening  Completed   Zoster Vaccines- Shingrix  Completed   Meningococcal B Vaccine  Aged Out   COVID-19 Vaccine  Discontinued        Assessment/Plan:  This is a routine wellness examination for Elsie.  Patient Care Team: Cleatus Arlyss RAMAN, MD as PCP - General Geronimo Manuelita SAUNDERS, RPH-CPP as Pharmacist (Pharmacist) Perla Evalene PARAS, MD as Consulting Physician (Cardiology) Pa, [facility], [facility]  I have personally reviewed and noted the following in the patients chart:   Medical and social history Use of alcohol, tobacco or illicit drugs  Current medications and supplements including opioid prescriptions. Functional ability and status Nutritional status Physical activity Advanced directives List of other physicians Hospitalizations, surgeries, and ER visits in previous 12 months Vitals Screenings to include cognitive, depression, and falls Referrals and appointments  No orders of the defined types were placed in this encounter.  In addition, I have reviewed and discussed with patient certain preventive protocols,  quality metrics, and best practice recommendations. A written personalized care plan for preventive services as well as general preventive health recommendations were provided to patient.   Erminio LITTIE Saris, LPN   [id number]   No follow-ups on file.  After Visit Summary: (MyChart) Due to this being a telephonic visit, the after visit summary with patients personalized plan was offered to patient via MyChart   Nurse Notes: No voiced or noted concerns at this time Patient advised to keep follow-up appointment with PCP (Dec 2025) Appointment(s) made: (AWV/CPE Dec 2026) HM Addressed: Vaccines Due: PCV in office this month

## 2024-04-03 ENCOUNTER — Other Ambulatory Visit

## 2024-04-03 DIAGNOSIS — R5383 Other fatigue: Secondary | ICD-10-CM

## 2024-04-03 DIAGNOSIS — Z125 Encounter for screening for malignant neoplasm of prostate: Secondary | ICD-10-CM | POA: Diagnosis not present

## 2024-04-03 DIAGNOSIS — I10 Essential (primary) hypertension: Secondary | ICD-10-CM | POA: Diagnosis not present

## 2024-04-03 LAB — CBC WITH DIFFERENTIAL/PLATELET
Basophils Absolute: 0 K/uL (ref 0.0–0.1)
Basophils Relative: 0.2 % (ref 0.0–3.0)
Eosinophils Absolute: 0.2 K/uL (ref 0.0–0.7)
Eosinophils Relative: 2.3 % (ref 0.0–5.0)
HCT: 42.4 % (ref 39.0–52.0)
Hemoglobin: 14.4 g/dL (ref 13.0–17.0)
Lymphocytes Relative: 20.7 % (ref 12.0–46.0)
Lymphs Abs: 1.4 K/uL (ref 0.7–4.0)
MCHC: 33.9 g/dL (ref 30.0–36.0)
MCV: 92.3 fl (ref 78.0–100.0)
Monocytes Absolute: 0.4 K/uL (ref 0.1–1.0)
Monocytes Relative: 5.7 % (ref 3.0–12.0)
Neutro Abs: 4.9 K/uL (ref 1.4–7.7)
Neutrophils Relative %: 71.1 % (ref 43.0–77.0)
Platelets: 205 K/uL (ref 150.0–400.0)
RBC: 4.59 Mil/uL (ref 4.22–5.81)
RDW: 13.7 % (ref 11.5–15.5)
WBC: 6.9 K/uL (ref 4.0–10.5)

## 2024-04-03 LAB — COMPREHENSIVE METABOLIC PANEL WITH GFR
ALT: 20 U/L (ref 3–53)
AST: 17 U/L (ref 5–37)
Albumin: 4.2 g/dL (ref 3.5–5.2)
Alkaline Phosphatase: 84 U/L (ref 39–117)
BUN: 18 mg/dL (ref 6–23)
CO2: 30 meq/L (ref 19–32)
Calcium: 9.2 mg/dL (ref 8.4–10.5)
Chloride: 99 meq/L (ref 96–112)
Creatinine, Ser: 0.86 mg/dL (ref 0.40–1.50)
GFR: 87.96 mL/min (ref >60.00)
Glucose, Bld: 168 mg/dL — ABNORMAL HIGH (ref 70–99)
Potassium: 4.4 meq/L (ref 3.5–5.1)
Sodium: 138 meq/L (ref 135–145)
Total Bilirubin: 0.5 mg/dL (ref 0.2–1.2)
Total Protein: 6.5 g/dL (ref 6.0–8.3)

## 2024-04-03 LAB — LIPID PANEL
Cholesterol: 109 mg/dL (ref 28–200)
HDL: 45.1 mg/dL (ref >39.00)
LDL Cholesterol: 33 mg/dL (ref 10–99)
NonHDL: 64.12
Total CHOL/HDL Ratio: 2
Triglycerides: 155 mg/dL — ABNORMAL HIGH (ref 10.0–149.0)
VLDL: 31 mg/dL (ref 0.0–40.0)

## 2024-04-03 LAB — PSA, MEDICARE: PSA: 0.48 ng/mL (ref 0.10–4.00)

## 2024-04-03 LAB — VITAMIN B12: Vitamin B-12: 302 pg/mL (ref 211–911)

## 2024-04-03 LAB — TSH: TSH: 1.42 u[IU]/mL (ref 0.35–5.50)

## 2024-04-08 ENCOUNTER — Encounter: Admitting: Family Medicine

## 2024-04-08 ENCOUNTER — Ambulatory Visit: Payer: Self-pay | Admitting: Family Medicine

## 2024-04-14 ENCOUNTER — Other Ambulatory Visit: Payer: Self-pay | Admitting: Family Medicine

## 2024-04-14 ENCOUNTER — Telehealth: Admitting: Emergency Medicine

## 2024-04-14 ENCOUNTER — Other Ambulatory Visit: Payer: Self-pay

## 2024-04-14 DIAGNOSIS — J329 Chronic sinusitis, unspecified: Secondary | ICD-10-CM | POA: Diagnosis not present

## 2024-04-14 MED ORDER — AMOXICILLIN-POT CLAVULANATE 875-125 MG PO TABS: 1.0000 | ORAL_TABLET | Freq: Two times a day (BID) | ORAL | 0 refills | Status: DC

## 2024-04-14 MED ORDER — WEGOVY 2.4 MG/0.75ML ~~LOC~~ SOAJ
2.4000 mg | SUBCUTANEOUS | 0 refills | Status: DC
  Filled 2024-04-14: qty 3, 28d supply, fill #0

## 2024-04-14 NOTE — Progress Notes (Signed)
 "    E-Visit for Sinus Problems  We are sorry that you are not feeling well.  Here is how we plan to help!  Based on what you have shared with me it looks like you have sinusitis.  Sinusitis is inflammation and infection in the sinus cavities of the head.  Based on your presentation I believe you most likely have Acute Bacterial Sinusitis.  This is an infection caused by bacteria and is treated with antibiotics. I have prescribed Augmentin  875mg /125mg  one tablet twice daily with food, for 7 days.   Please follow up with ENT as recommended by Dr. Cleatus for your recurrent sinus infections.   You may use plain Mucinex .   Saline nasal spray help and can safely be used as often as needed for congestion.  Try using saline irrigation, such as with a neti pot, several times a day while you are sick. Many neti pots come with salt packets premeasured to use to make saline. If you use your own salt, make sure it is kosher salt or sea salt (don't use table salt as it has iodine in it and you don't need that in your nose). Use distilled water to make saline. If you mix your own saline using your own salt, the recipe is 1/4 teaspoon salt in 1 cup warm water. Using saline irrigation can help prevent and treat sinus infections.   If you develop worsening sinus pain, fever or notice severe headache and vision changes, or if symptoms are not better after completion of antibiotic, please schedule an appointment with a health care provider.    Sinus infections are not as easily transmitted as other respiratory infection, however we still recommend that you avoid close contact with loved ones, especially the very young and elderly.  Remember to wash your hands thoroughly throughout the day as this is the number one way to prevent the spread of infection!  Home Care: Only take medications as instructed by your medical team. Complete the entire course of an antibiotic. Do not take these medications with alcohol. A  steam or ultrasonic humidifier can help congestion.  You can place a towel over your head and breathe in the steam from hot water coming from a faucet. Avoid close contacts especially the very young and the elderly. Cover your mouth when you cough or sneeze. Always remember to wash your hands.  Get Help Right Away If: You develop worsening fever or sinus pain. You develop a severe head ache or visual changes. Your symptoms persist after you have completed your treatment plan.  Make sure you Understand these instructions. Will watch your condition. Will get help right away if you are not doing well or get worse.  Your e-visit answers were reviewed by a board certified advanced clinical practitioner to complete your personal care plan.  Depending on the condition, your plan could have included both over the counter or prescription medications.  If there is a problem please reply  once you have received a response from your provider.  Your safety is important to us .  If you have drug allergies check your prescription carefully.    You can use MyChart to ask questions about todays visit, request a non-urgent call back, or ask for a work or school excuse for 24 hours related to this e-Visit. If it has been greater than 24 hours you will need to follow up with your provider, or enter a new e-Visit to address those concerns.  You will get an  e-mail in the next two days asking about your experience.  I hope that your e-visit has been valuable and will speed your recovery. Thank you for using e-visits.  I have spent 5 minutes in review of e-visit questionnaire, review and updating patient chart, medical decision making and response to patient.   Jon Belt, PhD, FNP-BC       "

## 2024-04-25 ENCOUNTER — Other Ambulatory Visit: Payer: Self-pay

## 2024-04-25 ENCOUNTER — Encounter: Admitting: Family Medicine

## 2024-04-25 ENCOUNTER — Encounter: Payer: Self-pay | Admitting: Family Medicine

## 2024-04-25 MED ORDER — EZETIMIBE 10 MG PO TABS: 10.0000 mg | ORAL_TABLET | Freq: Every day | ORAL | 0 refills | Status: AC

## 2024-04-25 MED ORDER — ATORVASTATIN CALCIUM 20 MG PO TABS: 20.0000 mg | ORAL_TABLET | Freq: Every day | ORAL | 0 refills | Status: AC

## 2024-04-26 ENCOUNTER — Telehealth: Payer: Self-pay | Admitting: Family Medicine

## 2024-04-26 DIAGNOSIS — B9689 Other specified bacterial agents as the cause of diseases classified elsewhere: Secondary | ICD-10-CM | POA: Diagnosis not present

## 2024-04-26 DIAGNOSIS — J019 Acute sinusitis, unspecified: Secondary | ICD-10-CM

## 2024-04-26 MED ORDER — PREDNISONE 20 MG PO TABS: ORAL_TABLET | ORAL | 0 refills | Status: DC

## 2024-04-26 MED ORDER — FLUTICASONE PROPIONATE 50 MCG/ACT NA SUSP: 2.0000 | Freq: Every day | NASAL | 0 refills | Status: AC

## 2024-04-26 NOTE — Progress Notes (Signed)
 We are sorry that you are not feeling well.  Here is how we plan to help!  Based on what you have shared with me it looks like you have sinusitis.  Sinusitis is inflammation and infection in the sinus cavities of the head.  Based on your presentation I believe you most likely have Acute Viral Sinusitis.This is an infection most likely caused by a virus. There is not specific treatment for viral sinusitis other than to help you with the symptoms until the infection runs its course.  You may use an oral decongestant such as Mucinex  D or if you have glaucoma or high blood pressure use plain Mucinex . Saline nasal spray help and can safely be used as often as needed for congestion, I have prescribed: Fluticasone  nasal spray two sprays in each nostril once a day  I also sent prednisone .   Some authorities believe that zinc sprays or the use of Echinacea may shorten the course of your symptoms.  Sinus infections are not as easily transmitted as other respiratory infection, however we still recommend that you avoid close contact with loved ones, especially the very young and elderly.  Remember to wash your hands thoroughly throughout the day as this is the number one way to prevent the spread of infection!  Home Care: Only take medications as instructed by your medical team. Do not take these medications with alcohol. A steam or ultrasonic humidifier can help congestion.  You can place a towel over your head and breathe in the steam from hot water coming from a faucet. Avoid close contacts especially the very young and the elderly. Cover your mouth when you cough or sneeze. Always remember to wash your hands.  Get Help Right Away If: You develop worsening fever or sinus pain. You develop a severe head ache or visual changes. Your symptoms persist after you have completed your treatment plan.  Make sure you Understand these instructions. Will watch your condition. Will get help right away if you are  not doing well or get worse.  Your e-visit answers were reviewed by a board certified advanced clinical practitioner to complete your personal care plan.  Depending on the condition, your plan could have included both over the counter or prescription medications.  If there is a problem please reply  once you have received a response from your provider.  Your safety is important to us .  If you have drug allergies check your prescription carefully.    You can use MyChart to ask questions about todays visit, request a non-urgent call back, or ask for a work or school excuse for 24 hours related to this e-Visit. If it has been greater than 24 hours you will need to follow up with your provider, or enter a new e-Visit to address those concerns.  You will get an e-mail in the next two days asking about your experience.  I hope that your e-visit has been valuable and will speed your recovery. Thank you for using e-visits.  I have spent 5 minutes in review of e-visit questionnaire, review and updating patient chart, medical decision making and response to patient.   Jaykwon Morones, FNP

## 2024-04-27 NOTE — Telephone Encounter (Signed)
 See MyChart message and please make sure this was addressed.  Thank you.

## 2024-04-30 ENCOUNTER — Other Ambulatory Visit: Payer: Self-pay | Admitting: Family Medicine

## 2024-05-02 ENCOUNTER — Other Ambulatory Visit (HOSPITAL_COMMUNITY): Payer: Self-pay

## 2024-05-02 ENCOUNTER — Telehealth: Payer: Self-pay

## 2024-05-02 ENCOUNTER — Other Ambulatory Visit: Payer: Self-pay | Admitting: Family Medicine

## 2024-05-02 ENCOUNTER — Ambulatory Visit: Payer: Self-pay

## 2024-05-02 DIAGNOSIS — J0141 Acute recurrent pansinusitis: Secondary | ICD-10-CM

## 2024-05-02 MED ORDER — AMOXICILLIN-POT CLAVULANATE 875-125 MG PO TABS: 1.0000 | ORAL_TABLET | Freq: Two times a day (BID) | ORAL | 0 refills | Status: DC

## 2024-05-02 NOTE — Telephone Encounter (Signed)
 Pharmacy Patient Advocate Encounter   Received notification from [facility] that prior authorization for Tadalafil  5 mg tabs is required/requested.   Insurance verification completed.   The patient is insured through [facility] [employer]ER[employer].   Per test claim: PA required; PA submitted to above mentioned insurance via Latent Key/confirmation #/EOC [id number] Status is pending

## 2024-05-02 NOTE — Telephone Encounter (Signed)
 FYI Only or Action Required?: Action required by provider: update on patient condition and requesting medication sent to [facility] - leaving town at noon thirty.  Patient was last seen in primary care on 03/17/2024 by Cleatus Arlyss RAMAN, MD.  Called Nurse Triage reporting Sinusitis.  Symptoms began several weeks ago.  Interventions attempted: OTC medications: tylenol , sinus rinse and Rest, hydration, or home remedies.  Symptoms are: gradually worsening.  Triage Disposition: See PCP When Office is Open (Within 3 Days)  Patient/caregiver understands and will follow disposition?: No, wishes to speak with PCP  Reason for Disposition  [1] Sinus congestion (pressure, fullness) AND [2] present > 10 days  Answer Assessment - Initial Assessment Questions Chronic sinus infections- most recent course of ABX 12/29 and never completely resolved. Still with green mucous production, sinus forehead pressure and pain, and ear ache on the right side. Pain 3/10.  Sinus washes and Tylenol  help- trying not to take daily.  Denies CP, SOB, Fever, Dizziness.   Leaving for [place] at 12:30pm. Asking if another round of Augmentin  can be sent to pharmacy on file in [place] to help resolve sinus infection/ earache.     1. LOCATION: Where does it hurt?      Bilateral - forehead 2. ONSET: When did the sinus pain start?  (e.g., hours, days)      Ongoing chronic issue  3. SEVERITY: How bad is the pain?   (Scale 0-10; or none, mild, moderate or severe)     3-4/10 4. RECURRENT SYMPTOM: Have you ever had sinus problems before? If Yes, ask: When was the last time? and What happened that time?      Recurrent  5. NASAL CONGESTION: Is the nose blocked? If Yes, ask: Can you open it or must you breathe through your mouth?     Forehead and jaw 6. NASAL DISCHARGE: Do you have discharge from your nose? If so ask, What color?     Green- worse every couple days  7. FEVER: Do you have a  fever? If Yes, ask: What is it, how was it measured, and when did it start?      denies 8. OTHER SYMPTOMS: Do you have any other symptoms? (e.g., sore throat, cough, earache, difficulty breathing)     Earache on Right side like congestion/pressure  Protocols used: Sinus Pain or Congestion-A-AH

## 2024-05-02 NOTE — Telephone Encounter (Signed)
 See triage encounter--  Asking for med sent to pharmacy in [place]- confirmed

## 2024-05-02 NOTE — Telephone Encounter (Signed)
Noted.  Sent.  Thanks. 

## 2024-05-02 NOTE — Telephone Encounter (Signed)
 Pharmacy Patient Advocate Encounter  Received notification from [facility] [employer] that Prior Authorization for Tadalafil  5 has been Partially approved for 8 tabs per 30 days 05/02/24 thru 05/02/25.    PA #/Case ID/Reference #: # [id number]  Patient copay is $10.07

## 2024-05-02 NOTE — Telephone Encounter (Signed)
 Copied from CRM [id number]. Topic: Clinical - Medication Refill >> May 02, 2024  8:26 AM Dustin [name] wrote: Medication: amoxicillin -clavulanate (AUGMENTIN ) 875-125 MG tablet [[id number]]  Has the patient contacted their pharmacy? No (Agent: If no, request that the patient contact the pharmacy for the refill. If patient does not wish to contact the pharmacy document the reason why and proceed with request.) (Agent: If yes, when and what did the pharmacy advise?)  This is the patient's preferred pharmacy:  [facility] - [place], [place] - [name] [place] [address] Phone: [phone] Fax: [phone]   Is this the correct pharmacy for this prescription? Yes If no, delete pharmacy and type the correct one.   Has the prescription been filled recently? Yes  Is the patient out of the medication? Yes  Has the patient been seen for an appointment in the last year OR does the patient have an upcoming appointment? Yes  Can we respond through MyChart? No  Agent: Please be advised that Rx refills may take up to 3 business days. We ask that you follow-up with your pharmacy.

## 2024-05-02 NOTE — Telephone Encounter (Signed)
 Noted.  Rx sent.

## 2024-05-02 NOTE — Telephone Encounter (Signed)
 Mychart sent to patient.

## 2024-05-05 ENCOUNTER — Encounter: Payer: Self-pay | Admitting: Family Medicine

## 2024-05-09 MED ORDER — TADALAFIL 5 MG PO TABS: 2.5000 mg | ORAL_TABLET | Freq: Every day | ORAL | 0 refills | Status: DC

## 2024-05-09 MED ORDER — WEGOVY 2.4 MG/0.75ML ~~LOC~~ SOAJ: 2.4000 mg | SUBCUTANEOUS | 0 refills | Status: DC

## 2024-05-09 MED ORDER — METOPROLOL SUCCINATE ER 25 MG PO TB24: ORAL_TABLET | ORAL | 0 refills | Status: AC

## 2024-05-09 MED ORDER — MONTELUKAST SODIUM 10 MG PO TABS: 10.0000 mg | ORAL_TABLET | Freq: Every day | ORAL | 0 refills | Status: AC

## 2024-05-09 MED ORDER — HYDROCHLOROTHIAZIDE 12.5 MG PO TABS: 12.5000 mg | ORAL_TABLET | Freq: Every day | ORAL | 0 refills | Status: AC

## 2024-05-09 NOTE — Addendum Note (Signed)
 Addended by: SEBASTIAN DANNA GRADE on: 05/09/2024 05:03 PM   Modules accepted: Orders

## 2024-05-19 ENCOUNTER — Telehealth: Payer: Self-pay | Admitting: Pharmacy Technician

## 2024-05-19 ENCOUNTER — Other Ambulatory Visit (HOSPITAL_COMMUNITY): Payer: Self-pay

## 2024-05-19 NOTE — Telephone Encounter (Signed)
 Pharmacy Patient Advocate Encounter   Received notification from [employer] that prior authorization for Wegovy  2.4MG /0.75ML auto-injectors  is required/requested.   Insurance verification completed.   The patient is insured through [facility].   Per test claim: PA required; PA submitted to above mentioned insurance via Latent Key/confirmation #/EOC [facility] Status is pending

## 2024-05-20 ENCOUNTER — Encounter: Payer: Self-pay | Admitting: Family Medicine

## 2024-05-20 ENCOUNTER — Ambulatory Visit: Admitting: Family Medicine

## 2024-05-20 VITALS — BP 120/68 | HR 62 | Temp 97.9°F | Ht 67.0 in | Wt 275.2 lb

## 2024-05-20 DIAGNOSIS — R739 Hyperglycemia, unspecified: Secondary | ICD-10-CM

## 2024-05-20 DIAGNOSIS — Z7189 Other specified counseling: Secondary | ICD-10-CM

## 2024-05-20 DIAGNOSIS — E785 Hyperlipidemia, unspecified: Secondary | ICD-10-CM | POA: Diagnosis not present

## 2024-05-20 DIAGNOSIS — Z0000 Encounter for general adult medical examination without abnormal findings: Secondary | ICD-10-CM

## 2024-05-20 DIAGNOSIS — J329 Chronic sinusitis, unspecified: Secondary | ICD-10-CM

## 2024-05-20 DIAGNOSIS — Z6841 Body Mass Index (BMI) 40.0 and over, adult: Secondary | ICD-10-CM | POA: Insufficient documentation

## 2024-05-20 DIAGNOSIS — R059 Cough, unspecified: Secondary | ICD-10-CM

## 2024-05-20 DIAGNOSIS — I10 Essential (primary) hypertension: Secondary | ICD-10-CM

## 2024-05-20 DIAGNOSIS — J0141 Acute recurrent pansinusitis: Secondary | ICD-10-CM

## 2024-05-20 LAB — POCT GLYCOSYLATED HEMOGLOBIN (HGB A1C): Hemoglobin A1C: 5.8 % — AB (ref 4.0–5.6)

## 2024-05-20 MED ORDER — WEGOVY 2.4 MG/0.75ML ~~LOC~~ SOAJ: 2.4000 mg | SUBCUTANEOUS | 5 refills | Status: AC

## 2024-05-20 MED ORDER — TADALAFIL 5 MG PO TABS: 2.5000 mg | ORAL_TABLET | Freq: Every day | ORAL | 3 refills | Status: AC

## 2024-05-20 MED ORDER — OMEPRAZOLE 20 MG PO CPDR: 20.0000 mg | DELAYED_RELEASE_CAPSULE | Freq: Every day | ORAL | Status: AC | PRN

## 2024-05-20 NOTE — Telephone Encounter (Signed)
 Pharmacy Patient Advocate Encounter  Received notification from [facility] that Prior Authorization for Wegovy  2.4MG /0.75ML auto-injectors  has been DENIED.  Full denial letter will be uploaded to the media tab. See denial reason below. We denied your request because we did not see what we need to approve the drug you asked for, (Wegovy ). We may be able to approve this drug when we see certain records (records that you are using the requested drug for disease that affects the heart or blood vessels [established cardiovascular disease] [previous myocardial infarction; previous stroke (ischemic or hemorrhagic stroke); or symptomatic peripheral arterial disease as evidenced by intermittent claudication with ankle-brachial index less than 0.85 (at rest), peripheral arterial revascularization procedure, or amputation due to atherosclerotic disease]; if we have records of a certain blood test that gives an average of your blood sugar over the past 2 to 3 months; records within the last 90 days that you have a weight and height measurement [body mass index (BMI)] of 27 kilograms per meter squared [kg/m2] or more before taking this drug; records that you will use this drug in addition to certain other drugs used to treat disease that affects the heart or blood vessels [you have been established on guideline-directed medication therapy (GDMT) for established cardiovascular disease]; records within the last 90 days that you have a weight and height measurement [body mass index (BMI)] of 30 kilograms per meter squared [kg/m2] or more when you were prescribed this drug; records within the last 90 days that you have a weight and height measurement [body mass index (BMI)] of 27 kilograms per meter squared [kg/m2] or more when you were prescribed this drug and you also have at least one of the following health conditions caused by your weight: high blood pressure [either treated hypertension or a systolic blood pressure of  130 millimeters of mercury (mmHg) or greater, or a diastolic blood pressure 80 mmHg or greater], a blood sugar illness called type 2 diabetes mellitus, high cholesterol [either treated dyslipidemia or a low-density lipoprotein (LDL) of 160 milligrams per deciliter (mg/dL) (4.1 millimoles per liter [mmol/L]) or greater, triglycerides 150 mg/dL (1.7 mmol/L) or greater, or high-density lipoprotein (HDL) less than 40 mg/dL (1 mmol/L), or sleeping condition where you stop breathing for short times while sleeping called obstructive sleep apnea that is caused by your weight [body mass index (BMI) of 27 kilograms per meter squared (kg/m2) or more]; records that you had a weight and height measurement [body mass index (BMI)] of 30 kilograms per meter squared [kg/m2] or more within 90 days of starting this drug; records that you had a weight and height measurement [body mass index (BMI)] of 27 kilograms per meter squared [kg/m2] or more within 90 days of starting this drug and you also have at least one of the following health conditions caused by your weight: high blood pressure [either treated hypertension or a systolic blood pressure of 130 millimeters of mercury (mmHg) or greater, or a diastolic blood pressure 80 mmHg or greater], a blood sugar illness called type 2 diabetes mellitus, high cholesterol [either treated dyslipidemia or a low-density lipoprotein (LDL) of 160 milligrams per deciliter (mg/dL) (4.1 millimoles per liter [mmol/L]) or greater, triglycerides 150 mg/dL (1.7 mmol/L) or greater, or highdensity lipoprotein (HDL) less than 40 mg/dL (1 mmol/L) less than 50 mg/dL (1.3 mmol/L)], or sleeping condition where you stop breathing for short times while sleeping called obstructive sleep apnea that is caused by your weight [body mass index (BMI) of 27 kilograms per  meter squared (kg/m2) or more]; records, from within 90 days of this request, that you have reached and continue to have a 5 percent weight loss). We do  not see that this applies to you. We based this decision on your health plans prior authorization clinical criteria named Wegovy  (semaglutide ).  PA #/Case ID/Reference #: [id number]

## 2024-05-20 NOTE — Patient Instructions (Addendum)
 I would get a flu shot each fall.   Tdap and RSV when possible.  Take care.  Glad to see you. Please update me about your weight in about 3 months.

## 2024-05-21 NOTE — Assessment & Plan Note (Signed)
 Prev AAA screening neg.   Tetanus 2020 Flu d/w pt about PNA 2021 Shingles prev done.   covid vaccine 2021 HIV and HCV neg prev.   PSA wnl.  Dw pt.  FH noted.   Colonoscopy 2024 Living will d/w pt.  Wife designated if patient were incapacitated.    Diet and exercise d/w pt.  Encouraged both, discussed.

## 2024-05-21 NOTE — Assessment & Plan Note (Signed)
 He has been on wegovy  2.4mg .  No ADE on med.  He needs PA to continue med.  He has 1 dose left for tomorrow.   He is getting med through [facility].  Rx sent today.  Weight down from prev 288 lbs. A1c 5.8.  Discussed with patient at office visit.  Continue work on diet and exercise.

## 2024-05-21 NOTE — Assessment & Plan Note (Signed)
 Continue work on diet and exercise.  Continue Zetia  and atorvastatin .

## 2024-05-21 NOTE — Assessment & Plan Note (Signed)
 Living will d/w pt.  Wife designated if patient were incapacitated.   ?

## 2024-05-21 NOTE — Assessment & Plan Note (Signed)
 Cough improved with periodic PPI use, used for about 10 days at a time.  I asked him to check with ENT about this, in case his cough could be GERD related.

## 2024-05-21 NOTE — Assessment & Plan Note (Signed)
 Continue work on diet and exercise.  Continue hydrochlorothiazide  and metoprolol .

## 2024-05-21 NOTE — Assessment & Plan Note (Signed)
 Done with augmentin  and sinus sx improved.  He has ENT eval pending.

## 2024-05-23 ENCOUNTER — Encounter: Payer: Self-pay | Admitting: Family Medicine

## 2024-06-10 ENCOUNTER — Ambulatory Visit: Payer: Self-pay | Admitting: Cardiovascular Disease

## 2025-04-07 ENCOUNTER — Other Ambulatory Visit

## 2025-04-14 ENCOUNTER — Encounter: Admitting: Family Medicine

## 2025-04-14 ENCOUNTER — Ambulatory Visit
